# Patient Record
Sex: Male | Born: 1940 | Race: White | Hispanic: No | Marital: Married | Smoking: Never smoker
Health system: Southern US, Community
[De-identification: ages and names within clinical notes are randomized; demographics above are authoritative.]

## PROBLEM LIST (undated history)

## (undated) DIAGNOSIS — G4730 Sleep apnea, unspecified: Secondary | ICD-10-CM

## (undated) DIAGNOSIS — I517 Cardiomegaly: Secondary | ICD-10-CM

## (undated) DIAGNOSIS — M1990 Unspecified osteoarthritis, unspecified site: Secondary | ICD-10-CM

## (undated) DIAGNOSIS — C801 Malignant (primary) neoplasm, unspecified: Secondary | ICD-10-CM

## (undated) DIAGNOSIS — D126 Benign neoplasm of colon, unspecified: Secondary | ICD-10-CM

## (undated) DIAGNOSIS — A159 Respiratory tuberculosis unspecified: Secondary | ICD-10-CM

## (undated) DIAGNOSIS — E785 Hyperlipidemia, unspecified: Secondary | ICD-10-CM

## (undated) DIAGNOSIS — I10 Essential (primary) hypertension: Secondary | ICD-10-CM

## (undated) DIAGNOSIS — F419 Anxiety disorder, unspecified: Secondary | ICD-10-CM

## (undated) HISTORY — DX: Cardiomegaly: I51.7

## (undated) HISTORY — DX: Benign neoplasm of colon, unspecified: D12.6

## (undated) HISTORY — PX: SKIN CANCER EXCISION: SHX779

## (undated) HISTORY — DX: Anxiety disorder, unspecified: F41.9

---

## 2002-06-04 ENCOUNTER — Encounter: Admission: RE | Admit: 2002-06-04 | Discharge: 2002-06-04 | Payer: Self-pay | Admitting: Family Medicine

## 2002-06-04 ENCOUNTER — Encounter: Payer: Self-pay | Admitting: Family Medicine

## 2003-05-11 ENCOUNTER — Emergency Department (HOSPITAL_COMMUNITY): Admission: EM | Admit: 2003-05-11 | Discharge: 2003-05-11 | Payer: Self-pay | Admitting: Emergency Medicine

## 2003-05-16 ENCOUNTER — Ambulatory Visit (HOSPITAL_COMMUNITY): Admission: RE | Admit: 2003-05-16 | Discharge: 2003-05-16 | Payer: Self-pay | Admitting: Otolaryngology

## 2008-01-26 ENCOUNTER — Ambulatory Visit (HOSPITAL_BASED_OUTPATIENT_CLINIC_OR_DEPARTMENT_OTHER): Admission: RE | Admit: 2008-01-26 | Discharge: 2008-01-26 | Payer: Self-pay | Admitting: Family Medicine

## 2008-01-30 ENCOUNTER — Ambulatory Visit: Payer: Self-pay | Admitting: Internal Medicine

## 2009-01-03 ENCOUNTER — Encounter: Payer: Self-pay | Admitting: Gastroenterology

## 2009-01-04 ENCOUNTER — Encounter: Payer: Self-pay | Admitting: Gastroenterology

## 2009-01-09 ENCOUNTER — Encounter: Payer: Self-pay | Admitting: Gastroenterology

## 2009-01-16 ENCOUNTER — Encounter (INDEPENDENT_AMBULATORY_CARE_PROVIDER_SITE_OTHER): Payer: Self-pay | Admitting: *Deleted

## 2009-01-24 ENCOUNTER — Encounter (INDEPENDENT_AMBULATORY_CARE_PROVIDER_SITE_OTHER): Payer: Self-pay | Admitting: *Deleted

## 2009-01-24 ENCOUNTER — Ambulatory Visit: Payer: Self-pay | Admitting: Internal Medicine

## 2009-02-06 ENCOUNTER — Encounter: Payer: Self-pay | Admitting: Gastroenterology

## 2009-02-09 ENCOUNTER — Ambulatory Visit: Payer: Self-pay | Admitting: Gastroenterology

## 2009-02-13 ENCOUNTER — Encounter: Payer: Self-pay | Admitting: Gastroenterology

## 2009-05-10 ENCOUNTER — Encounter: Admission: RE | Admit: 2009-05-10 | Discharge: 2009-05-10 | Payer: Self-pay | Admitting: Endocrinology

## 2009-05-24 ENCOUNTER — Encounter: Admission: RE | Admit: 2009-05-24 | Discharge: 2009-05-24 | Payer: Self-pay | Admitting: Endocrinology

## 2010-03-06 NOTE — Procedures (Signed)
Summary: Colonoscopy  Patient: Danny Hunter Note: All result statuses are Final unless otherwise noted.  Tests: (1) Colonoscopy (COL)   COL Colonoscopy           DONE     [facility]     [address]     [place], [place]  [address]           COLONOSCOPY PROCEDURE REPORT           PATIENT:  Danny Hunter, Danny Hunter  MR#:  [id number]     BIRTHDATE:  May 25, 1940, 68 yrs. old  GENDER:  male           ENDOSCOPIST:  Barbette Hair. Arlyce Dice, MD     Referred by:           PROCEDURE DATE:  02/09/2009     PROCEDURE:  Colonoscopy with snare polypectomy     ASA CLASS:  Class II     INDICATIONS:  Routine Risk Screening           MEDICATIONS:   Fentanyl 50 mcg IV, Versed 5 mg IV           DESCRIPTION OF PROCEDURE:   After the risks benefits and     alternatives of the procedure were thoroughly explained, informed     consent was obtained.  Digital rectal exam was performed and     revealed no abnormalities.   The LB CF-H180AL [id number] endoscope     was introduced through the anus and advanced to the cecum, which     was identified by both the appendix and ileocecal valve, without     limitations.  The quality of the prep was excellent, using     MoviPrep.  The instrument was then slowly withdrawn as the colon     was fully examined.     <<PROCEDUREIMAGES>>           FINDINGS:  A sessile polyp was found in the cecum. It was 2 mm in     size. Polyp was snared without cautery. Retrieval was successful     (see image4). snare polyp  A sessile polyp was found in the     proximal transverse colon. It was 3 mm in size. Polyp was snared     without cautery. Retrieval was successful (see image5). snare     polyp  Scattered diverticula were found (see image7, image10, and     image9). Scattered, moderate diverticulosis from sigmoid to     transverse colon  This was otherwise a normal examination of the     colon (see image1, image2, image13, image14, and image15).     Retroflexed views in the  rectum revealed no abnormalities.    The     scope was then withdrawn from the patient and the procedure     completed.           COMPLICATIONS:  None           ENDOSCOPIC IMPRESSION:     1) 2 mm sessile polyp in the cecum     2) 3 mm sessile polyp in the proximal transverse colon     3) Diverticula, scattered     4) Otherwise normal examination     RECOMMENDATIONS:     1) If the polyp(s) removed today are proven to be adenomatous     (pre-cancerous) polyps, you will need a repeat colonoscopy in 5     years. Otherwise you should continue to follow colorectal  cancer     screening guidelines for "routine risk" patients with colonoscopy     in 10 years.           REPEAT EXAM:  You will receive a letter from Dr. Arlyce Dice in 1-2     weeks, after reviewing the final pathology, with followup     recommendations.           ______________________________     Barbette Hair Arlyce Dice, MD           CC:  Adela Lank, MD           n.     Rosalie Doctor:   Barbette Hair. Kaplan at 02/09/2009 10:58 AM           Page 2 of 3   Fritz, [place], [phone]  Note: An exclamation mark (!) indicates a result that was not dispersed into the flowsheet. Document Creation Date: 02/09/2009 1:00 PM _______________________________________________________________________  (1) Order result status: Final Collection or observation date-time: 02/09/2009 10:50 Requested date-time:  Receipt date-time:  Reported date-time:  Referring Physician:   Ordering Physician: Melvia Heaps [phone]) Specimen Source:  Source: Launa Grill Order Number: [id number] Lab site:   Appended Document: Colonoscopy     Procedures Next Due Date:    Colonoscopy: 02/2014

## 2010-03-06 NOTE — Letter (Signed)
Summary: Patient's history/[facility]  Patient's history/[facility]   Imported By: Sherian Rein 02/23/2009 10:20:53  _____________________________________________________________________  External Attachment:    Type:   Image     Comment:   External Document

## 2010-03-06 NOTE — Letter (Signed)
Summary: Patient Notice- Polyp Results  [facility]  [address] [place], [place] [address]   Phone: [phone]  Fax: [phone]        February 13, 2009 MRN: [id number]    Danny Hunter [address] [place], [place]  [address]    Dear [name],  I am pleased to inform you that the colon polyp(s) removed during your recent colonoscopy was (were) found to be benign (no cancer detected) upon pathologic examination.  I recommend you have a repeat colonoscopy examination in 5_ years to look for recurrent polyps, as having colon polyps increases your risk for having recurrent polyps or even colon cancer in the future.  Should you develop new or worsening symptoms of abdominal pain, bowel habit changes or bleeding from the rectum or bowels, please schedule an evaluation with either your primary care physician or with me.  Additional information/recommendations:  __ No further action with gastroenterology is needed at this time. Please      follow-up with your primary care physician for your other healthcare      needs.  __ Please call [phone] to schedule a return visit to review your      situation.  __ Please keep your follow-up visit as already scheduled.  _x_ Continue treatment plan as outlined the day of your exam.  Please call us if you are having persistent problems or have questions about your condition that have not been fully answered at this time.  Sincerely,  Louis Meckel MD  This letter has been electronically signed by your physician.  Appended Document: Patient Notice- Polyp Results Letter mailed 01.12.11

## 2010-03-06 NOTE — Miscellaneous (Signed)
Summary: movi  Clinical Lists Changes  Medications: Added new medication of MOVIPREP 100 GM  SOLR (PEG-KCL-NACL-NASULF-NA ASC-C) As per prep instructions. - Signed Rx of MOVIPREP 100 GM  SOLR (PEG-KCL-NACL-NASULF-NA ASC-C) As per prep instructions.;  #1 x 0;  Signed;  Entered by: Harlow Mares CMA (AAMA);  Authorized by: Louis Meckel MD;  Method used: Electronically to [facility] [id number] [address] [place], [place], [place]  [address], Ph: [phone], Fax: [phone]    Prescriptions: MOVIPREP 100 GM  SOLR (PEG-KCL-NACL-NASULF-NA ASC-C) As per prep instructions.  #1 x 0   Entered by:   Harlow Mares CMA (AAMA)   Authorized by:   Louis Meckel MD   Signed by:   Harlow Mares CMA (AAMA) on 02/06/2009   Method used:   Electronically to        [facility] [address] (retail)       [address] [place], [place]  [address]       Ph: [phone]       Fax: [phone]   RxID:   [id number]

## 2010-03-06 NOTE — Letter (Signed)
Summary: [facility]  [facility]   Imported By: Sherian Rein 02/23/2009 10:19:40  _____________________________________________________________________  External Attachment:    Type:   Image     Comment:   External Document

## 2010-03-20 ENCOUNTER — Other Ambulatory Visit: Payer: Self-pay | Admitting: Otolaryngology

## 2010-03-29 ENCOUNTER — Other Ambulatory Visit: Payer: Self-pay | Admitting: Endocrinology

## 2010-03-29 DIAGNOSIS — E041 Nontoxic single thyroid nodule: Secondary | ICD-10-CM

## 2010-04-22 LAB — GLUCOSE, CAPILLARY
Glucose-Capillary: 133 mg/dL — ABNORMAL HIGH (ref 70–99)
Glucose-Capillary: 160 mg/dL — ABNORMAL HIGH (ref 70–99)

## 2010-05-14 ENCOUNTER — Ambulatory Visit
Admission: RE | Admit: 2010-05-14 | Discharge: 2010-05-14 | Disposition: A | Discharge status: Home / Self Care | Payer: Medicare Other | Source: Ambulatory Visit | Attending: Endocrinology | Admitting: Endocrinology

## 2010-05-14 DIAGNOSIS — E041 Nontoxic single thyroid nodule: Secondary | ICD-10-CM

## 2010-06-22 NOTE — Procedures (Signed)
NAME:  Danny Hunter, Danny Hunter             ACCOUNT NO.:  [id number]   MEDICAL RECORD NO.:  [id number]          PATIENT TYPE:  OUT   LOCATION:  SLEEP CENTER                 FACILITY[facility]   PHYSICIAN:  Clinton D. Maple Hudson, MD, FCCP, FACPDATE OF BIRTH:  July 22, 1939   DATE OF STUDY:                            NOCTURNAL POLYSOMNOGRAM   REFERRING PHYSICIAN:  Evelena Peat, M.D.   INDICATION FOR STUDY:  Hypersomnia with sleep apnea.   EPWORTH SLEEPINESS SCORE:  Epworth sleepiness score 7/24.  BMI 36.  Weight 230 pounds.  Height 67 inches.  Neck 17.5 inches.   MEDICATIONS:  Home medications charted and reviewed.   SLEEP ARCHITECTURE:  Split study protocol.  During the diagnostic phase,  total sleep time 121 minutes with sleep efficiency 82%.  Stage I was  14%.  Stage II 86%.  Stage III and REM were absent.  Sleep latency 16  minutes.  Wake after sleep onset 12.5 minutes.  Arousal index 38.2  indicating increased EEG arousal.  No bedtime medication was taken.   RESPIRATORY DATA:  Split study protocol.  Apnea-hypopnea index (AHI)  37.2 per hour.  A total of 75 events were scored including 29  obstructive apneas, 1 mixed apnea, and 45 hypopneas.  Events were non-  positional, but somewhat more frequent while supine.  CPAP was titrated  to optimal control at 7 CWP, AHI 0 per hour.  The technician noted a few  residual breakthrough events, mostly hypopneas, and tried higher  pressures, which did not improve control and appeared to be associated  with increasing nasal congestion.  A pressure of 11 CWP provided an AHI  of 2.6 per hour during 46 minutes of measurement and is the recommended  pressure for initial home trial.  Patient chose a medium ResMed Quattro  mask with heated humidifier.   OXYGEN DATA:  Moderately loud snoring with oxygen desaturation to a  nadir of 84% on room air before CPAP.  After CPAP control, mean oxygen  saturation held 91.9% on room air.   CARDIAC DATA:  Sinus rhythm  with occasional PVC.   MOVEMENT/PARASOMNIA:  No significant movement disturbance.  Bathroom x1.   IMPRESSION/RECOMMENDATION:  1. Moderate obstructive sleep apnea/hypopnea syndrome, apnea-hypopnea      index 37.2 per hour.  Non-positional events somewhat more frequent      while supine.  Moderately loud snoring with oxygen desaturation to      a nadir of 84%.  2. Successful continuous positive airway pressure titration to an      initial recommended home pressure trial at 11 centimeters of water      pressure, apnea-hypopnea index 2.6 per hour.  He chose a medium      ResMed Quattro full face mask with heated humidifier.  There were a      few residual breakthrough events, but higher pressures were not      helpful and this seemed associated with increasing nasal      congestion.  3. Mean oxygen saturation on room air with continuous positive airway      pressure control was only 91.9% with maximum 96% and minimum 84%      while  on continuous positive airway pressure.  A total of 0.5      minutes was recorded with saturation less than 88% on room air      while wearing continuous positive airway pressure.  Question      underlying cardiopulmonary disease based on low mean oxygen      saturation, but noting that awake room air baseline saturation was      97% at rest.      Clinton D. Maple Hudson, MD, [facility], FACP  Diplomate, Biomedical engineer of Sleep Medicine  Electronically Signed     CDY/MEDQ  D:  01/31/2008 10:47:50  T:  02/01/2008 01:15:27  Job:  [id number]

## 2011-07-18 ENCOUNTER — Other Ambulatory Visit (HOSPITAL_COMMUNITY): Payer: Self-pay | Admitting: Orthopedic Surgery

## 2011-07-18 DIAGNOSIS — M25519 Pain in unspecified shoulder: Secondary | ICD-10-CM

## 2011-07-26 ENCOUNTER — Ambulatory Visit (HOSPITAL_COMMUNITY)
Admission: RE | Admit: 2011-07-26 | Discharge: 2011-07-26 | Disposition: A | Discharge status: Home / Self Care | Payer: Medicare Other | Source: Ambulatory Visit | Attending: Orthopedic Surgery | Admitting: Orthopedic Surgery

## 2011-07-26 DIAGNOSIS — M25519 Pain in unspecified shoulder: Secondary | ICD-10-CM

## 2011-07-26 DIAGNOSIS — M19019 Primary osteoarthritis, unspecified shoulder: Secondary | ICD-10-CM | POA: Insufficient documentation

## 2011-09-26 ENCOUNTER — Other Ambulatory Visit: Payer: Self-pay | Admitting: Orthopedic Surgery

## 2011-10-08 ENCOUNTER — Encounter (HOSPITAL_COMMUNITY): Payer: Self-pay | Admitting: Respiratory Therapy

## 2011-10-17 ENCOUNTER — Encounter (HOSPITAL_COMMUNITY): Admission: RE | Admit: 2011-10-17 | Payer: Medicare Other | Source: Ambulatory Visit

## 2011-10-17 ENCOUNTER — Encounter (HOSPITAL_COMMUNITY)
Admission: RE | Admit: 2011-10-17 | Discharge: 2011-10-17 | Disposition: A | Discharge status: Home / Self Care | Payer: Medicare Other | Source: Ambulatory Visit | Attending: Orthopedic Surgery | Admitting: Orthopedic Surgery

## 2011-10-17 ENCOUNTER — Encounter (HOSPITAL_COMMUNITY): Payer: Self-pay

## 2011-10-17 HISTORY — DX: Hyperlipidemia, unspecified: E78.5

## 2011-10-17 HISTORY — DX: Sleep apnea, unspecified: G47.30

## 2011-10-17 HISTORY — DX: Essential (primary) hypertension: I10

## 2011-10-17 LAB — URINALYSIS, ROUTINE W REFLEX MICROSCOPIC
Glucose, UA: NEGATIVE mg/dL
Hgb urine dipstick: NEGATIVE
Specific Gravity, Urine: 1.017 (ref 1.005–1.030)
Urobilinogen, UA: 1 mg/dL (ref 0.0–1.0)

## 2011-10-17 LAB — COMPREHENSIVE METABOLIC PANEL
ALT: 17 U/L (ref 0–53)
Alkaline Phosphatase: 64 U/L (ref 39–117)
CO2: 30 mEq/L (ref 19–32)
GFR calc Af Amer: >90 mL/min (ref >90)
GFR calc non Af Amer: 89 mL/min — ABNORMAL LOW (ref >90)
Glucose, Bld: 144 mg/dL — ABNORMAL HIGH (ref 70–99)
Potassium: 4.1 mEq/L (ref 3.5–5.1)
Sodium: 144 mEq/L (ref 135–145)
Total Bilirubin: 0.5 mg/dL (ref 0.3–1.2)

## 2011-10-17 LAB — CBC WITH DIFFERENTIAL/PLATELET
Basophils Absolute: 0 10*3/uL (ref 0.0–0.1)
Basophils Relative: 0 % (ref 0–1)
Eosinophils Absolute: 0.2 10*3/uL (ref 0.0–0.7)
Eosinophils Relative: 2 % (ref 0–5)
HCT: 45.7 % (ref 39.0–52.0)
MCH: 31 pg (ref 26.0–34.0)
MCHC: 33.3 g/dL (ref 30.0–36.0)
Monocytes Absolute: 0.7 10*3/uL (ref 0.1–1.0)
Monocytes Relative: 10 % (ref 3–12)
Neutro Abs: 4.9 10*3/uL (ref 1.7–7.7)
RDW: 13.4 % (ref 11.5–15.5)

## 2011-10-17 LAB — APTT: aPTT: 29 seconds (ref 24–37)

## 2011-10-17 LAB — ABO/RH: ABO/RH(D): O POS

## 2011-10-17 LAB — SURGICAL PCR SCREEN: Staphylococcus aureus: NEGATIVE

## 2011-10-17 NOTE — Pre-Procedure Instructions (Signed)
19 Danny Hunter  10/17/2011   Your procedure is scheduled on:  Sept 17, 2013  Report to [facility] at 5:30 AM.  Call this number if you have problems the morning of surgery: [phone]   Remember:   Do not eat food:After Midnight.    Take these medicines the morning of surgery with A SIP OF WATER: metoprolol   Do not wear jewelry, make-up or nail polish.  Do not wear lotions, powders, or perfumes. You may wear deodorant.  Do not shave 48 hours prior to surgery. Men may shave face and neck.  Do not bring valuables to the hospital.  Contacts, dentures or bridgework may not be worn into surgery.  Leave suitcase in the car. After surgery it may be brought to your room.  For patients admitted to the hospital, checkout time is 11:00 AM the day of discharge.   Patients discharged the day of surgery will not be allowed to drive home.  Name and phone number of your driver:   Special Instructions: Incentive Spirometry - Practice and bring it with you on the day of surgery. and CHG Shower Use Special Wash: 1/2 bottle night before surgery and 1/2 bottle morning of surgery.   Please read over the following fact sheets that you were given: Pain Booklet, Coughing and Deep Breathing, Blood Transfusion Information and Surgical Site Infection Prevention

## 2011-10-18 NOTE — Consult Note (Signed)
Anesthesia Chart Review:   Patient is a 71 year old male scheduled for right total shoulder arthroplasty on 10/22/11 by Dr. Ave Filter.  History includes non-smoker, obesity, HTN, HLD, DM2, OSA.    Patient was seen by Dr. Viann Fish for a pre-operative Cardiology evaluation on 09/30/11.  EKG then showed NSR.  He ordered an echo that showed moderate LVH with normal systolic function, EF 60%, mild left atrial enlargement, Doppler evidence of grade 1 diastolic dysfunction, mild aortic root enlargement.  He felt patient would be acceptable to proceed and at average risk for a man his age.  CXR done on 09/20/11 [facility], Dr. Juleen China) showed stable mild cardiomegaly. 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 No active lung disease.   Labs noted.  Anticipate patient can proceed as planned.  Shonna Chock, PA-C

## 2011-10-21 MED ORDER — DEXTROSE 5 % IV SOLN
3.0000 g | INTRAVENOUS | Status: AC
  Administered 2011-10-22: 3 g via INTRAVENOUS
  Filled 2011-10-21: qty 3000

## 2011-10-22 ENCOUNTER — Inpatient Hospital Stay (HOSPITAL_COMMUNITY): Payer: Medicare Other | Admitting: Vascular Surgery

## 2011-10-22 ENCOUNTER — Encounter (HOSPITAL_COMMUNITY): Payer: Self-pay | Admitting: Vascular Surgery

## 2011-10-22 ENCOUNTER — Observation Stay (HOSPITAL_COMMUNITY): Payer: Medicare Other

## 2011-10-22 ENCOUNTER — Encounter (HOSPITAL_COMMUNITY)
Admission: RE | Disposition: A | Discharge status: Home / Self Care | Payer: Self-pay | Source: Ambulatory Visit | Attending: Orthopedic Surgery

## 2011-10-22 ENCOUNTER — Inpatient Hospital Stay (HOSPITAL_COMMUNITY)
Admission: RE | Admit: 2011-10-22 | Discharge: 2011-10-24 | DRG: 484 | Disposition: A | Discharge status: Home / Self Care | Payer: Medicare Other | Source: Ambulatory Visit | Attending: Orthopedic Surgery | Admitting: Orthopedic Surgery

## 2011-10-22 ENCOUNTER — Encounter (HOSPITAL_COMMUNITY): Payer: Self-pay | Admitting: *Deleted

## 2011-10-22 DIAGNOSIS — E119 Type 2 diabetes mellitus without complications: Secondary | ICD-10-CM | POA: Diagnosis present

## 2011-10-22 DIAGNOSIS — M19019 Primary osteoarthritis, unspecified shoulder: Secondary | ICD-10-CM

## 2011-10-22 DIAGNOSIS — Z01812 Encounter for preprocedural laboratory examination: Secondary | ICD-10-CM

## 2011-10-22 DIAGNOSIS — I10 Essential (primary) hypertension: Secondary | ICD-10-CM | POA: Diagnosis present

## 2011-10-22 DIAGNOSIS — E785 Hyperlipidemia, unspecified: Secondary | ICD-10-CM | POA: Diagnosis present

## 2011-10-22 DIAGNOSIS — G4730 Sleep apnea, unspecified: Secondary | ICD-10-CM | POA: Diagnosis present

## 2011-10-22 DIAGNOSIS — Z96619 Presence of unspecified artificial shoulder joint: Secondary | ICD-10-CM

## 2011-10-22 DIAGNOSIS — E669 Obesity, unspecified: Secondary | ICD-10-CM | POA: Diagnosis present

## 2011-10-22 DIAGNOSIS — Z79899 Other long term (current) drug therapy: Secondary | ICD-10-CM

## 2011-10-22 DIAGNOSIS — Z01811 Encounter for preprocedural respiratory examination: Secondary | ICD-10-CM

## 2011-10-22 HISTORY — PX: TOTAL SHOULDER ARTHROPLASTY: SHX126

## 2011-10-22 LAB — GLUCOSE, CAPILLARY
Glucose-Capillary: 121 mg/dL — ABNORMAL HIGH (ref 70–99)
Glucose-Capillary: 152 mg/dL — ABNORMAL HIGH (ref 70–99)
Glucose-Capillary: 176 mg/dL — ABNORMAL HIGH (ref 70–99)
Glucose-Capillary: 141 mg/dL — ABNORMAL HIGH (ref 70–99)

## 2011-10-22 SURGERY — ARTHROPLASTY, SHOULDER, TOTAL
Anesthesia: General | Site: Shoulder | Laterality: Right | Wound class: Clean

## 2011-10-22 MED ORDER — OXYCODONE HCL 5 MG PO TABS: 5.0000 mg | ORAL_TABLET | Freq: Once | ORAL | Status: DC | PRN

## 2011-10-22 MED ORDER — MIDAZOLAM HCL 5 MG/5ML IJ SOLN
INTRAMUSCULAR | Status: DC | PRN
  Administered 2011-10-22: 1 mg via INTRAVENOUS

## 2011-10-22 MED ORDER — LOSARTAN POTASSIUM 50 MG PO TABS
50.0000 mg | ORAL_TABLET | Freq: Every day | ORAL | Status: DC
  Administered 2011-10-22 – 2011-10-24 (×3): 50 mg via ORAL
  Filled 2011-10-22 (×3): qty 1

## 2011-10-22 MED ORDER — ACETAMINOPHEN 10 MG/ML IV SOLN
INTRAVENOUS | Status: DC | PRN
  Administered 2011-10-22: 1000 mg via INTRAVENOUS

## 2011-10-22 MED ORDER — ACETAMINOPHEN 325 MG PO TABS: 650.0000 mg | ORAL_TABLET | Freq: Four times a day (QID) | ORAL | Status: DC | PRN

## 2011-10-22 MED ORDER — ONDANSETRON HCL 4 MG/2ML IJ SOLN
4.0000 mg | Freq: Four times a day (QID) | INTRAMUSCULAR | Status: DC | PRN
  Administered 2011-10-23: 4 mg via INTRAVENOUS
  Filled 2011-10-22: qty 2

## 2011-10-22 MED ORDER — OXYCODONE HCL 5 MG/5ML PO SOLN: 5.0000 mg | Freq: Once | ORAL | Status: DC | PRN

## 2011-10-22 MED ORDER — LIDOCAINE HCL (CARDIAC) 20 MG/ML IV SOLN
INTRAVENOUS | Status: DC | PRN
  Administered 2011-10-22: 80 mg via INTRAVENOUS

## 2011-10-22 MED ORDER — ONDANSETRON HCL 4 MG/2ML IJ SOLN
INTRAMUSCULAR | Status: DC | PRN
  Administered 2011-10-22: 4 mg via INTRAVENOUS

## 2011-10-22 MED ORDER — ROCURONIUM BROMIDE 100 MG/10ML IV SOLN
INTRAVENOUS | Status: DC | PRN
  Administered 2011-10-22: 50 mg via INTRAVENOUS

## 2011-10-22 MED ORDER — POVIDONE-IODINE 7.5 % EX SOLN
Freq: Once | CUTANEOUS | Status: DC
  Filled 2011-10-22: qty 118

## 2011-10-22 MED ORDER — LACTATED RINGERS IV SOLN
INTRAVENOUS | Status: DC | PRN
  Administered 2011-10-22 (×3): via INTRAVENOUS

## 2011-10-22 MED ORDER — MORPHINE SULFATE 2 MG/ML IJ SOLN
2.0000 mg | INTRAMUSCULAR | Status: DC | PRN
  Administered 2011-10-22: 2 mg via INTRAVENOUS

## 2011-10-22 MED ORDER — EPHEDRINE SULFATE 50 MG/ML IJ SOLN
INTRAMUSCULAR | Status: DC | PRN
  Administered 2011-10-22: 5 mg via INTRAVENOUS
  Administered 2011-10-22: 10 mg via INTRAVENOUS
  Administered 2011-10-22 (×2): 5 mg via INTRAVENOUS

## 2011-10-22 MED ORDER — ATORVASTATIN CALCIUM 20 MG PO TABS
20.0000 mg | ORAL_TABLET | Freq: Every day | ORAL | Status: DC
  Administered 2011-10-22 – 2011-10-23 (×2): 20 mg via ORAL
  Filled 2011-10-22 (×3): qty 1

## 2011-10-22 MED ORDER — ONDANSETRON HCL 4 MG PO TABS: 4.0000 mg | ORAL_TABLET | Freq: Four times a day (QID) | ORAL | Status: DC | PRN

## 2011-10-22 MED ORDER — HYDROMORPHONE HCL PF 1 MG/ML IJ SOLN: 0.2500 mg | INTRAMUSCULAR | Status: DC | PRN

## 2011-10-22 MED ORDER — MORPHINE SULFATE 2 MG/ML IJ SOLN
INTRAMUSCULAR | Status: AC
  Filled 2011-10-22: qty 1

## 2011-10-22 MED ORDER — SODIUM CHLORIDE 0.9 % IV SOLN
INTRAVENOUS | Status: DC
  Administered 2011-10-22 – 2011-10-23 (×2): via INTRAVENOUS
  Administered 2011-10-24: 125 mL via INTRAVENOUS

## 2011-10-22 MED ORDER — NEOSTIGMINE METHYLSULFATE 1 MG/ML IJ SOLN
INTRAMUSCULAR | Status: DC | PRN
  Administered 2011-10-22: 3 mg via INTRAVENOUS

## 2011-10-22 MED ORDER — FUROSEMIDE 20 MG PO TABS
20.0000 mg | ORAL_TABLET | Freq: Every day | ORAL | Status: DC
  Administered 2011-10-24: 20 mg via ORAL
  Filled 2011-10-22 (×3): qty 1

## 2011-10-22 MED ORDER — METOCLOPRAMIDE HCL 5 MG/ML IJ SOLN
5.0000 mg | Freq: Three times a day (TID) | INTRAMUSCULAR | Status: DC | PRN
  Administered 2011-10-23: 10 mg via INTRAVENOUS
  Filled 2011-10-22: qty 2

## 2011-10-22 MED ORDER — OXYCODONE-ACETAMINOPHEN 5-325 MG PO TABS
1.0000 | ORAL_TABLET | ORAL | Status: DC | PRN
  Administered 2011-10-23 (×2): 2 via ORAL
  Filled 2011-10-22 (×2): qty 2

## 2011-10-22 MED ORDER — 0.9 % SODIUM CHLORIDE (POUR BTL) OPTIME
TOPICAL | Status: DC | PRN
  Administered 2011-10-22: 1000 mL

## 2011-10-22 MED ORDER — PROPOFOL 10 MG/ML IV BOLUS
INTRAVENOUS | Status: DC | PRN
  Administered 2011-10-22: 150 mg via INTRAVENOUS
  Administered 2011-10-22: 40 mg via INTRAVENOUS

## 2011-10-22 MED ORDER — BUPIVACAINE-EPINEPHRINE PF 0.5-1:200000 % IJ SOLN
INTRAMUSCULAR | Status: DC | PRN
  Administered 2011-10-22: 25 mL

## 2011-10-22 MED ORDER — BISACODYL 5 MG PO TBEC: 5.0000 mg | DELAYED_RELEASE_TABLET | Freq: Every day | ORAL | Status: DC | PRN

## 2011-10-22 MED ORDER — FLEET ENEMA 7-19 GM/118ML RE ENEM: 1.0000 | ENEMA | Freq: Once | RECTAL | Status: AC | PRN

## 2011-10-22 MED ORDER — MEPERIDINE HCL 25 MG/ML IJ SOLN: 6.2500 mg | INTRAMUSCULAR | Status: DC | PRN

## 2011-10-22 MED ORDER — DIPHENHYDRAMINE HCL 12.5 MG/5ML PO ELIX: 12.5000 mg | ORAL_SOLUTION | ORAL | Status: DC | PRN

## 2011-10-22 MED ORDER — ACETAMINOPHEN 10 MG/ML IV SOLN
INTRAVENOUS | Status: AC
  Filled 2011-10-22: qty 100

## 2011-10-22 MED ORDER — ZOLPIDEM TARTRATE 5 MG PO TABS: 5.0000 mg | ORAL_TABLET | Freq: Every evening | ORAL | Status: DC | PRN

## 2011-10-22 MED ORDER — CEFAZOLIN SODIUM-DEXTROSE 2-3 GM-% IV SOLR
2.0000 g | Freq: Four times a day (QID) | INTRAVENOUS | Status: AC
Start: 2011-10-22 — End: 2011-10-23
  Administered 2011-10-22 – 2011-10-23 (×3): 2 g via INTRAVENOUS
  Filled 2011-10-22 (×3): qty 50

## 2011-10-22 MED ORDER — ACETAMINOPHEN 650 MG RE SUPP: 650.0000 mg | Freq: Four times a day (QID) | RECTAL | Status: DC | PRN

## 2011-10-22 MED ORDER — HEMOSTATIC AGENTS (NO CHARGE) OPTIME
TOPICAL | Status: DC | PRN
  Administered 2011-10-22: 1

## 2011-10-22 MED ORDER — FENTANYL CITRATE 0.05 MG/ML IJ SOLN
INTRAMUSCULAR | Status: DC | PRN
  Administered 2011-10-22: 50 ug via INTRAVENOUS
  Administered 2011-10-22: 25 ug via INTRAVENOUS
  Administered 2011-10-22 (×2): 50 ug via INTRAVENOUS

## 2011-10-22 MED ORDER — PROMETHAZINE HCL 25 MG/ML IJ SOLN: 6.2500 mg | INTRAMUSCULAR | Status: DC | PRN

## 2011-10-22 MED ORDER — SENNOSIDES-DOCUSATE SODIUM 8.6-50 MG PO TABS: 1.0000 | ORAL_TABLET | Freq: Every evening | ORAL | Status: DC | PRN

## 2011-10-22 MED ORDER — INSULIN ASPART 100 UNIT/ML ~~LOC~~ SOLN
0.0000 [IU] | Freq: Three times a day (TID) | SUBCUTANEOUS | Status: DC
  Administered 2011-10-22: 3 [IU] via SUBCUTANEOUS
  Administered 2011-10-23: 5 [IU] via SUBCUTANEOUS
  Administered 2011-10-23: 3 [IU] via SUBCUTANEOUS
  Administered 2011-10-24: 2 [IU] via SUBCUTANEOUS

## 2011-10-22 MED ORDER — ALUM & MAG HYDROXIDE-SIMETH 200-200-20 MG/5ML PO SUSP: 30.0000 mL | ORAL | Status: DC | PRN

## 2011-10-22 MED ORDER — MENTHOL 3 MG MT LOZG: 1.0000 | LOZENGE | OROMUCOSAL | Status: DC | PRN

## 2011-10-22 MED ORDER — SENNA 8.6 MG PO TABS
1.0000 | ORAL_TABLET | Freq: Two times a day (BID) | ORAL | Status: DC
  Administered 2011-10-22 – 2011-10-24 (×4): 8.6 mg via ORAL
  Filled 2011-10-22 (×6): qty 1

## 2011-10-22 MED ORDER — ASPIRIN EC 325 MG PO TBEC
325.0000 mg | DELAYED_RELEASE_TABLET | Freq: Two times a day (BID) | ORAL | Status: DC
  Filled 2011-10-22 (×6): qty 1

## 2011-10-22 MED ORDER — METOCLOPRAMIDE HCL 10 MG PO TABS: 5.0000 mg | ORAL_TABLET | Freq: Three times a day (TID) | ORAL | Status: DC | PRN

## 2011-10-22 MED ORDER — SODIUM CHLORIDE 0.9 % IR SOLN
Status: DC | PRN
  Administered 2011-10-22: 3000 mL

## 2011-10-22 MED ORDER — METOPROLOL TARTRATE 25 MG PO TABS
25.0000 mg | ORAL_TABLET | Freq: Every day | ORAL | Status: DC
  Administered 2011-10-22 – 2011-10-23 (×2): 25 mg via ORAL
  Filled 2011-10-22 (×2): qty 1

## 2011-10-22 MED ORDER — INSULIN ASPART 100 UNIT/ML ~~LOC~~ SOLN
4.0000 [IU] | Freq: Three times a day (TID) | SUBCUTANEOUS | Status: DC
  Administered 2011-10-23: 4 [IU] via SUBCUTANEOUS

## 2011-10-22 MED ORDER — HYDROCODONE-ACETAMINOPHEN 5-325 MG PO TABS
1.0000 | ORAL_TABLET | ORAL | Status: DC | PRN
  Administered 2011-10-22 – 2011-10-23 (×3): 2 via ORAL
  Filled 2011-10-22 (×3): qty 2

## 2011-10-22 MED ORDER — PHENOL 1.4 % MT LIQD: 1.0000 | OROMUCOSAL | Status: DC | PRN

## 2011-10-22 MED ORDER — GLYCOPYRROLATE 0.2 MG/ML IJ SOLN
INTRAMUSCULAR | Status: DC | PRN
  Administered 2011-10-22: 0.4 mg via INTRAVENOUS

## 2011-10-22 SURGICAL SUPPLY — 69 items
BLADE SAW SAG 73X25 THK (BLADE) ×1
BLADE SAW SGTL 73X25 THK (BLADE) ×1 IMPLANT
BLADE SURG 15 STRL LF DISP TIS (BLADE) ×2 IMPLANT
BLADE SURG 15 STRL SS (BLADE) ×2
BOWL SMART MIX CTS (DISPOSABLE) IMPLANT
CEMENT BONE DEPUY (Cement) ×2 IMPLANT
CHLORAPREP W/TINT 26ML (MISCELLANEOUS) ×2 IMPLANT
CLOTH BEACON ORANGE TIMEOUT ST (SAFETY) ×2 IMPLANT
CLSR STERI-STRIP ANTIMIC 1/2X4 (GAUZE/BANDAGES/DRESSINGS) ×2 IMPLANT
COVER SURGICAL LIGHT HANDLE (MISCELLANEOUS) ×2 IMPLANT
DRAPE INCISE IOBAN 66X45 STRL (DRAPES) ×2 IMPLANT
DRAPE SURG 17X23 STRL (DRAPES) ×2 IMPLANT
DRAPE U-SHAPE 47X51 STRL (DRAPES) ×2 IMPLANT
DRSG ADAPTIC 3X8 NADH LF (GAUZE/BANDAGES/DRESSINGS) ×2 IMPLANT
DRSG MEPILEX BORDER 4X4 (GAUZE/BANDAGES/DRESSINGS) ×2 IMPLANT
DRSG MEPILEX BORDER 4X8 (GAUZE/BANDAGES/DRESSINGS) ×2 IMPLANT
DRSG PAD ABDOMINAL 8X10 ST (GAUZE/BANDAGES/DRESSINGS) ×4 IMPLANT
ELECT BLADE 4.0 EZ CLEAN MEGAD (MISCELLANEOUS) ×2
ELECT REM PT RETURN 9FT ADLT (ELECTROSURGICAL) ×2
ELECTRODE BLDE 4.0 EZ CLN MEGD (MISCELLANEOUS) ×1 IMPLANT
ELECTRODE REM PT RTRN 9FT ADLT (ELECTROSURGICAL) ×1 IMPLANT
EVACUATOR 1/8 PVC DRAIN (DRAIN) ×2 IMPLANT
GLOVE BIO SURGEON STRL SZ7 (GLOVE) ×2 IMPLANT
GLOVE BIO SURGEON STRL SZ7.5 (GLOVE) ×2 IMPLANT
GLOVE BIOGEL PI IND STRL 8 (GLOVE) ×1 IMPLANT
GLOVE BIOGEL PI INDICATOR 8 (GLOVE) ×1
GOWN PREVENTION PLUS LG XLONG (DISPOSABLE) ×2 IMPLANT
GOWN STRL NON-REIN LRG LVL3 (GOWN DISPOSABLE) ×4 IMPLANT
GOWN STRL REIN XL XLG (GOWN DISPOSABLE) ×4 IMPLANT
HANDPIECE INTERPULSE COAX TIP (DISPOSABLE) ×1
HEMOSTAT SNOW SURGICEL 2X4 (HEMOSTASIS) ×2 IMPLANT
HEMOSTAT SURGICEL 2X14 (HEMOSTASIS) ×2 IMPLANT
HOOD PEEL AWAY FACE SHEILD DIS (HOOD) ×4 IMPLANT
KIT BASIN OR (CUSTOM PROCEDURE TRAY) ×4 IMPLANT
KIT ROOM TURNOVER OR (KITS) ×2 IMPLANT
MANIFOLD NEPTUNE II (INSTRUMENTS) ×2 IMPLANT
NEEDLE HYPO 25GX1X1/2 BEV (NEEDLE) ×2 IMPLANT
NEEDLE MAYO TROCAR (NEEDLE) ×2 IMPLANT
NS IRRIG 1000ML POUR BTL (IV SOLUTION) ×2 IMPLANT
PACK SHOULDER (CUSTOM PROCEDURE TRAY) ×2 IMPLANT
PAD ARMBOARD 7.5X6 YLW CONV (MISCELLANEOUS) ×4 IMPLANT
RETRIEVER SUT HEWSON (MISCELLANEOUS) IMPLANT
SET HNDPC FAN SPRY TIP SCT (DISPOSABLE) ×1 IMPLANT
SLING ARM IMMOBILIZER LRG (SOFTGOODS) ×2 IMPLANT
SLING ARM IMMOBILIZER MED (SOFTGOODS) IMPLANT
SMARTMIX MINI TOWER (MISCELLANEOUS) ×2
SPONGE GAUZE 4X4 12PLY (GAUZE/BANDAGES/DRESSINGS) ×2 IMPLANT
SPONGE LAP 18X18 X RAY DECT (DISPOSABLE) ×2 IMPLANT
SPONGE LAP 4X18 X RAY DECT (DISPOSABLE) ×6 IMPLANT
STRIP CLOSURE SKIN 1/2X4 (GAUZE/BANDAGES/DRESSINGS) ×2 IMPLANT
SUCTION FRAZIER TIP 10 FR DISP (SUCTIONS) ×2 IMPLANT
SUPPORT WRAP ARM LG (MISCELLANEOUS) ×2 IMPLANT
SUT ETHIBOND NAB CT1 #1 30IN (SUTURE) ×2 IMPLANT
SUT FIBERWIRE #2 38 T-5 BLUE (SUTURE) ×2
SUT MNCRL AB 4-0 PS2 18 (SUTURE) ×2 IMPLANT
SUT SILK 2 0 TIES 17X18 (SUTURE) ×1
SUT SILK 2-0 18XBRD TIE BLK (SUTURE) ×1 IMPLANT
SUT VIC AB 0 CTB1 27 (SUTURE) ×2 IMPLANT
SUT VIC AB 2-0 CT1 27 (SUTURE) ×1
SUT VIC AB 2-0 CT1 TAPERPNT 27 (SUTURE) ×1 IMPLANT
SUTURE FIBERWR #2 38 T-5 BLUE (SUTURE) ×1 IMPLANT
SYR CONTROL 10ML LL (SYRINGE) ×2 IMPLANT
SYRINGE TOOMEY DISP (SYRINGE) ×2 IMPLANT
TOWEL OR 17X24 6PK STRL BLUE (TOWEL DISPOSABLE) ×2 IMPLANT
TOWEL OR 17X26 10 PK STRL BLUE (TOWEL DISPOSABLE) ×2 IMPLANT
TOWER SMARTMIX MINI (MISCELLANEOUS) ×1 IMPLANT
TRAY FOLEY CATH 14FR (SET/KITS/TRAYS/PACK) IMPLANT
WATER STERILE IRR 1000ML POUR (IV SOLUTION) ×2 IMPLANT
YANKAUER SUCT BULB TIP NO VENT (SUCTIONS) ×2 IMPLANT

## 2011-10-22 NOTE — Progress Notes (Signed)
After assisting pt to bathroom, pt's hemovac drain was accidentally pulled out.  Additional mepilex dressing applied to site, will continue to monitor.

## 2011-10-22 NOTE — Progress Notes (Signed)
RT went to help PT with his home cpap machine after RN called but when RT got to PT room, PT had changed his mind and said he was going to pass on wearing his machine for the night. Rt advised PT if he changed his mind to let us know. RT will continue to monitor.

## 2011-10-22 NOTE — Op Note (Signed)
Procedure(s): TOTAL SHOULDER ARTHROPLASTY Procedure Note  Danny Hunter male 71 y.o. 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 Pain and dysfunction interfered with quality of life and nonoperative treatment with activity modification, NSAIDS and injections failed.     Surgeon: Mable Paris   Assistants: Damita Lack PA-C (Danielle was scrubbed throughout the procedure and was essential in exposure retraction and closure)  Anesthesia: General endotracheal anesthesia with preoperative interscalene block given by the attending anesthesiologist   Procedure Detail  TOTAL SHOULDER ARTHROPLASTY  Findings: Severe arthritis with large osteophytes of the humeral head and several large loose osteochondral fragments. DePuy total shoulder replacement with a size 14 press-fit porcoat stem with a size 52 x 21 eccentric head and a size 56 glenoid. The glenoid was cemented anchor peg glenoid. Excellent stability was noted.  Estimated Blood Loss:  300 mL         Drains: 1 medium hemovac  Blood Given: none          Specimens: none        Complications:  * No complications entered in OR log *         Disposition: PACU - hemodynamically stable.         Condition: stable    Procedure:   The patient was identified in the preoperative holding area where I personally marked the operative extremity after verifying with the patient and consent. He  was taken to the operating room where He was transferred to the   operative table.  The patient received an interscalene block in   the holding area by the attending anesthesiologist.  General anesthesia was induced   in the operating room without complication.  The patient did receive IV  Ancef prior to  the commencement of the procedure.  The patient was   placed in the beach-chair position with the back raised about 30   degrees.  The nonoperative extremity and head and neck were carefully   positioned and padded protecting against neurovascular compromise.  The   left upper extremity was then prepped and draped in the standard sterile   fashion.    The appropriate operative time-out was performed with   Anesthesia, the perioperative staff, as well as myself and we all agreed   that the right side was the correct operative site.  An approximately   10 cm incision was made from the tip of the coracoid to the center point of the   humerus at the level of the axilla.  Dissection was carried down sharply   through subcutaneous tissues and cephalic vein was identified and taken   laterally with the deltoid.  The pectoralis major was taken medially.  The   upper 1 cm of the pectoralis major was released from its attachment on   the humerus.  The clavipectoral fascia was incised just lateral to the   conjoined tendon.  This incision was carried up to but not into the   coracoacromial ligament.  Digital palpation was used to prove   integrity of the axillary nerve which was protected throughout the   procedure.  Musculocutaneous nerve was not palpated in the operative   field.  Conjoined tendon was then retracted gently medially and the   deltoid laterally.  Anterior circumflex humeral vessels were clamped and   coagulated.  The soft tissues overlying the biceps was incised and this   incision was carried across the transverse humeral ligament to the base   of the coracoid.  The biceps was tenodesed to the soft tissue just above   pectoralis major and the remaining portion of the biceps superiorly was   excised.  An osteotomy was performed at the lesser tuberosity and the   subscapularis was freed from the underlying capsule.  Capsule was then   released all the way down to the 6 o'clock  position of the humeral head.   The humeral head was then delivered with simultaneous adduction,   extension and external rotation.  Extensive large humeral osteophytes were removed   and the anatomic neck of the humerus was marked and cut free hand at   approximately 25 degrees retroversion within about 3 mm of the cuff   reflection posteriorly.  The head size was estimated to be a 52 medium   offset.  The humerus was then sequentially reamed going from 6 to 14 by 2 mm incriments. The 14 mm reamer was found to have appropriate cortical contact.  A   box osteotome was then used and a 14-mm broach. At that point, the humeral head was retracted posteriorly with   a Fukuda retractor and the anterior-inferior capsule was excised.   Remaining portion of the capsule was released at the base of the   coracoid.  The remaining biceps anchor and the entire anterior-inferior   labrum was excised.  The posterior labrum was also excised but the   posterior capsule was not released.  The guidepin was placed bicortically with 0 elevated guide.  The reamer was used to ream to concentric bone with punctate bleeding.  This gave an excellent concentric surface.  The center hole was then drilled for an anchor peg glenoid followed by the three peripheral holes and none of the holes   exited the glenoid wall.  I then pulse irrigated these holes and dried   them with Surgicel.   The three peripheral holes were then   pressurized cemented and the anchor peg glenoid was placed and impacted   with an excellent fit.  The glenoid was a 56 component.  The proximal humerus was then again exposed taking care not to displace the glenoid.     Calcar reamer was used.The eccentric 52 x 21 head fit best.  With the trial implantation of the component, there was   approximately 50% posterior translation with immediate snap back to the   anatomic position.  With forward elevation, there was no tendency   towards posterior  subluxation.   The trial was removed and the final implant was prepared on a back table.  The implant was then impacted and   achieved excellent anatomic reconstruction of the proximal humerus. Bone grafting was used medially to elevate the final implant to an anatomic position. #2 Ethibond was placed around the neck prior to impaction.  The joint   was then copiously irrigated with pulse lavage.  The subscapularis and   lesser tuberosity osteotomy were then repaired using 2 #2 Ethibonds   and the #2 Ethibonds around the neck of the implant in a double row type   repair.  One #1 Ethibond was placed at the rotator interval just above   the lesser tuberosity.  After repair of the lesser tuberosity, a medium   Hemovac was placed out  anterolaterally and again copious irrigation was   used.   Skin was closed with 2-0 Vicryl sutures in the deep dermal layer and 4-0 Monocryl in a subcuticular  running fashion.  Sterile dressings were then applied including Steri- Strips, 4x4s, ABDs and tape.  The patient was placed in a sling and allowed to awaken from general anesthesia and taken to the recovery room in stable  condition.      POSTOPERATIVE PLAN:  Early passive range of motion will be allowed with the goal of 40 degrees external rotation and a 140 degrees forward elevation.  No internal rotation at this time.  No active motion of the arm until the lesser tuberosity heals.  The patient will likely be kept in the hospital for 2 days and then discharged home.

## 2011-10-22 NOTE — Anesthesia Postprocedure Evaluation (Signed)
  Anesthesia Post-op Note  Patient: Danny Hunter  Procedure(s) Performed: Procedure(s) (LRB) with comments: TOTAL SHOULDER ARTHROPLASTY (Right) - right total shoulder arthroplasty  Patient Location: PACU  Anesthesia Type: GA combined with regional for post-op pain  Level of Consciousness: awake  Airway and Oxygen Therapy: Patient Spontanous Breathing  Post-op Pain: mild  Post-op Assessment: Post-op Vital signs reviewed  Post-op Vital Signs: stable  Complications: No apparent anesthesia complications

## 2011-10-22 NOTE — Transfer of Care (Signed)
Immediate Anesthesia Transfer of Care Note  Patient: Danny Hunter  Procedure(s) Performed: Procedure(s) (LRB) with comments: TOTAL SHOULDER ARTHROPLASTY (Right) - right total shoulder arthroplasty  Patient Location: PACU  Anesthesia Type: GA combined with regional for post-op pain  Level of Consciousness: awake, alert  and oriented  Airway & Oxygen Therapy: Patient Spontanous Breathing and Patient connected to nasal cannula oxygen  Post-op Assessment: Report given to PACU RN, Post -op Vital signs reviewed and stable and Patient moving all extremities X 4  Post vital signs: Reviewed and stable  Complications: No apparent anesthesia complications

## 2011-10-22 NOTE — Anesthesia Preprocedure Evaluation (Addendum)
Anesthesia Evaluation  Patient identified by MRN, date of birth, ID band Patient awake    Reviewed: Allergy & Precautions, H&P , NPO status , Patient's Chart, lab work & pertinent test results  History of Anesthesia Complications Negative for: history of anesthetic complications  Airway Mallampati: II TM Distance: >3 FB Neck ROM: Full    Dental  (+) Teeth Intact and Dental Advisory Given   Pulmonary sleep apnea ,  breath sounds clear to auscultation        Cardiovascular hypertension, Rhythm:Regular Rate:Normal     Neuro/Psych negative neurological ROS     GI/Hepatic negative GI ROS, Neg liver ROS,   Endo/Other  diabetes  Renal/GU negative Renal ROS     Musculoskeletal   Abdominal (+) + obese,   Peds  Hematology   Anesthesia Other Findings   Reproductive/Obstetrics                          Anesthesia Physical Anesthesia Plan  ASA: III  Anesthesia Plan: General   Post-op Pain Management:    Induction: Intravenous  Airway Management Planned: Oral ETT  Additional Equipment:   Intra-op Plan:   Post-operative Plan: Extubation in OR  Informed Consent: I have reviewed the patients History and Physical, chart, labs and discussed the procedure including the risks, benefits and alternatives for the proposed anesthesia with the patient or authorized representative who has indicated his/her understanding and acceptance.   Dental advisory given  Plan Discussed with: CRNA and Surgeon  Anesthesia Plan Comments:         Anesthesia Quick Evaluation

## 2011-10-22 NOTE — Anesthesia Procedure Notes (Signed)
Anesthesia Regional Block:  Interscalene brachial plexus block  Pre-Anesthetic Checklist: ,, timeout performed, Correct Patient, Correct Site, Correct Laterality, Correct Procedure, Correct Position, site marked, Risks and benefits discussed, at surgeon's request and post-op pain management  Laterality: Upper and Right  Prep: chloraprep and alcohol swabs       Needles:  Injection technique: Single-shot  Needle Type: Stimulator Needle - 40      Needle Gauge: 22 and 22 G  Needle insertion depth: 2 cm   Additional Needles:  Procedures: nerve stimulator Interscalene brachial plexus block  Nerve Stimulator or Paresthesia:  Response: Twitch elicited, 0.5 mA, 0.3 ms,   Additional Responses:   Narrative:  Start time: 10/22/2011 7:00 AM End time: 10/22/2011 7:15 AM Injection made incrementally with aspirations every 5 mL.  Performed by: Personally  Anesthesiologist: Alma Friendly, MD  Additional Notes: Block assessed prior to start of surgery  Interscalene brachial plexus block

## 2011-10-22 NOTE — H&P (Signed)
Danny Hunter is an 71 y.o. male.   Chief Complaint: R shoulder pain HPI: Endstage R shoulder osteoarthritis, failed conservative management of activity mod, NSAIDs, injections.  Past Medical History  Diagnosis Date  . Hypertension   . Diabetes mellitus   . Hyperlipemia   . Sleep apnea     History reviewed. No pertinent past surgical history.  History reviewed. No pertinent family history. Social History:  reports that he has never smoked. He does not have any smokeless tobacco history on file. He reports that he does not drink alcohol or use illicit drugs.  Allergies: No Known Allergies  Medications Prior to Admission  Medication Sig Dispense Refill  . exenatide (BYETTA) 10 MCG/0.04ML SOLN Inject 10 mcg into the skin daily.      . furosemide (LASIX) 20 MG tablet Take 20 mg by mouth daily.      Marland Kitchen losartan (COZAAR) 50 MG tablet Take 50 mg by mouth daily.      . metFORMIN (GLUCOPHAGE) 500 MG tablet Take 500 mg by mouth 3 (three) times daily.      . metoprolol tartrate (LOPRESSOR) 25 MG tablet Take 25 mg by mouth daily.      . rosuvastatin (CRESTOR) 10 MG tablet Take 10 mg by mouth daily.        Results for orders placed during the hospital encounter of 10/22/11 (from the past 48 hour(s))  GLUCOSE, CAPILLARY     Status: Abnormal   Collection Time   10/22/11  6:10 AM      Component Value Range Comment   Glucose-Capillary 121 (*) 70 - 99 mg/dL    No results found.  Review of Systems  All other systems reviewed and are negative.    Blood pressure 137/75, pulse 61, temperature 97.9 F (36.6 C), temperature source Oral, resp. rate 18, SpO2 94.00%. Physical Exam  Constitutional: He is oriented to person, place, and time. He appears well-developed and well-nourished.  HENT:  Head: Atraumatic.  Eyes: EOM are normal.  Cardiovascular: Intact distal pulses.   Respiratory: Effort normal.  Musculoskeletal:       Right shoulder: He exhibits decreased range of motion and pain.    Neurological: He is alert and oriented to person, place, and time.  Skin: Skin is warm and dry.  Psychiatric: He has a normal mood and affect.     Assessment/Plan Endstage R shoulder osteoarthritis, failed conservative management of activity mod, NSAIDs, injections. Plan R total shoulder arthroplasty Risks / benefits of surgery discussed Consent on chart  NPO for OR Preop antibiotics   Danny Hunter 10/22/2011, 7:25 AM

## 2011-10-23 ENCOUNTER — Encounter (HOSPITAL_COMMUNITY): Payer: Self-pay | Admitting: Orthopedic Surgery

## 2011-10-23 LAB — HEMOGLOBIN A1C: Hgb A1c MFr Bld: 6.6 % — ABNORMAL HIGH (ref <5.7)

## 2011-10-23 LAB — CBC
MCH: 30.7 pg (ref 26.0–34.0)
Platelets: 180 10*3/uL (ref 150–400)
RBC: 4.23 MIL/uL (ref 4.22–5.81)
RDW: 13.5 % (ref 11.5–15.5)
WBC: 11.5 10*3/uL — ABNORMAL HIGH (ref 4.0–10.5)

## 2011-10-23 LAB — GLUCOSE, CAPILLARY
Glucose-Capillary: 152 mg/dL — ABNORMAL HIGH (ref 70–99)
Glucose-Capillary: 152 mg/dL — ABNORMAL HIGH (ref 70–99)

## 2011-10-23 LAB — BASIC METABOLIC PANEL
CO2: 27 mEq/L (ref 19–32)
Calcium: 9.5 mg/dL (ref 8.4–10.5)
Chloride: 102 mEq/L (ref 96–112)
Creatinine, Ser: 0.68 mg/dL (ref 0.50–1.35)
GFR calc Af Amer: >90 mL/min (ref >90)
Sodium: 138 mEq/L (ref 135–145)

## 2011-10-23 MED ORDER — METOPROLOL TARTRATE 25 MG PO TABS
25.0000 mg | ORAL_TABLET | Freq: Two times a day (BID) | ORAL | Status: DC
  Administered 2011-10-24 (×2): 25 mg via ORAL
  Filled 2011-10-23 (×3): qty 1

## 2011-10-23 NOTE — Progress Notes (Addendum)
Occupational Therapy Evaluation Patient Details Name: Danny Hunter MRN: [id number] DOB: 04/08/1940 Today's Date: 10/23/2011 Time: 6962-9528 OT Time Calculation (min): 44 min  OT Assessment / Plan / Recommendation Clinical Impression  Pt s/p right total shoulder arthroplasty thus affecting PLOF.  Pt limited during eval due to nausea.  Will benefit from acute OT services to address below problem list in prep for d/c home with wife. Recommend further progress shoulder rehab as recommended by MD at f/u appt.    OT Assessment  Patient needs continued OT Services    Follow Up Recommendations   (further shoulder rehab as recommended by MD at f/u)    Barriers to Discharge      Equipment Recommendations  None recommended by OT    Recommendations for Other Services    Frequency  Min 3X/week    Precautions / Restrictions Precautions Precautions: Shoulder Required Braces or Orthoses:  (RUE sling)   Pertinent Vitals/Pain Pt with n/v just prior to session.  RN at bedside and provided nausea meds at start of session, but pt limited by pain and nausea throughout.     ADL  Eating/Feeding: Performed;Set up (assist to cut food) Where Assessed - Eating/Feeding: Chair Upper Body Dressing: Maximal assistance;Performed Where Assessed - Upper Body Dressing: Unsupported sitting Equipment Used:  (RUE sling) Transfers/Ambulation Related to ADLs: Pt up in chair upon arrival.  Transfer/ambulation not assessed due to nausea and dizziness. ADL Comments: Educated pt and wife on UB bathing/dressing technique. Pt donned/doffed RUE sling with max assist. Educated pt's wife on donning/doffing sling and correct positioning. Provided pt with HEP (FF and ER) handout and demonstrated for pt.  Pt full reclined in chair and performed ER AAROM 1 set x10reps.  Pt unable to tolerate full set of  FF (completed 5 reps) due to nausea and pain. Plan to return this afternoon to continue HEP.      OT Diagnosis: Generalized  weakness;Acute pain  OT Problem List: Decreased strength;Decreased range of motion;Decreased knowledge of precautions;Pain;Impaired UE functional use OT Treatment Interventions: Self-care/ADL training;Therapeutic exercise;Therapeutic activities;Patient/family education   OT Goals Acute Rehab OT Goals OT Goal Formulation: With patient Time For Goal Achievement: 10/30/11 Potential to Achieve Goals: Good ADL Goals Pt Will Perform Upper Body Bathing: with min assist;with caregiver independent in assisting;Sitting, chair;Sitting, edge of bed ADL Goal: Upper Body Bathing - Progress: Goal set today Pt Will Perform Upper Body Dressing: with min assist;Sitting, chair;Sitting, bed;with caregiver independent in assisting ADL Goal: Upper Body Dressing - Progress: Goal set today Pt Will Transfer to Toilet: with supervision;Ambulation;Comfort height toilet ADL Goal: Toilet Transfer - Progress: Goal set today Additional ADL Goal #1: Pt will don/doff RUE sling with min assist with caregiver independent in assisting. ADL Goal: Additional Goal #1 - Progress: Goal set today Arm Goals Additional Arm Goal #1: Pt will independently perform Right shoulder HEP (Dr Ave Filter FF and ER handout). Arm Goal: Additional Goal #1 - Progress: Goal set today  Visit Information  Last OT Received On: 10/23/11 Assistance Needed: +1    Subjective Data      Prior Functioning  Vision/Perception  Home Living Lives With: Spouse Available Help at Discharge: Family;Available 24 hours/day Type of Home: House Home Layout: Two level Alternate Level Stairs-Number of Steps: 12 Bathroom Shower/Tub: Walk-in shower Prior Function Level of Independence: Independent Driving: Yes Vocation: Retired Musician: No difficulties Dominant Hand: Left      Cognition  Overall Cognitive Status: Appears within functional limits for tasks assessed/performed Arousal/Alertness: Awake/alert Orientation  Level: Oriented  X4 / Intact Behavior During Session: [facility] for tasks performed    Extremity/Trunk Assessment Right Upper Extremity Assessment RUE ROM/Strength/Tone: Deficits RUE ROM/Strength/Tone Deficits: ROM shoulder limited.  ER AAROM ~20 (using cane). Elbow, wrist, digits WFL. RUE Sensation: Deficits RUE Sensation Deficits: digits slightly numb Left Upper Extremity Assessment LUE ROM/Strength/Tone: Within functional levels   Mobility  Shoulder Instructions  Bed Mobility Bed Mobility: Not assessed Transfers Transfers: Not assessed   Donning/doffing sling/immobilizer: Maximal assistance Correct positioning of sling/immobilizer: Maximal assistance ROM for elbow, wrist and digits of operated UE: Modified independent (increased time)   Exercise Shoulder Exercises Shoulder Flexion: AAROM;Right;5 reps;Supine Shoulder External Rotation: AAROM;Right;10 reps;Supine Elbow Flexion: AROM;Right;10 reps;Seated Elbow Extension: AROM;Right;10 reps;Seated Wrist Flexion: AROM;Right;10 reps;Seated Wrist Extension: AROM;Right;10 reps;Seated Digit Composite Flexion: AROM;Right;10 reps;Seated Composite Extension: AROM;Right;10 reps;Seated   Balance     End of Session OT - End of Session Equipment Utilized During Treatment:  (RUE sling) Activity Tolerance: Patient limited by pain (n/v) Patient left: in chair;with call bell/phone within reach;with family/visitor present Nurse Communication: Mobility status  GO    10/23/2011 Cipriano Mile OTR/L Pager [phone] Office [phone]  Cipriano Mile 10/23/2011, 11:59 AM

## 2011-10-23 NOTE — Progress Notes (Signed)
I was told inn report that pt had a home cpap in room. Went by to check if there was anything he needed help with. Pt told me he sent his cpap home today by his wife because he would be leaving in the morning and didn't need it tonight.

## 2011-10-23 NOTE — Progress Notes (Signed)
PATIENT ID: Danny Hunter   1 Day Post-Op Procedure(s) (LRB): TOTAL SHOULDER ARTHROPLASTY (Right)  Subjective: Doing well. Pain is well controlled. Complains of dizziness when sitting up, no nausea or vomiting. No chest pain or shortness of breath. Has not yet met with OT.  Objective:  Filed Vitals:   10/23/11 0641  BP: 150/74  Pulse: 102  Temp: 98.6 F (37 C)  Resp: 18     Awake, alert and oriented Lying comfortably in bed.  R UE dressings c/d/i Wiggles fingers, able to activate r/m/u nerves. Unable to access firing of deltoid.  Labs:  No results found for this basename: HGB:5 in the last 72 hoursNo results found for this basename: WBC:2,RBC:2,HCT:2,PLT:2 in the last 72 hoursNo results found for this basename: NA:2,K:2,CL:2,CO2:2,BUN:2,CREATININE:2,GLUCOSE:2,CALCIUM:2 in the last 72 hours  Assessment and Plan: Pain is well controlled, continue current pain management Will meet with OT today to learn exercises. Dizzy- will continue to monitor, waiting for labs back Likely home tomorrow.  VTE proph: SCDs, ASA 325mg  BID

## 2011-10-23 NOTE — Progress Notes (Signed)
UR COMPLETED  

## 2011-10-23 NOTE — Progress Notes (Signed)
Occupational Therapy Treatment Patient Details Name: Danny Hunter MRN: [id number] DOB: Jun 17, 1940 Today's Date: 10/23/2011 Time: 4098-1191 OT Time Calculation (min): 24 min  OT Assessment / Plan / Recommendation Comments on Treatment Session Pt tolerated shoulder exercises much better this afternoon (pre-medicated by RN prior to session).  Pt requires cueing for correct technique during exercises.  Will benefit from one more session to ensure correct HEP technique and caregiver independent in assisting with RUE sling.    Follow Up Recommendations   (further shoulder rehab as recommended by MD at f/u)    Barriers to Discharge       Equipment Recommendations  None recommended by OT    Recommendations for Other Services    Frequency Min 3X/week   Plan Discharge plan remains appropriate    Precautions / Restrictions Precautions Precautions: Shoulder Required Braces or Orthoses:  (RUE sling)   Pertinent Vitals/Pain See vitals    ADL  Upper Body Dressing: Simulated;Moderate assistance Where Assessed - Upper Body Dressing: Unsupported sitting Toilet Transfer: Simulated;Min guard Toilet Transfer Method: Sit to Barista:  (bed) Equipment Used:  (RUE sling) Transfers/Ambulation Related to ADLs: Min guard for safety with IV line and also due to c/o slight dizziness. ADL Comments: Pt is slightly implusive during transfers and mobility and requires verbal cueing to slow down.  Pt sat EOB during RUE sling donning/doffing with mod assist.  Educated wife further on correct positioning and donning/doffing technique for sling, and she would benefit from further education.      OT Diagnosis:    OT Problem List:   OT Treatment Interventions:     OT Goals ADL Goals Pt Will Perform Upper Body Dressing: with min assist;Sitting, chair;Sitting, bed;with caregiver independent in assisting ADL Goal: Upper Body Dressing - Progress: Progressing toward goals Pt Will  Transfer to Toilet: with supervision;Ambulation;Comfort height toilet ADL Goal: Toilet Transfer - Progress: Goal set today Additional ADL Goal #1: Pt will don/doff RUE sling with min assist with caregiver independent in assisting. ADL Goal: Additional Goal #1 - Progress: Progressing toward goals Arm Goals Additional Arm Goal #1: Pt will independently perform Right shoulder HEP (Dr Ave Filter FF and ER handout). Arm Goal: Additional Goal #1 - Progress: Progressing toward goals  Visit Information  Last OT Received On: 10/23/11 Assistance Needed: +1    Subjective Data      Prior Functioning       Cognition  Overall Cognitive Status: Appears within functional limits for tasks assessed/performed Arousal/Alertness: Awake/alert Orientation Level: Oriented X4 / Intact Behavior During Session: [facility] for tasks performed Cognition - Other Comments: Slightly impulsive but still WFL.    Mobility  Shoulder Instructions Bed Mobility Bed Mobility: Supine to Sit;Sitting - Scoot to [place] of Bed;Sit to Supine Supine to Sit: 4: Min guard;HOB flat Sitting - Scoot to [place] of Bed: 5: Supervision Sit to Supine: 4: Min guard;HOB flat Details for Bed Mobility Assistance: min guard for safety to prevent excessive movement of right shoulder Transfers Transfers: Sit to Stand;Stand to Sit Sit to Stand: 4: Min guard;From bed;From chair/3-in-1;With upper extremity assist;With armrests Stand to Sit: 4: Min guard;To bed;To chair/3-in-1;With armrests;With upper extremity assist Details for Transfer Assistance: min guard for safety   Donning/doffing sling/immobilizer: Moderate assistance Correct positioning of sling/immobilizer: Moderate assistance   Exercises  Shoulder Exercises Shoulder Flexion: AAROM;Right;10 reps;Supine Shoulder External Rotation: AAROM;Right;10 reps;Supine   Balance     End of Session OT - End of Session Equipment Utilized During Treatment:  (RUE sling)  Activity Tolerance: Patient  tolerated treatment well Patient left: in chair;with call bell/phone within reach;with family/visitor present Nurse Communication: Mobility status  GO    10/23/2011 Cipriano Mile OTR/L Pager [phone] Office [phone]  Cipriano Mile 10/23/2011, 3:09 PM

## 2011-10-24 DIAGNOSIS — M19019 Primary osteoarthritis, unspecified shoulder: Secondary | ICD-10-CM

## 2011-10-24 MED ORDER — OXYCODONE-ACETAMINOPHEN 5-325 MG PO TABS: 1.0000 | ORAL_TABLET | ORAL | Status: DC | PRN

## 2011-10-24 NOTE — Progress Notes (Signed)
PATIENT ID: Danny Hunter   2 Days Post-Op Procedure(s) (LRB): TOTAL SHOULDER ARTHROPLASTY (Right)  Subjective: Doing well. Pain is well controlled. Denies dizziness, nausea and vomiting. Ready to go home.   Objective:  Filed Vitals:   10/24/11 0556  BP: 148/92  Pulse: 100  Temp: 99.1 F (37.3 C)  Resp: 18    Awake, alert and oriented  Sitting comfortably in bed.  R UE dressings removed today, steris intact. Incision is benign, no erythema, warmth Wiggles fingers, able to activate r/m/u nerves. Has some deltoid strength.   Labs:   [facility] 10/23/11 0850  HGB 13.0   Basename 10/23/11 0850  WBC 11.5*  RBC 4.23  HCT 38.9*  PLT 180   Basename 10/23/11 0850  NA 138  K 3.6  CL 102  CO2 27  BUN 10  CREATININE 0.68  GLUCOSE 161*  CALCIUM 9.5    Assessment and Plan: Pain is well controlled, continue current pain management Learned exercises from OT and showed them to me today Dizziness, nausea and vomiting are resolved  Home today with Percocet 5-325 1-2 po q4-6 hrs prn pain  VTE proph: SCDs. ASA 325 mg BID

## 2011-10-24 NOTE — Discharge Summary (Signed)
Patient ID: Danny Hunter MRN: [id number] DOB/AGE: 72-12-1940 72 y.o.  Admit date: 10/22/2011 Discharge date: 10/24/2011  Admission Diagnoses:  Principal Problem:  *Shoulder arthritis   Discharge Diagnoses:  Same  Past Medical History  Diagnosis Date  . Hypertension   . Diabetes mellitus   . Hyperlipemia   . Sleep apnea     Surgeries: Procedure(s): TOTAL SHOULDER ARTHROPLASTY on 10/22/2011   Consultants:    Discharged Condition: Improved  Hospital Course: Danny Hunter is an 71 y.o. male who was admitted 10/22/2011 for operative treatment ofShoulder arthritis. Patient has severe unremitting pain that affects sleep, daily activities, and work/hobbies. After pre-op clearance the patient was taken to the operating room on 10/22/2011 and underwent  Procedure(s): TOTAL SHOULDER ARTHROPLASTY.    Patient was given perioperative antibiotics: Anti-infectives     Start     Dose/Rate Route Frequency Ordered Stop   10/22/11 1230   ceFAZolin (ANCEF) IVPB 2 g/50 mL premix        2 g 100 mL/hr over 30 Minutes Intravenous Every 6 hours 10/22/11 1219 10/23/11 0055   10/21/11 1427   ceFAZolin (ANCEF) 3 g in dextrose 5 % 50 mL IVPB        3 g 160 mL/hr over 30 Minutes Intravenous 60 min pre-op 10/21/11 1427 10/22/11 0747           Patient was given sequential compression devices, early ambulation, and ASA 325mg  BID to prevent DVT.  Patient benefited maximally from hospital stay and there were no complications.    Recent vital signs: Patient Vitals for the past 24 hrs:  BP Temp Temp src Pulse Resp SpO2  10/24/11 0556 148/92 mmHg 99.1 F (37.3 C) Oral 100  18  93 %  10/24/11 0020 125/60 mmHg - - 76  - -  10/23/11 2102 138/62 mmHg 98.3 F (36.8 C) Oral 74  18  93 %  10/23/11 1400 123/62 mmHg 98 F (36.7 C) - 75  16  94 %     Recent laboratory studies:  Basename 10/23/11 0850  WBC 11.5*  HGB 13.0  HCT 38.9*  PLT 180  NA 138  K 3.6  CL 102  CO2 27  BUN 10    CREATININE 0.68  GLUCOSE 161*  INR --  CALCIUM 9.5     Discharge Medications:     Medication List     As of 10/24/2011  7:37 AM    TAKE these medications         exenatide 10 MCG/0.04ML Soln   Commonly known as: BYETTA   Inject 10 mcg into the skin daily.      furosemide 20 MG tablet   Commonly known as: LASIX   Take 20 mg by mouth daily.      losartan 50 MG tablet   Commonly known as: COZAAR   Take 50 mg by mouth daily.      metFORMIN 500 MG tablet   Commonly known as: GLUCOPHAGE   Take 500 mg by mouth 3 (three) times daily.      metoprolol tartrate 25 MG tablet   Commonly known as: LOPRESSOR   Take 25 mg by mouth daily.      oxyCODONE-acetaminophen 5-325 MG per tablet   Commonly known as: PERCOCET/ROXICET   Take 1-2 tablets by mouth every 4 (four) hours as needed for pain.      rosuvastatin 10 MG tablet   Commonly known as: CRESTOR   Take 10 mg by mouth daily.  Diagnostic Studies: Dg Shoulder Right Port  2011-11-01  *RADIOLOGY REPORT*  Clinical Data: Status post shoulder arthroplasty  PORTABLE RIGHT SHOULDER - 2+ VIEW  Comparison:  07/26/2011 CT  Findings: Interval right shoulder arthroplasty with humeral hardware in place.  There is a surgical drain projecting over the proximal right humeral shaft and lateral soft tissues.  There is a surgical clip projecting over the glenoid. Sclerosis of the glenoid rim.  No periprosthetic lucency.  Hypoaerated right lung.  IMPRESSION: Interval right shoulder arthroplasty.   Original Report Authenticated By: Waneta Martins, M.D.     Disposition: Final discharge disposition not confirmed      Discharge Orders    Future Orders Please Complete By Expires   Diet - low sodium heart healthy      Call MD / Call 911      Comments:   If you experience chest pain or shortness of breath, CALL 911 and be transported to the hospital emergency room.  If you develope a fever above 101 F, pus (white drainage) or increased  drainage or redness at the wound, or calf pain, call your surgeon's office.   Constipation Prevention      Comments:   Drink plenty of fluids.  Prune juice may be helpful.  You may use a stool softener, such as Colace (over the counter) 100 mg twice a day.  Use MiraLax (over the counter) for constipation as needed.   Increase activity slowly as tolerated         Follow-up Information    Follow up with Mable Paris, MD. Schedule an appointment as soon as possible for a visit in 2 weeks.   Contact information:   [facility] MEDICINE [address] [place] [address] [phone]           Signed: Jiles Harold 10/24/2011, 7:37 AM

## 2011-10-24 NOTE — Progress Notes (Signed)
Occupational Therapy Treatment Patient Details Name: Danny Hunter MRN: [id number] DOB: 13-May-1940 Today's Date: 10/24/2011 Time: 4098-1191 OT Time Calculation (min): 26 min  OT Assessment / Plan / Recommendation Comments on Treatment Session PT progressing well this session and is at adequate level for d/c home    Follow Up Recommendations   (further shoulder rehab as recommended by MD at f/u)    Barriers to Discharge       Equipment Recommendations  None recommended by OT    Recommendations for Other Services    Frequency Min 3X/week   Plan Discharge plan remains appropriate    Precautions / Restrictions Precautions Precautions: Shoulder Required Braces or Orthoses:  (Rue shoulder sling) Restrictions Weight Bearing Restrictions: Yes RUE Weight Bearing: Non weight bearing   Pertinent Vitals/Pain Facial grimace with exercises Reports "I'm okay " when asked    ADL  Toilet Transfer: Simulated;Supervision/safety Toilet Transfer Method: Sit to Barista: Regular height toilet Toileting - Clothing Manipulation and Hygiene: Performed;Supervision/safety Where Assessed - Toileting Clothing Manipulation and Hygiene: Sit to stand from 3-in-1 or toilet Transfers/Ambulation Related to ADLs: Pt ambulating S level this session ADL Comments: Pt return demonstrated from memory FF and ER exercises taught by OT Eileen Stanford. Pt educated on the importance of each exercise and its okay to do more mini session due to pain than the recommend 10 reps x3 sets if pain is too high. But so educated that this recommend exercise x3 sets by OT Eileen Stanford is the goal to achieve. Pt agreeable. Pt demonstrates don/ doff sling with min v/c for alignment of sling. Pt reeducated on bathing and positioning.    OT Diagnosis:    OT Problem List:   OT Treatment Interventions:     OT Goals Acute Rehab OT Goals OT Goal Formulation: With patient Time For Goal Achievement: 10/30/11 Potential to  Achieve Goals: Good ADL Goals Pt Will Perform Upper Body Bathing: with min assist;with caregiver independent in assisting;Sitting, chair;Sitting, edge of bed ADL Goal: Upper Body Bathing - Progress: Progressing toward goals Pt Will Perform Upper Body Dressing: with min assist;Sitting, chair;Sitting, bed;with caregiver independent in assisting ADL Goal: Upper Body Dressing - Progress: Progressing toward goals Pt Will Transfer to Toilet: with supervision;Ambulation;Comfort height toilet ADL Goal: Toilet Transfer - Progress: Met Additional ADL Goal #1: Pt will don/doff RUE sling with min assist with caregiver independent in assisting. ADL Goal: Additional Goal #1 - Progress: Met Arm Goals Additional Arm Goal #1: Pt will independently perform Right shoulder HEP (Dr Ave Filter FF and ER handout). Arm Goal: Additional Goal #1 - Progress: Met  Visit Information  Last OT Received On: 10/24/11 Assistance Needed: +1    Subjective Data      Prior Functioning       Cognition  Overall Cognitive Status: Appears within functional limits for tasks assessed/performed Arousal/Alertness: Awake/alert Orientation Level: Oriented X4 / Intact Behavior During Session: [facility] for tasks performed    Mobility  Shoulder Instructions Bed Mobility Supine to Sit: 6: Modified independent (Device/Increase time);With rails Sitting - Scoot to Edge of Bed: 6: Modified independent (Device/Increase time) Details for Bed Mobility Assistance: [facility] for d/c home Transfers Sit to Stand: 5: Supervision;From bed Stand to Sit: 5: Supervision;To chair/3-in-1   Donning/doffing shirt without moving shoulder: Supervision/safety Method for sponge bathing under operated UE: Supervision/safety Donning/doffing sling/immobilizer: Supervision/safety Correct positioning of sling/immobilizer: Supervision/safety ROM for elbow, wrist and digits of operated UE: Modified independent Sling wearing schedule (on at all times/off for ADL's):  Modified  independent   Exercises  Shoulder Exercises Shoulder Flexion: AAROM;Right;10 reps;Supine Shoulder External Rotation: AAROM;Right;10 reps;Supine Elbow Flexion: AROM;Right;10 reps;Seated Elbow Extension: AROM;Right;10 reps;Seated Wrist Flexion: AROM;Right;10 reps;Seated Wrist Extension: AROM;Right;10 reps;Seated Digit Composite Flexion: AROM;Right;10 reps;Seated Composite Extension: AROM;Right;10 reps;Seated   Balance     End of Session OT - End of Session Activity Tolerance: Patient tolerated treatment well Patient left: in chair;with call bell/phone within reach;with family/visitor present Nurse Communication: Mobility status  GO     Harrel Carina [facility] 10/24/2011, 9:00 AM Pager: [phone]

## 2012-04-30 ENCOUNTER — Encounter (HOSPITAL_BASED_OUTPATIENT_CLINIC_OR_DEPARTMENT_OTHER): Payer: Self-pay | Admitting: *Deleted

## 2012-04-30 NOTE — Progress Notes (Signed)
[employer] DME download for CPAP compliance.

## 2013-03-31 ENCOUNTER — Other Ambulatory Visit: Payer: Self-pay | Admitting: Dermatology

## 2013-10-19 ENCOUNTER — Encounter: Payer: Self-pay | Admitting: Gastroenterology

## 2014-03-01 ENCOUNTER — Encounter: Payer: Self-pay | Admitting: *Deleted

## 2014-03-18 ENCOUNTER — Encounter: Payer: Self-pay | Admitting: Gastroenterology

## 2014-05-20 ENCOUNTER — Other Ambulatory Visit: Payer: Self-pay | Admitting: Dermatology

## 2014-06-25 ENCOUNTER — Emergency Department (HOSPITAL_COMMUNITY)
Admission: EM | Admit: 2014-06-25 | Discharge: 2014-06-26 | Disposition: A | Discharge status: Home / Self Care | Payer: Medicare Other | Attending: Emergency Medicine | Admitting: Emergency Medicine

## 2014-06-25 ENCOUNTER — Encounter (HOSPITAL_COMMUNITY): Payer: Self-pay | Admitting: *Deleted

## 2014-06-25 DIAGNOSIS — M799 Soft tissue disorder, unspecified: Secondary | ICD-10-CM | POA: Diagnosis not present

## 2014-06-25 DIAGNOSIS — Z86018 Personal history of other benign neoplasm: Secondary | ICD-10-CM | POA: Diagnosis not present

## 2014-06-25 DIAGNOSIS — Z79899 Other long term (current) drug therapy: Secondary | ICD-10-CM | POA: Diagnosis not present

## 2014-06-25 DIAGNOSIS — E119 Type 2 diabetes mellitus without complications: Secondary | ICD-10-CM | POA: Diagnosis not present

## 2014-06-25 DIAGNOSIS — I10 Essential (primary) hypertension: Secondary | ICD-10-CM | POA: Diagnosis not present

## 2014-06-25 DIAGNOSIS — R51 Headache: Secondary | ICD-10-CM | POA: Insufficient documentation

## 2014-06-25 DIAGNOSIS — M5382 Other specified dorsopathies, cervical region: Secondary | ICD-10-CM

## 2014-06-25 DIAGNOSIS — Z8669 Personal history of other diseases of the nervous system and sense organs: Secondary | ICD-10-CM | POA: Diagnosis not present

## 2014-06-25 DIAGNOSIS — E785 Hyperlipidemia, unspecified: Secondary | ICD-10-CM | POA: Insufficient documentation

## 2014-06-25 LAB — I-STAT CHEM 8, ED
BUN: 18 mg/dL (ref 6–20)
CHLORIDE: 102 mmol/L (ref 101–111)
CREATININE: 0.8 mg/dL (ref 0.61–1.24)
Calcium, Ion: 1.39 mmol/L — ABNORMAL HIGH (ref 1.13–1.30)
Glucose, Bld: 142 mg/dL — ABNORMAL HIGH (ref 65–99)
HCT: 48 % (ref 39.0–52.0)
Hemoglobin: 16.3 g/dL (ref 13.0–17.0)
Potassium: 3.9 mmol/L (ref 3.5–5.1)
SODIUM: 142 mmol/L (ref 135–145)
TCO2: 24 mmol/L (ref 0–100)

## 2014-06-25 LAB — CBG MONITORING, ED: Glucose-Capillary: 138 mg/dL — ABNORMAL HIGH (ref 65–99)

## 2014-06-25 MED ORDER — BUPIVACAINE HCL (PF) 0.5 % IJ SOLN
10.0000 mL | Freq: Once | INTRAMUSCULAR | Status: AC
  Administered 2014-06-25: 10 mL
  Filled 2014-06-25: qty 10

## 2014-06-25 NOTE — ED Provider Notes (Signed)
CSN: [id number]     Arrival date & time 06/25/14  2046 History   First MD Initiated Contact with Patient 06/25/14 2157     Chief Complaint  Patient presents with  . Hypertension     (Consider location/radiation/quality/duration/timing/severity/associated sxs/prior Treatment) HPI  Danny Hunter is a(n) 74 y.o. male who presents to the Ed with CC of hypertension and neck pain. The patient states he was at the [facility] this past weekend in [place] when he developed sudden on set SOB, diaphoresis, and dizziness. He was found to be hypertensive wit Systolic pressure above 009. The patient was seen in the ED and discharged after obs and two unchanged EKGs. He states that he has been keeping a daily log of his blood pressures and that today it was 381 systolic with diastolic above 829. He came for further evaluation and help with management of his HTN. He attributes his HTN to pain he has on the Right sub occiptal region which is constant, radiates into the scalp and is worse with movement.   Denies fevers, chills, myalgias, arthralgias. Denies DOE, SOB, chest tightness or pressure, radiation to left arm, jaw or back, or diaphoresis. Denies dysuria, flank pain, suprapubic pain, frequency, urgency, or hematuria. Denies headaches, light headedness, weakness, visual disturbances. Denies abdominal pain, nausea, vomiting, diarrhea or constipation.   Past Medical History  Diagnosis Date  . Hypertension   . Diabetes mellitus   . Hyperlipemia   . Sleep apnea   . Tubular adenoma of colon     2011   Past Surgical History  Procedure Laterality Date  . 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 No pertinent family history. History  Substance Use Topics  . Smoking status: Never Smoker   . Smokeless  tobacco: Not on file  . Alcohol Use: No    Review of Systems  Ten systems reviewed and are negative for acute change, except as noted in the HPI.    Allergies  Review of patient's allergies indicates no known allergies.  Home Medications   Prior to Admission medications   Medication Sig Start Date End Date Taking? Authorizing Provider  furosemide (LASIX) 20 MG tablet Take 20 mg by mouth daily.   Yes Historical Provider, MD  losartan (COZAAR) 100 MG tablet Take 100 mg by mouth daily.  03/29/14  Yes Historical Provider, MD  metoprolol tartrate (LOPRESSOR) 25 MG tablet Take 25 mg by mouth 2 (two) times daily.    Yes Historical Provider, MD  rosuvastatin (CRESTOR) 20 MG tablet Take 10 mg by mouth daily with supper.   Yes Historical Provider, MD  metFORMIN (GLUCOPHAGE) 500 MG tablet  03/29/14   Historical Provider, MD  metFORMIN (GLUCOPHAGE-XR) 500 MG 24 hr tablet Take 1,000 mg by mouth 2 (two) times daily.    Historical Provider, MD  oxyCODONE-acetaminophen (ROXICET) 5-325 MG per tablet Take 1-2 tablets by mouth every 4 (four) hours as needed for pain. Patient not taking: Reported on 06/25/2014 10/24/11   Grier Mitts, PA-C   BP 176/81 mmHg  Pulse 77  Temp(Src) 98 F (36.7 C) (Oral)  Resp 23  SpO2 95% Physical Exam  Constitutional: He appears well-developed and well-nourished. No distress.  HENT:  Head: Normocephalic and atraumatic.    Eyes: Conjunctivae are normal. No scleral icterus.  Neck: Normal range of motion.  Neck supple.  Cardiovascular: Normal rate, regular rhythm and normal heart sounds.   Pulmonary/Chest: Effort normal and breath sounds normal. No respiratory distress.  Abdominal: Soft. There is no tenderness.  Musculoskeletal: He exhibits no edema.  Neurological: He is alert.  Speech is clear and goal oriented, follows commands Major Cranial nerves without deficit, no facial droop Normal strength in upper and lower extremities bilaterally including dorsiflexion  and plantar flexion, strong and equal grip strength Sensation normal to light and sharp touch Moves extremities without ataxia, coordination intact Normal finger to nose and rapid alternating movements Neg romberg, no pronator drift Normal gait Normal heel-shin and balance   Skin: Skin is warm and dry. He is not diaphoretic.  Psychiatric: His behavior is normal.  Nursing note and vitals reviewed.   ED Course  NERVE BLOCK Date/Time: 06/25/2014 10:58 PM Performed by: Margarita Mail Authorized by: Margarita Mail Consent: Verbal consent obtained. Risks and benefits: risks, benefits and alternatives were discussed Patient identity confirmed: provided demographic data Time out: Immediately prior to procedure a "time out" was called to verify the correct patient, procedure, equipment, support staff and site/side marked as required. Indications: pain relief Body area: head Nerve: occipital Laterality: left Patient sedated: no Preparation: Patient was prepped and draped in the usual sterile fashion. Needle gauge: 25 G Location technique: anatomical landmarks Local anesthetic: bupivacaine 0.5% without epinephrine Anesthetic total: 4 ml Outcome: pain improved Patient tolerance: Patient tolerated the procedure well with no immediate complications   (including critical care time) Labs Review Labs Reviewed - No data to display  Imaging Review No results found.   EKG Interpretation   Date/Time:  Saturday Jun 25 2014 23:09:26 EDT Ventricular Rate:  73 PR Interval:  193 QRS Duration: 99 QT Interval:  397 QTC Calculation: 437 R Axis:   46 Text Interpretation:  Sinus rhythm No significant change since last  tracing Confirmed by WARD,  DO, KRISTEN ([id number]) on 06/26/2014 12:00:13 AM      MDM   Final diagnoses:  Essential hypertension  Musculoskeletal disorder involving suboccipital region   Exam consistent with occipital neuralgia, suboccipital musculoskeletal complaints.  Pain resolved with lidocaine injections and occipital nerve block. Ekg is unremarkable. Labs show hyperglycemia.  Filed Vitals:   06/25/14 2330 06/25/14 2345 06/26/14 0000 06/26/14 0015  BP: 150/81 150/82 150/74 148/81  Pulse: 72 71 71 70  Temp:      TempSrc:      Resp: 20 17 17 18   SpO2: 93% 94% 93% 92%    Patient noted to be hypertensive in the emergency department.  No signs of hypertensive urgency.  Discussed with patient the need for close follow-up and management by their primary care physician.      Margarita Mail, PA-C 07/05/14 [place] [place], PA-C 07/05/14 [place], DO 07/06/14 4709

## 2014-06-25 NOTE — ED Notes (Addendum)
Pt in c/o hypertension at home, states he was seen at a hospital in the [place] for same a few days ago and was given medication in ED, pt states he has had trouble recently controlling BP, no medication changes, pt c/o headache

## 2014-06-25 NOTE — ED Notes (Signed)
Pt CBG, 138. Nurse was notified.

## 2014-06-26 NOTE — Discharge Instructions (Signed)
Hypertension °Hypertension, commonly called high blood pressure, is when the force of blood pumping through your arteries is too strong. Your arteries are the blood vessels that carry blood from your heart throughout your body. A blood pressure reading consists of a higher number over a lower number, such as 110/72. The higher number (systolic) is the pressure inside your arteries when your heart pumps. The lower number (diastolic) is the pressure inside your arteries when your heart relaxes. Ideally you want your blood pressure below 120/80. °Hypertension forces your heart to work harder to pump blood. Your arteries may become narrow or stiff. Having hypertension puts you at risk for heart disease, stroke, and other problems.  °RISK FACTORS °Some risk factors for high blood pressure are controllable. Others are not.  °Risk factors you cannot control include:  °· Race. You may be at higher risk if you are African American. °· Age. Risk increases with age. °· Gender. Men are at higher risk than women before age 45 years. After age 65, women are at higher risk than men. °Risk factors you can control include: °· Not getting enough exercise or physical activity. °· Being overweight. °· Getting too much fat, sugar, calories, or salt in your diet. °· Drinking too much alcohol. °SIGNS AND SYMPTOMS °Hypertension does not usually cause signs or symptoms. Extremely high blood pressure (hypertensive crisis) may cause headache, anxiety, shortness of breath, and nosebleed. °DIAGNOSIS  °To check if you have hypertension, your health care provider will measure your blood pressure while you are seated, with your arm held at the level of your heart. It should be measured at least twice using the same arm. Certain conditions can cause a difference in blood pressure between your right and left arms. A blood pressure reading that is higher than normal on one occasion does not mean that you need treatment. If one blood pressure reading  is high, ask your health care provider about having it checked again. °TREATMENT  °Treating high blood pressure includes making lifestyle changes and possibly taking medicine. Living a healthy lifestyle can help lower high blood pressure. You may need to change some of your habits. °Lifestyle changes may include: °· Following the DASH diet. This diet is high in fruits, vegetables, and whole grains. It is low in salt, red meat, and added sugars. °· Getting at least 2½ hours of brisk physical activity every week. °· Losing weight if necessary. °· Not smoking. °· Limiting alcoholic beverages. °· Learning ways to reduce stress. ° If lifestyle changes are not enough to get your blood pressure under control, your health care provider may prescribe medicine. You may need to take more than one. Work closely with your health care provider to understand the risks and benefits. °HOME CARE INSTRUCTIONS °· Have your blood pressure rechecked as directed by your health care provider.   °· Take medicines only as directed by your health care provider. Follow the directions carefully. Blood pressure medicines must be taken as prescribed. The medicine does not work as well when you skip doses. Skipping doses also puts you at risk for problems.   °· Do not smoke.   °· Monitor your blood pressure at home as directed by your health care provider.  °SEEK MEDICAL CARE IF:  °· You think you are having a reaction to medicines taken. °· You have recurrent headaches or feel dizzy. °· You have swelling in your ankles. °· You have trouble with your vision. °SEEK IMMEDIATE MEDICAL CARE IF: °· You develop a severe headache or confusion. °·   You have unusual weakness, numbness, or feel faint.  You have severe chest or abdominal pain.  You vomit repeatedly.  You have trouble breathing. MAKE SURE YOU:   Understand these instructions.  Will watch your condition.  Will get help right away if you are not doing well or get worse. Document  Released: 01/21/2005 Document Revised: 06/07/2013 Document Reviewed: 11/13/2012 [facility] Patient Information 2015 [place], [place]. This information is not intended to replace advice given to you by your health care provider. Make sure you discuss any questions you have with your health care provider.  Occipital Neuralgia Occipital neuralgia is a type of headache that causes episodes of very bad pain in the back of your head. Pain from occipital neuralgia may spread (radiate) to other parts of your head. The pain is usually brief and often goes away after you rest and relax. These headaches may be caused by irritation of the nerves that leave your spinal cord high up in your neck, just below the base of your skull (occipital nerves). Your occipital nerves transmit sensations from the back of your head, the top of your head, and the areas behind your ears. CAUSES Occipital neuralgia can occur without any known cause (primary headache syndrome). In other cases, occipital neuralgia is caused by pressure on or irritation of one of the two occipital nerves. Causes of occipital nerve compression or irritation include:  Wear and tear of the vertebrae in the neck (osteoarthritis).  Neck injury.  Disease of the disks that separate the vertebrae.  Tumors.  Gout.  Infections.  Diabetes.  Swollen blood vessels that put pressure on the occipital nerves.  Muscle spasm in the neck. SIGNS AND SYMPTOMS Pain is the main symptom of occipital neuralgia. It usually starts in the back of the head but may also be felt in other areas supplied by the occipital nerves. Pain is usually on one side but may be on both sides. You may have:   Brief episodes of very bad pain that is burning, stabbing, shocking, or shooting.  Pain behind the eye.  Pain triggered by neck movement or hair brushing.  Scalp tenderness.  Aching in the back of the head between episodes of very bad pain. DIAGNOSIS  Your health care  provider may diagnose occipital neuralgia based on your symptoms and a physical exam. During the exam, the health care provider may push on areas supplied by the occipital nerves to see if they are painful. Some tests may also be done to help in making the diagnosis. These may include:  Imaging studies of the upper spinal cord, such as an MRI or CT scan. These may show compression or spinal cord abnormalities.  Nerve block. You will get an injection of numbing medicine (local anesthetic) near the occipital nerve to see if this relieves pain. TREATMENT  Treatment may begin with simple measures, such as:   Rest.  Massage.  Heat.  Over-the-counter pain relievers. If these measures do not work, you may need other treatments, including:  Medicines such as:  Prescription-strength anti-inflammatory medicines.  Muscle relaxants.  Antiseizure medicines.  Antidepressants.  Steroid injection. This involves injections of local anesthetic and strong anti-inflammatory drugs (steroids).  Pulsed radiofrequency. Wires are implanted to deliver electrical impulses that block pain signals from the occipital nerve.  Physical therapy.  Surgery to relieve nerve pressure. HOME CARE INSTRUCTIONS  Take all medicines as directed by your health care provider.  Avoid activities that cause pain.  Rest when you have an attack of  pain.  Try gentle massage or a heating pad to relieve pain.  Work with a physical therapist to learn stretching exercises you can do at home.  Try a different pillow or sleeping position.  Practice good posture.  Try to stay active. Get regular exercise that does not cause pain. Ask your health care provider to suggest safe exercises for you.  Keep all follow-up visits as directed by your health care provider. This is important. SEEK MEDICAL CARE IF:  Your medicine is not working.  You have new or worsening symptoms. SEEK IMMEDIATE MEDICAL CARE IF:  You have very  bad head pain that is not going away.  You have a sudden change in vision, balance, or speech. MAKE SURE YOU:  Understand these instructions.  Will watch your condition.  Will get help right away if you are not doing well or get worse. Document Released: 01/15/2001 Document Revised: 06/07/2013 Document Reviewed: 01/13/2013 [facility] Patient Information 2015 [place], [place]. This information is not intended to replace advice given to you by your health care provider. Make sure you discuss any questions you have with your health care provider.

## 2014-06-26 NOTE — ED Provider Notes (Signed)
Medical screening examination/treatment/procedure(s) were conducted as a shared visit with non-physician practitioner(s) and myself.  I personally evaluated the patient during the encounter.   EKG Interpretation   Date/Time:  Saturday Jun 25 2014 23:09:26 EDT Ventricular Rate:  73 PR Interval:  193 QRS Duration: 99 QT Interval:  397 QTC Calculation: 437 R Axis:   46 Text Interpretation:  Sinus rhythm No significant change since last  tracing Confirmed by Severino Paolo,  DO, Annie Roseboom [phone]) on 06/26/2014 12:00:13 AM      Pt is a 74 y.o. male with history of hypertension, diabetes, hyperlipidemia who presents to the emergency department with possible headache, neck pain and hypertension.  Patient is neurologically intact. Received occipital block and now pain is gone and his blood pressure has improved. Labs unremarkable. EKG shows no ischemic changes, interval changes or arrhythmia. We'll discharge home with close outpatient follow-up. Reports he is seeing his PCP tomorrow.  [place], DO 06/26/14 0020

## 2014-07-11 DIAGNOSIS — E118 Type 2 diabetes mellitus with unspecified complications: Secondary | ICD-10-CM | POA: Diagnosis not present

## 2014-07-11 DIAGNOSIS — I119 Hypertensive heart disease without heart failure: Secondary | ICD-10-CM | POA: Diagnosis not present

## 2014-07-11 DIAGNOSIS — E789 Disorder of lipoprotein metabolism, unspecified: Secondary | ICD-10-CM | POA: Diagnosis not present

## 2014-07-11 DIAGNOSIS — I10 Essential (primary) hypertension: Secondary | ICD-10-CM | POA: Diagnosis not present

## 2014-07-29 DIAGNOSIS — E668 Other obesity: Secondary | ICD-10-CM | POA: Diagnosis not present

## 2014-07-29 DIAGNOSIS — G4733 Obstructive sleep apnea (adult) (pediatric): Secondary | ICD-10-CM | POA: Diagnosis not present

## 2014-07-29 DIAGNOSIS — I119 Hypertensive heart disease without heart failure: Secondary | ICD-10-CM | POA: Diagnosis not present

## 2014-08-03 DIAGNOSIS — I10 Essential (primary) hypertension: Secondary | ICD-10-CM | POA: Diagnosis not present

## 2014-08-09 ENCOUNTER — Encounter: Payer: Self-pay | Admitting: Gastroenterology

## 2014-08-11 DIAGNOSIS — E118 Type 2 diabetes mellitus with unspecified complications: Secondary | ICD-10-CM | POA: Diagnosis not present

## 2014-08-11 DIAGNOSIS — R6889 Other general symptoms and signs: Secondary | ICD-10-CM | POA: Diagnosis not present

## 2014-08-11 DIAGNOSIS — I10 Essential (primary) hypertension: Secondary | ICD-10-CM | POA: Diagnosis not present

## 2014-08-11 DIAGNOSIS — N390 Urinary tract infection, site not specified: Secondary | ICD-10-CM | POA: Diagnosis not present

## 2014-08-11 DIAGNOSIS — D751 Secondary polycythemia: Secondary | ICD-10-CM | POA: Diagnosis not present

## 2014-09-09 ENCOUNTER — Encounter: Payer: Self-pay | Admitting: Cardiology

## 2014-09-09 DIAGNOSIS — G4733 Obstructive sleep apnea (adult) (pediatric): Secondary | ICD-10-CM | POA: Diagnosis not present

## 2014-09-09 DIAGNOSIS — E668 Other obesity: Secondary | ICD-10-CM | POA: Diagnosis not present

## 2014-09-09 DIAGNOSIS — I493 Ventricular premature depolarization: Secondary | ICD-10-CM | POA: Diagnosis not present

## 2014-09-09 DIAGNOSIS — I119 Hypertensive heart disease without heart failure: Secondary | ICD-10-CM | POA: Diagnosis not present

## 2014-09-13 DIAGNOSIS — E789 Disorder of lipoprotein metabolism, unspecified: Secondary | ICD-10-CM | POA: Diagnosis not present

## 2014-09-13 DIAGNOSIS — I10 Essential (primary) hypertension: Secondary | ICD-10-CM | POA: Diagnosis not present

## 2014-09-13 DIAGNOSIS — M542 Cervicalgia: Secondary | ICD-10-CM | POA: Diagnosis not present

## 2014-09-21 DIAGNOSIS — I119 Hypertensive heart disease without heart failure: Secondary | ICD-10-CM | POA: Diagnosis not present

## 2014-10-11 ENCOUNTER — Ambulatory Visit (AMBULATORY_SURGERY_CENTER): Payer: Self-pay

## 2014-10-11 VITALS — Ht 67.0 in | Wt 219.0 lb

## 2014-10-11 DIAGNOSIS — Z86010 Personal history of colonic polyps: Secondary | ICD-10-CM

## 2014-10-11 MED ORDER — NA SULFATE-K SULFATE-MG SULF 17.5-3.13-1.6 GM/177ML PO SOLN: ORAL | Status: DC

## 2014-10-11 NOTE — Progress Notes (Signed)
Per pt, no allergies to soy or egg products.Pt not taking any weight loss meds or using  O2 at home. 

## 2014-10-13 DIAGNOSIS — M5092 Cervical disc disorder, unspecified, mid-cervical region: Secondary | ICD-10-CM | POA: Diagnosis not present

## 2014-10-13 DIAGNOSIS — M5091 Cervical disc disorder, unspecified,  high cervical region: Secondary | ICD-10-CM | POA: Diagnosis not present

## 2014-10-24 ENCOUNTER — Other Ambulatory Visit: Payer: Self-pay

## 2014-10-24 ENCOUNTER — Ambulatory Visit (AMBULATORY_SURGERY_CENTER): Payer: Medicare Other | Admitting: Gastroenterology

## 2014-10-24 ENCOUNTER — Encounter: Payer: Self-pay | Admitting: Gastroenterology

## 2014-10-24 VITALS — BP 135/71 | HR 55 | Temp 98.3°F | Resp 25

## 2014-10-24 DIAGNOSIS — Z86010 Personal history of colonic polyps: Secondary | ICD-10-CM | POA: Diagnosis present

## 2014-10-24 DIAGNOSIS — K648 Other hemorrhoids: Secondary | ICD-10-CM

## 2014-10-24 DIAGNOSIS — K5730 Diverticulosis of large intestine without perforation or abscess without bleeding: Secondary | ICD-10-CM

## 2014-10-24 DIAGNOSIS — D124 Benign neoplasm of descending colon: Secondary | ICD-10-CM

## 2014-10-24 LAB — GLUCOSE, CAPILLARY
GLUCOSE-CAPILLARY: 161 mg/dL — AB (ref 65–99)
Glucose-Capillary: 190 mg/dL — ABNORMAL HIGH (ref 65–99)

## 2014-10-24 MED ORDER — SODIUM CHLORIDE 0.9 % IV SOLN: 500.0000 mL | INTRAVENOUS | Status: DC

## 2014-10-24 NOTE — Progress Notes (Signed)
Called to room to assist during endoscopic procedure.  Patient ID and intended procedure confirmed with present staff. Received instructions for my participation in the procedure from the performing physician.  

## 2014-10-24 NOTE — Progress Notes (Signed)
Report to PACU, RN, vss, BBS= Clear.  

## 2014-10-24 NOTE — Patient Instructions (Signed)
Discharge instructions given. Handouts on polyps,diverticulosis and hemorrhoids. Resume previous medications. YOU HAD AN ENDOSCOPIC PROCEDURE TODAY AT THE [facility]:   Refer to the procedure report that was given to you for any specific questions about what was found during the examination.  If the procedure report does not answer your questions, please call your gastroenterologist to clarify.  If you requested that your care partner not be given the details of your procedure findings, then the procedure report has been included in a sealed envelope for you to review at your convenience later.  YOU SHOULD EXPECT: Some feelings of bloating in the abdomen. Passage of more gas than usual.  Walking can help get rid of the air that was put into your GI tract during the procedure and reduce the bloating. If you had a lower endoscopy (such as a colonoscopy or flexible sigmoidoscopy) you may notice spotting of blood in your stool or on the toilet paper. If you underwent a bowel prep for your procedure, you may not have a normal bowel movement for a few days.  Please Note:  You might notice some irritation and congestion in your nose or some drainage.  This is from the oxygen used during your procedure.  There is no need for concern and it should clear up in a day or so.  SYMPTOMS TO REPORT IMMEDIATELY:   Following lower endoscopy (colonoscopy or flexible sigmoidoscopy):  Excessive amounts of blood in the stool  Significant tenderness or worsening of abdominal pains  Swelling of the abdomen that is new, acute  Fever of 100F or higher   For urgent or emergent issues, a gastroenterologist can be reached at any hour by calling [phone].   DIET: Your first meal following the procedure should be a small meal and then it is ok to progress to your normal diet. Heavy or fried foods are harder to digest and may make you feel nauseous or bloated.  Likewise, meals heavy in dairy and  vegetables can increase bloating.  Drink plenty of fluids but you should avoid alcoholic beverages for 24 hours.  ACTIVITY:  You should plan to take it easy for the rest of today and you should NOT DRIVE or use heavy machinery until tomorrow (because of the sedation medicines used during the test).    FOLLOW UP: Our staff will call the number listed on your records the next business day following your procedure to check on you and address any questions or concerns that you may have regarding the information given to you following your procedure. If we do not reach you, we will leave a message.  However, if you are feeling well and you are not experiencing any problems, there is no need to return our call.  We will assume that you have returned to your regular daily activities without incident.  If any biopsies were taken you will be contacted by phone or by letter within the next 1-3 weeks.  Please call us at [phone] if you have not heard about the biopsies in 3 weeks.    SIGNATURES/CONFIDENTIALITY: You and/or your care partner have signed paperwork which will be entered into your electronic medical record.  These signatures attest to the fact that that the information above on your After Visit Summary has been reviewed and is understood.  Full responsibility of the confidentiality of this discharge information lies with you and/or your care-partner. 

## 2014-10-24 NOTE — Op Note (Signed)
[facility]  [address] [place], [address]   COLONOSCOPY PROCEDURE REPORT  PATIENT: Danny Hunter, Danny Hunter  MR#: [id number] BIRTHDATE: [birth year] , 49  yrs. old GENDER: male ENDOSCOPIST: Inda Castle, MD REFERRED BE:EFEOFH Wilson Singer, M.D. PROCEDURE DATE:  10/24/2014 PROCEDURE:   Colonoscopy, surveillance and Colonoscopy with snare polypectomy First Screening Colonoscopy - Avg.  risk and is 50 yrs.  old or older - No.  Prior Negative Screening - Now for repeat screening. N/A  History of Adenoma - Now for follow-up colonoscopy & has been > or = to 3 yrs.  Yes hx of adenoma.  Has been 3 or more years since last colonoscopy.  Polyps removed today? 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  The digital rectal exam revealed no abnormalities of the rectum.   The LB [id number]  endoscope was introduced through the anus and advanced to the cecum, which was identified by both the appendix and ileocecal valve. No adverse events experienced.   The quality of the prep was (Suprep was used) excellent.  The instrument was then slowly withdrawn as the colon was fully examined. Estimated blood loss is zero unless otherwise noted in this procedure report.      COLON FINDINGS: A sessile polyp measuring 5 mm in size was found in the descending colon.  A polypectomy was performed with a cold snare.  The resection was complete, the polyp tissue was completely retrieved and sent to histology.   There was mild diverticulosis noted in the ascending colon, transverse colon, and descending colon.   Internal hemorrhoids were found.  Retroflexed views revealed no abnormalities. The time to cecum = 1.8 Withdrawal time = 7.1   The scope was withdrawn and the  procedure completed. COMPLICATIONS: There were no immediate complications.  ENDOSCOPIC IMPRESSION: 1.   Sessile polyp was found in the descending colon; polypectomy was performed with a cold snare 2.   There was mild diverticulosis noted in the ascending colon, transverse colon, and descending colon 3.   Internal hemorrhoids  RECOMMENDATIONS: Given your age, you will not need another colonoscopy for colon cancer screening or polyp surveillance.  These types of tests usually stop around the age 42.  eSigned:  Inda Castle, MD 10/24/2014 10:12 AM   cc:   PATIENT NAME:  Cristian, Davitt MR#: [id number]

## 2014-10-25 ENCOUNTER — Telehealth: Payer: Self-pay | Admitting: *Deleted

## 2014-10-25 NOTE — Telephone Encounter (Signed)
  Follow up Call-  Call back number 10/24/2014  Post procedure Call Back phone  # [phone]  Permission to leave phone message Yes     Patient questions:  Do you have a fever, pain , or abdominal swelling? No. Pain Score  0 *  Have you tolerated food without any problems? Yes.    Have you been able to return to your normal activities? Yes.    Do you have any questions about your discharge instructions: Diet   No. Medications  No. Follow up visit  No.  Do you have questions or concerns about your Care? No.  Actions: * If pain score is 4 or above: No action needed, pain <4.

## 2014-11-01 ENCOUNTER — Encounter: Payer: Self-pay | Admitting: Gastroenterology

## 2014-12-13 DIAGNOSIS — E668 Other obesity: Secondary | ICD-10-CM | POA: Diagnosis not present

## 2014-12-13 DIAGNOSIS — I119 Hypertensive heart disease without heart failure: Secondary | ICD-10-CM | POA: Diagnosis not present

## 2014-12-13 DIAGNOSIS — G4733 Obstructive sleep apnea (adult) (pediatric): Secondary | ICD-10-CM | POA: Diagnosis not present

## 2015-01-03 DIAGNOSIS — Z23 Encounter for immunization: Secondary | ICD-10-CM | POA: Diagnosis not present

## 2015-01-19 DIAGNOSIS — G4733 Obstructive sleep apnea (adult) (pediatric): Secondary | ICD-10-CM | POA: Diagnosis not present

## 2015-02-10 DIAGNOSIS — G4733 Obstructive sleep apnea (adult) (pediatric): Secondary | ICD-10-CM | POA: Diagnosis not present

## 2015-04-06 DIAGNOSIS — D751 Secondary polycythemia: Secondary | ICD-10-CM | POA: Diagnosis not present

## 2015-04-06 DIAGNOSIS — E118 Type 2 diabetes mellitus with unspecified complications: Secondary | ICD-10-CM | POA: Diagnosis not present

## 2015-04-06 DIAGNOSIS — I119 Hypertensive heart disease without heart failure: Secondary | ICD-10-CM | POA: Diagnosis not present

## 2015-04-06 DIAGNOSIS — Z125 Encounter for screening for malignant neoplasm of prostate: Secondary | ICD-10-CM | POA: Diagnosis not present

## 2015-04-06 DIAGNOSIS — G4733 Obstructive sleep apnea (adult) (pediatric): Secondary | ICD-10-CM | POA: Diagnosis not present

## 2015-04-18 DIAGNOSIS — I10 Essential (primary) hypertension: Secondary | ICD-10-CM | POA: Diagnosis not present

## 2015-04-18 DIAGNOSIS — N400 Enlarged prostate without lower urinary tract symptoms: Secondary | ICD-10-CM | POA: Diagnosis not present

## 2015-04-18 DIAGNOSIS — E789 Disorder of lipoprotein metabolism, unspecified: Secondary | ICD-10-CM | POA: Diagnosis not present

## 2015-04-18 DIAGNOSIS — E118 Type 2 diabetes mellitus with unspecified complications: Secondary | ICD-10-CM | POA: Diagnosis not present

## 2015-05-05 DIAGNOSIS — E119 Type 2 diabetes mellitus without complications: Secondary | ICD-10-CM | POA: Diagnosis not present

## 2015-05-05 DIAGNOSIS — H2513 Age-related nuclear cataract, bilateral: Secondary | ICD-10-CM | POA: Diagnosis not present

## 2015-05-05 DIAGNOSIS — H25013 Cortical age-related cataract, bilateral: Secondary | ICD-10-CM | POA: Diagnosis not present

## 2015-05-05 DIAGNOSIS — H524 Presbyopia: Secondary | ICD-10-CM | POA: Diagnosis not present

## 2015-05-17 DIAGNOSIS — E118 Type 2 diabetes mellitus with unspecified complications: Secondary | ICD-10-CM | POA: Diagnosis not present

## 2015-05-17 DIAGNOSIS — E789 Disorder of lipoprotein metabolism, unspecified: Secondary | ICD-10-CM | POA: Diagnosis not present

## 2015-05-17 DIAGNOSIS — I10 Essential (primary) hypertension: Secondary | ICD-10-CM | POA: Diagnosis not present

## 2015-05-23 DIAGNOSIS — E118 Type 2 diabetes mellitus with unspecified complications: Secondary | ICD-10-CM | POA: Diagnosis not present

## 2015-07-19 DIAGNOSIS — I10 Essential (primary) hypertension: Secondary | ICD-10-CM | POA: Diagnosis not present

## 2015-07-19 DIAGNOSIS — E118 Type 2 diabetes mellitus with unspecified complications: Secondary | ICD-10-CM | POA: Diagnosis not present

## 2015-07-25 DIAGNOSIS — I10 Essential (primary) hypertension: Secondary | ICD-10-CM | POA: Diagnosis not present

## 2015-07-25 DIAGNOSIS — E118 Type 2 diabetes mellitus with unspecified complications: Secondary | ICD-10-CM | POA: Diagnosis not present

## 2015-07-25 DIAGNOSIS — E789 Disorder of lipoprotein metabolism, unspecified: Secondary | ICD-10-CM | POA: Diagnosis not present

## 2016-01-09 DIAGNOSIS — I10 Essential (primary) hypertension: Secondary | ICD-10-CM | POA: Diagnosis not present

## 2016-01-09 DIAGNOSIS — E118 Type 2 diabetes mellitus with unspecified complications: Secondary | ICD-10-CM | POA: Diagnosis not present

## 2016-01-09 DIAGNOSIS — E789 Disorder of lipoprotein metabolism, unspecified: Secondary | ICD-10-CM | POA: Diagnosis not present

## 2016-01-18 DIAGNOSIS — S6992XA Unspecified injury of left wrist, hand and finger(s), initial encounter: Secondary | ICD-10-CM | POA: Diagnosis not present

## 2016-01-18 DIAGNOSIS — E789 Disorder of lipoprotein metabolism, unspecified: Secondary | ICD-10-CM | POA: Diagnosis not present

## 2016-01-18 DIAGNOSIS — I10 Essential (primary) hypertension: Secondary | ICD-10-CM | POA: Diagnosis not present

## 2016-01-18 DIAGNOSIS — M7989 Other specified soft tissue disorders: Secondary | ICD-10-CM | POA: Diagnosis not present

## 2016-01-18 DIAGNOSIS — M79645 Pain in left finger(s): Secondary | ICD-10-CM | POA: Diagnosis not present

## 2016-01-18 DIAGNOSIS — M79643 Pain in unspecified hand: Secondary | ICD-10-CM | POA: Diagnosis not present

## 2016-01-20 DIAGNOSIS — S63602A Unspecified sprain of left thumb, initial encounter: Secondary | ICD-10-CM | POA: Diagnosis not present

## 2016-03-05 DIAGNOSIS — R05 Cough: Secondary | ICD-10-CM | POA: Diagnosis not present

## 2016-04-11 DIAGNOSIS — Z79899 Other long term (current) drug therapy: Secondary | ICD-10-CM | POA: Diagnosis not present

## 2016-04-11 DIAGNOSIS — Z125 Encounter for screening for malignant neoplasm of prostate: Secondary | ICD-10-CM | POA: Diagnosis not present

## 2016-04-11 DIAGNOSIS — I10 Essential (primary) hypertension: Secondary | ICD-10-CM | POA: Diagnosis not present

## 2016-04-11 DIAGNOSIS — I119 Hypertensive heart disease without heart failure: Secondary | ICD-10-CM | POA: Diagnosis not present

## 2016-04-11 DIAGNOSIS — E118 Type 2 diabetes mellitus with unspecified complications: Secondary | ICD-10-CM | POA: Diagnosis not present

## 2016-04-18 DIAGNOSIS — I10 Essential (primary) hypertension: Secondary | ICD-10-CM | POA: Diagnosis not present

## 2016-04-18 DIAGNOSIS — E789 Disorder of lipoprotein metabolism, unspecified: Secondary | ICD-10-CM | POA: Diagnosis not present

## 2016-04-18 DIAGNOSIS — E118 Type 2 diabetes mellitus with unspecified complications: Secondary | ICD-10-CM | POA: Diagnosis not present

## 2016-06-25 DIAGNOSIS — Z79899 Other long term (current) drug therapy: Secondary | ICD-10-CM | POA: Diagnosis not present

## 2016-06-25 DIAGNOSIS — Z125 Encounter for screening for malignant neoplasm of prostate: Secondary | ICD-10-CM | POA: Diagnosis not present

## 2016-07-02 DIAGNOSIS — R899 Unspecified abnormal finding in specimens from other organs, systems and tissues: Secondary | ICD-10-CM | POA: Diagnosis not present

## 2016-07-02 DIAGNOSIS — E118 Type 2 diabetes mellitus with unspecified complications: Secondary | ICD-10-CM | POA: Diagnosis not present

## 2016-07-02 DIAGNOSIS — E789 Disorder of lipoprotein metabolism, unspecified: Secondary | ICD-10-CM | POA: Diagnosis not present

## 2016-08-05 DIAGNOSIS — I119 Hypertensive heart disease without heart failure: Secondary | ICD-10-CM | POA: Diagnosis not present

## 2016-08-05 DIAGNOSIS — G4733 Obstructive sleep apnea (adult) (pediatric): Secondary | ICD-10-CM | POA: Diagnosis not present

## 2016-08-27 DIAGNOSIS — H2513 Age-related nuclear cataract, bilateral: Secondary | ICD-10-CM | POA: Diagnosis not present

## 2016-08-27 DIAGNOSIS — E119 Type 2 diabetes mellitus without complications: Secondary | ICD-10-CM | POA: Diagnosis not present

## 2016-08-27 DIAGNOSIS — H25013 Cortical age-related cataract, bilateral: Secondary | ICD-10-CM | POA: Diagnosis not present

## 2016-08-27 DIAGNOSIS — H5203 Hypermetropia, bilateral: Secondary | ICD-10-CM | POA: Diagnosis not present

## 2016-10-23 DIAGNOSIS — Z85828 Personal history of other malignant neoplasm of skin: Secondary | ICD-10-CM | POA: Diagnosis not present

## 2016-10-23 DIAGNOSIS — L570 Actinic keratosis: Secondary | ICD-10-CM | POA: Diagnosis not present

## 2016-10-23 DIAGNOSIS — L820 Inflamed seborrheic keratosis: Secondary | ICD-10-CM | POA: Diagnosis not present

## 2016-10-23 DIAGNOSIS — L821 Other seborrheic keratosis: Secondary | ICD-10-CM | POA: Diagnosis not present

## 2016-12-25 DIAGNOSIS — E118 Type 2 diabetes mellitus with unspecified complications: Secondary | ICD-10-CM | POA: Diagnosis not present

## 2016-12-25 DIAGNOSIS — E789 Disorder of lipoprotein metabolism, unspecified: Secondary | ICD-10-CM | POA: Diagnosis not present

## 2016-12-25 DIAGNOSIS — I10 Essential (primary) hypertension: Secondary | ICD-10-CM | POA: Diagnosis not present

## 2017-01-02 ENCOUNTER — Encounter: Payer: Self-pay | Admitting: Internal Medicine

## 2017-01-02 DIAGNOSIS — E119 Type 2 diabetes mellitus without complications: Secondary | ICD-10-CM | POA: Diagnosis not present

## 2017-01-02 DIAGNOSIS — I10 Essential (primary) hypertension: Secondary | ICD-10-CM | POA: Diagnosis not present

## 2017-01-02 DIAGNOSIS — E7800 Pure hypercholesterolemia, unspecified: Secondary | ICD-10-CM | POA: Diagnosis not present

## 2017-01-02 DIAGNOSIS — Z0000 Encounter for general adult medical examination without abnormal findings: Secondary | ICD-10-CM | POA: Diagnosis not present

## 2017-02-04 HISTORY — PX: CATARACT EXTRACTION: SUR2

## 2017-03-19 DIAGNOSIS — R05 Cough: Secondary | ICD-10-CM | POA: Diagnosis not present

## 2017-03-19 DIAGNOSIS — J069 Acute upper respiratory infection, unspecified: Secondary | ICD-10-CM | POA: Diagnosis not present

## 2017-03-19 DIAGNOSIS — J3489 Other specified disorders of nose and nasal sinuses: Secondary | ICD-10-CM | POA: Diagnosis not present

## 2017-03-25 DIAGNOSIS — R69 Illness, unspecified: Secondary | ICD-10-CM | POA: Diagnosis not present

## 2017-04-10 DIAGNOSIS — R69 Illness, unspecified: Secondary | ICD-10-CM | POA: Diagnosis not present

## 2017-04-10 DIAGNOSIS — G4733 Obstructive sleep apnea (adult) (pediatric): Secondary | ICD-10-CM | POA: Diagnosis not present

## 2017-04-10 DIAGNOSIS — Z9989 Dependence on other enabling machines and devices: Secondary | ICD-10-CM | POA: Diagnosis not present

## 2017-04-10 DIAGNOSIS — E118 Type 2 diabetes mellitus with unspecified complications: Secondary | ICD-10-CM | POA: Diagnosis not present

## 2017-04-10 DIAGNOSIS — I10 Essential (primary) hypertension: Secondary | ICD-10-CM | POA: Diagnosis not present

## 2017-05-13 DIAGNOSIS — J019 Acute sinusitis, unspecified: Secondary | ICD-10-CM | POA: Diagnosis not present

## 2017-07-18 DIAGNOSIS — Z809 Family history of malignant neoplasm, unspecified: Secondary | ICD-10-CM | POA: Diagnosis not present

## 2017-07-18 DIAGNOSIS — E669 Obesity, unspecified: Secondary | ICD-10-CM | POA: Diagnosis not present

## 2017-07-18 DIAGNOSIS — Z8249 Family history of ischemic heart disease and other diseases of the circulatory system: Secondary | ICD-10-CM | POA: Diagnosis not present

## 2017-07-18 DIAGNOSIS — I10 Essential (primary) hypertension: Secondary | ICD-10-CM | POA: Diagnosis not present

## 2017-07-18 DIAGNOSIS — Z6832 Body mass index (BMI) 32.0-32.9, adult: Secondary | ICD-10-CM | POA: Diagnosis not present

## 2017-07-18 DIAGNOSIS — E785 Hyperlipidemia, unspecified: Secondary | ICD-10-CM | POA: Diagnosis not present

## 2017-07-18 DIAGNOSIS — Z833 Family history of diabetes mellitus: Secondary | ICD-10-CM | POA: Diagnosis not present

## 2017-07-18 DIAGNOSIS — N400 Enlarged prostate without lower urinary tract symptoms: Secondary | ICD-10-CM | POA: Diagnosis not present

## 2017-07-18 DIAGNOSIS — Z7984 Long term (current) use of oral hypoglycemic drugs: Secondary | ICD-10-CM | POA: Diagnosis not present

## 2017-07-18 DIAGNOSIS — E119 Type 2 diabetes mellitus without complications: Secondary | ICD-10-CM | POA: Diagnosis not present

## 2017-07-22 DIAGNOSIS — E119 Type 2 diabetes mellitus without complications: Secondary | ICD-10-CM | POA: Diagnosis not present

## 2017-07-22 DIAGNOSIS — R69 Illness, unspecified: Secondary | ICD-10-CM | POA: Diagnosis not present

## 2017-07-22 DIAGNOSIS — Z0000 Encounter for general adult medical examination without abnormal findings: Secondary | ICD-10-CM | POA: Diagnosis not present

## 2017-07-22 DIAGNOSIS — Z125 Encounter for screening for malignant neoplasm of prostate: Secondary | ICD-10-CM | POA: Diagnosis not present

## 2017-07-22 DIAGNOSIS — I10 Essential (primary) hypertension: Secondary | ICD-10-CM | POA: Diagnosis not present

## 2017-07-28 DIAGNOSIS — E118 Type 2 diabetes mellitus with unspecified complications: Secondary | ICD-10-CM | POA: Diagnosis not present

## 2017-07-28 DIAGNOSIS — Z23 Encounter for immunization: Secondary | ICD-10-CM | POA: Diagnosis not present

## 2017-07-28 DIAGNOSIS — G250 Essential tremor: Secondary | ICD-10-CM | POA: Diagnosis not present

## 2017-07-28 DIAGNOSIS — I10 Essential (primary) hypertension: Secondary | ICD-10-CM | POA: Diagnosis not present

## 2017-07-28 DIAGNOSIS — Z0000 Encounter for general adult medical examination without abnormal findings: Secondary | ICD-10-CM | POA: Diagnosis not present

## 2017-07-28 DIAGNOSIS — R972 Elevated prostate specific antigen [PSA]: Secondary | ICD-10-CM | POA: Diagnosis not present

## 2017-08-11 DIAGNOSIS — E785 Hyperlipidemia, unspecified: Secondary | ICD-10-CM | POA: Diagnosis not present

## 2017-08-11 DIAGNOSIS — E668 Other obesity: Secondary | ICD-10-CM | POA: Diagnosis not present

## 2017-08-11 DIAGNOSIS — I119 Hypertensive heart disease without heart failure: Secondary | ICD-10-CM | POA: Diagnosis not present

## 2017-08-11 DIAGNOSIS — E119 Type 2 diabetes mellitus without complications: Secondary | ICD-10-CM | POA: Diagnosis not present

## 2017-08-11 DIAGNOSIS — G4733 Obstructive sleep apnea (adult) (pediatric): Secondary | ICD-10-CM | POA: Diagnosis not present

## 2017-08-11 DIAGNOSIS — I493 Ventricular premature depolarization: Secondary | ICD-10-CM | POA: Diagnosis not present

## 2017-09-02 DIAGNOSIS — H2513 Age-related nuclear cataract, bilateral: Secondary | ICD-10-CM | POA: Diagnosis not present

## 2017-09-02 DIAGNOSIS — E119 Type 2 diabetes mellitus without complications: Secondary | ICD-10-CM | POA: Diagnosis not present

## 2017-09-02 DIAGNOSIS — H43813 Vitreous degeneration, bilateral: Secondary | ICD-10-CM | POA: Diagnosis not present

## 2017-09-02 DIAGNOSIS — H524 Presbyopia: Secondary | ICD-10-CM | POA: Diagnosis not present

## 2017-11-20 DIAGNOSIS — I10 Essential (primary) hypertension: Secondary | ICD-10-CM | POA: Diagnosis not present

## 2017-11-20 DIAGNOSIS — E118 Type 2 diabetes mellitus with unspecified complications: Secondary | ICD-10-CM | POA: Diagnosis not present

## 2017-12-02 DIAGNOSIS — R0683 Snoring: Secondary | ICD-10-CM | POA: Diagnosis not present

## 2017-12-02 DIAGNOSIS — I10 Essential (primary) hypertension: Secondary | ICD-10-CM | POA: Diagnosis not present

## 2017-12-02 DIAGNOSIS — Z23 Encounter for immunization: Secondary | ICD-10-CM | POA: Diagnosis not present

## 2017-12-02 DIAGNOSIS — Z9989 Dependence on other enabling machines and devices: Secondary | ICD-10-CM | POA: Diagnosis not present

## 2017-12-02 DIAGNOSIS — G250 Essential tremor: Secondary | ICD-10-CM | POA: Diagnosis not present

## 2017-12-02 DIAGNOSIS — G4733 Obstructive sleep apnea (adult) (pediatric): Secondary | ICD-10-CM | POA: Diagnosis not present

## 2017-12-02 DIAGNOSIS — R972 Elevated prostate specific antigen [PSA]: Secondary | ICD-10-CM | POA: Diagnosis not present

## 2017-12-02 DIAGNOSIS — E118 Type 2 diabetes mellitus with unspecified complications: Secondary | ICD-10-CM | POA: Diagnosis not present

## 2017-12-02 DIAGNOSIS — Z125 Encounter for screening for malignant neoplasm of prostate: Secondary | ICD-10-CM | POA: Diagnosis not present

## 2017-12-02 DIAGNOSIS — G4709 Other insomnia: Secondary | ICD-10-CM | POA: Diagnosis not present

## 2017-12-03 DIAGNOSIS — D225 Melanocytic nevi of trunk: Secondary | ICD-10-CM | POA: Diagnosis not present

## 2017-12-03 DIAGNOSIS — D2371 Other benign neoplasm of skin of right lower limb, including hip: Secondary | ICD-10-CM | POA: Diagnosis not present

## 2017-12-03 DIAGNOSIS — D1801 Hemangioma of skin and subcutaneous tissue: Secondary | ICD-10-CM | POA: Diagnosis not present

## 2017-12-03 DIAGNOSIS — D485 Neoplasm of uncertain behavior of skin: Secondary | ICD-10-CM | POA: Diagnosis not present

## 2017-12-03 DIAGNOSIS — L570 Actinic keratosis: Secondary | ICD-10-CM | POA: Diagnosis not present

## 2017-12-03 DIAGNOSIS — Z85828 Personal history of other malignant neoplasm of skin: Secondary | ICD-10-CM | POA: Diagnosis not present

## 2017-12-03 DIAGNOSIS — L814 Other melanin hyperpigmentation: Secondary | ICD-10-CM | POA: Diagnosis not present

## 2017-12-03 DIAGNOSIS — L820 Inflamed seborrheic keratosis: Secondary | ICD-10-CM | POA: Diagnosis not present

## 2017-12-03 DIAGNOSIS — L821 Other seborrheic keratosis: Secondary | ICD-10-CM | POA: Diagnosis not present

## 2018-01-22 DIAGNOSIS — L570 Actinic keratosis: Secondary | ICD-10-CM | POA: Diagnosis not present

## 2018-01-22 DIAGNOSIS — Z85828 Personal history of other malignant neoplasm of skin: Secondary | ICD-10-CM | POA: Diagnosis not present

## 2018-02-03 DIAGNOSIS — J019 Acute sinusitis, unspecified: Secondary | ICD-10-CM | POA: Diagnosis not present

## 2018-02-16 DIAGNOSIS — G4733 Obstructive sleep apnea (adult) (pediatric): Secondary | ICD-10-CM | POA: Diagnosis not present

## 2018-03-31 DIAGNOSIS — G4733 Obstructive sleep apnea (adult) (pediatric): Secondary | ICD-10-CM | POA: Diagnosis not present

## 2018-03-31 DIAGNOSIS — I10 Essential (primary) hypertension: Secondary | ICD-10-CM | POA: Diagnosis not present

## 2018-04-03 DIAGNOSIS — I10 Essential (primary) hypertension: Secondary | ICD-10-CM | POA: Diagnosis not present

## 2018-04-03 DIAGNOSIS — G4733 Obstructive sleep apnea (adult) (pediatric): Secondary | ICD-10-CM | POA: Diagnosis not present

## 2018-04-22 DIAGNOSIS — E118 Type 2 diabetes mellitus with unspecified complications: Secondary | ICD-10-CM | POA: Diagnosis not present

## 2018-04-22 DIAGNOSIS — I10 Essential (primary) hypertension: Secondary | ICD-10-CM | POA: Diagnosis not present

## 2018-04-27 DIAGNOSIS — I10 Essential (primary) hypertension: Secondary | ICD-10-CM | POA: Diagnosis not present

## 2018-04-27 DIAGNOSIS — E118 Type 2 diabetes mellitus with unspecified complications: Secondary | ICD-10-CM | POA: Diagnosis not present

## 2018-04-27 DIAGNOSIS — R351 Nocturia: Secondary | ICD-10-CM | POA: Diagnosis not present

## 2018-04-27 DIAGNOSIS — N401 Enlarged prostate with lower urinary tract symptoms: Secondary | ICD-10-CM | POA: Diagnosis not present

## 2018-04-27 DIAGNOSIS — R972 Elevated prostate specific antigen [PSA]: Secondary | ICD-10-CM | POA: Diagnosis not present

## 2018-09-07 ENCOUNTER — Other Ambulatory Visit: Payer: Self-pay

## 2018-09-07 DIAGNOSIS — Z20822 Contact with and (suspected) exposure to covid-19: Secondary | ICD-10-CM

## 2018-09-07 DIAGNOSIS — R6889 Other general symptoms and signs: Secondary | ICD-10-CM | POA: Diagnosis not present

## 2018-09-08 LAB — NOVEL CORONAVIRUS, NAA: SARS-CoV-2, NAA: NOT DETECTED

## 2018-09-17 DIAGNOSIS — R69 Illness, unspecified: Secondary | ICD-10-CM | POA: Diagnosis not present

## 2018-09-22 DIAGNOSIS — R69 Illness, unspecified: Secondary | ICD-10-CM | POA: Diagnosis not present

## 2018-10-06 DIAGNOSIS — N401 Enlarged prostate with lower urinary tract symptoms: Secondary | ICD-10-CM | POA: Diagnosis not present

## 2018-10-06 DIAGNOSIS — R350 Frequency of micturition: Secondary | ICD-10-CM | POA: Diagnosis not present

## 2018-10-09 DIAGNOSIS — Z23 Encounter for immunization: Secondary | ICD-10-CM | POA: Diagnosis not present

## 2018-10-13 DIAGNOSIS — H524 Presbyopia: Secondary | ICD-10-CM | POA: Diagnosis not present

## 2018-10-13 DIAGNOSIS — H2513 Age-related nuclear cataract, bilateral: Secondary | ICD-10-CM | POA: Diagnosis not present

## 2018-10-13 DIAGNOSIS — E119 Type 2 diabetes mellitus without complications: Secondary | ICD-10-CM | POA: Diagnosis not present

## 2018-10-13 DIAGNOSIS — H25013 Cortical age-related cataract, bilateral: Secondary | ICD-10-CM | POA: Diagnosis not present

## 2018-10-15 DIAGNOSIS — N401 Enlarged prostate with lower urinary tract symptoms: Secondary | ICD-10-CM | POA: Insufficient documentation

## 2018-12-07 DIAGNOSIS — E118 Type 2 diabetes mellitus with unspecified complications: Secondary | ICD-10-CM | POA: Diagnosis not present

## 2018-12-07 DIAGNOSIS — I10 Essential (primary) hypertension: Secondary | ICD-10-CM | POA: Diagnosis not present

## 2018-12-07 DIAGNOSIS — R972 Elevated prostate specific antigen [PSA]: Secondary | ICD-10-CM | POA: Diagnosis not present

## 2018-12-09 DIAGNOSIS — E669 Obesity, unspecified: Secondary | ICD-10-CM | POA: Diagnosis not present

## 2018-12-09 DIAGNOSIS — Z880 Allergy status to penicillin: Secondary | ICD-10-CM | POA: Diagnosis not present

## 2018-12-09 DIAGNOSIS — Z6832 Body mass index (BMI) 32.0-32.9, adult: Secondary | ICD-10-CM | POA: Diagnosis not present

## 2018-12-09 DIAGNOSIS — N400 Enlarged prostate without lower urinary tract symptoms: Secondary | ICD-10-CM | POA: Diagnosis not present

## 2018-12-09 DIAGNOSIS — Z008 Encounter for other general examination: Secondary | ICD-10-CM | POA: Diagnosis not present

## 2018-12-09 DIAGNOSIS — Z85828 Personal history of other malignant neoplasm of skin: Secondary | ICD-10-CM | POA: Diagnosis not present

## 2018-12-09 DIAGNOSIS — Z7984 Long term (current) use of oral hypoglycemic drugs: Secondary | ICD-10-CM | POA: Diagnosis not present

## 2018-12-09 DIAGNOSIS — N529 Male erectile dysfunction, unspecified: Secondary | ICD-10-CM | POA: Diagnosis not present

## 2018-12-09 DIAGNOSIS — E119 Type 2 diabetes mellitus without complications: Secondary | ICD-10-CM | POA: Diagnosis not present

## 2018-12-09 DIAGNOSIS — I10 Essential (primary) hypertension: Secondary | ICD-10-CM | POA: Diagnosis not present

## 2018-12-09 DIAGNOSIS — E785 Hyperlipidemia, unspecified: Secondary | ICD-10-CM | POA: Diagnosis not present

## 2018-12-14 DIAGNOSIS — I10 Essential (primary) hypertension: Secondary | ICD-10-CM | POA: Diagnosis not present

## 2018-12-14 DIAGNOSIS — R972 Elevated prostate specific antigen [PSA]: Secondary | ICD-10-CM | POA: Diagnosis not present

## 2018-12-14 DIAGNOSIS — E7800 Pure hypercholesterolemia, unspecified: Secondary | ICD-10-CM | POA: Diagnosis not present

## 2018-12-14 DIAGNOSIS — G250 Essential tremor: Secondary | ICD-10-CM | POA: Diagnosis not present

## 2018-12-14 DIAGNOSIS — E119 Type 2 diabetes mellitus without complications: Secondary | ICD-10-CM | POA: Diagnosis not present

## 2018-12-14 DIAGNOSIS — Z0000 Encounter for general adult medical examination without abnormal findings: Secondary | ICD-10-CM | POA: Diagnosis not present

## 2019-01-07 DIAGNOSIS — R350 Frequency of micturition: Secondary | ICD-10-CM | POA: Diagnosis not present

## 2019-01-07 DIAGNOSIS — N401 Enlarged prostate with lower urinary tract symptoms: Secondary | ICD-10-CM | POA: Diagnosis not present

## 2019-01-20 DIAGNOSIS — L738 Other specified follicular disorders: Secondary | ICD-10-CM | POA: Diagnosis not present

## 2019-01-20 DIAGNOSIS — L570 Actinic keratosis: Secondary | ICD-10-CM | POA: Diagnosis not present

## 2019-01-20 DIAGNOSIS — M71341 Other bursal cyst, right hand: Secondary | ICD-10-CM | POA: Diagnosis not present

## 2019-01-20 DIAGNOSIS — M71342 Other bursal cyst, left hand: Secondary | ICD-10-CM | POA: Diagnosis not present

## 2019-01-20 DIAGNOSIS — L821 Other seborrheic keratosis: Secondary | ICD-10-CM | POA: Diagnosis not present

## 2019-01-20 DIAGNOSIS — D1801 Hemangioma of skin and subcutaneous tissue: Secondary | ICD-10-CM | POA: Diagnosis not present

## 2019-01-20 DIAGNOSIS — D225 Melanocytic nevi of trunk: Secondary | ICD-10-CM | POA: Diagnosis not present

## 2019-01-20 DIAGNOSIS — D485 Neoplasm of uncertain behavior of skin: Secondary | ICD-10-CM | POA: Diagnosis not present

## 2019-01-20 DIAGNOSIS — L814 Other melanin hyperpigmentation: Secondary | ICD-10-CM | POA: Diagnosis not present

## 2019-01-20 DIAGNOSIS — L308 Other specified dermatitis: Secondary | ICD-10-CM | POA: Diagnosis not present

## 2019-01-20 DIAGNOSIS — Z85828 Personal history of other malignant neoplasm of skin: Secondary | ICD-10-CM | POA: Diagnosis not present

## 2019-01-21 DIAGNOSIS — H2511 Age-related nuclear cataract, right eye: Secondary | ICD-10-CM | POA: Diagnosis not present

## 2019-01-21 DIAGNOSIS — H25011 Cortical age-related cataract, right eye: Secondary | ICD-10-CM | POA: Diagnosis not present

## 2019-01-21 DIAGNOSIS — H25811 Combined forms of age-related cataract, right eye: Secondary | ICD-10-CM | POA: Diagnosis not present

## 2019-01-26 DIAGNOSIS — R69 Illness, unspecified: Secondary | ICD-10-CM | POA: Diagnosis not present

## 2019-02-11 DIAGNOSIS — H2512 Age-related nuclear cataract, left eye: Secondary | ICD-10-CM | POA: Diagnosis not present

## 2019-02-11 DIAGNOSIS — H25012 Cortical age-related cataract, left eye: Secondary | ICD-10-CM | POA: Diagnosis not present

## 2019-02-11 DIAGNOSIS — H25812 Combined forms of age-related cataract, left eye: Secondary | ICD-10-CM | POA: Diagnosis not present

## 2019-05-13 DIAGNOSIS — Z0100 Encounter for examination of eyes and vision without abnormal findings: Secondary | ICD-10-CM | POA: Diagnosis not present

## 2019-06-09 DIAGNOSIS — E119 Type 2 diabetes mellitus without complications: Secondary | ICD-10-CM | POA: Diagnosis not present

## 2019-06-09 DIAGNOSIS — N400 Enlarged prostate without lower urinary tract symptoms: Secondary | ICD-10-CM | POA: Diagnosis not present

## 2019-06-09 DIAGNOSIS — Z8051 Family history of malignant neoplasm of kidney: Secondary | ICD-10-CM | POA: Diagnosis not present

## 2019-06-09 DIAGNOSIS — E669 Obesity, unspecified: Secondary | ICD-10-CM | POA: Diagnosis not present

## 2019-06-09 DIAGNOSIS — E785 Hyperlipidemia, unspecified: Secondary | ICD-10-CM | POA: Diagnosis not present

## 2019-06-09 DIAGNOSIS — Z833 Family history of diabetes mellitus: Secondary | ICD-10-CM | POA: Diagnosis not present

## 2019-06-09 DIAGNOSIS — I499 Cardiac arrhythmia, unspecified: Secondary | ICD-10-CM | POA: Diagnosis not present

## 2019-06-09 DIAGNOSIS — Z8249 Family history of ischemic heart disease and other diseases of the circulatory system: Secondary | ICD-10-CM | POA: Diagnosis not present

## 2019-06-09 DIAGNOSIS — Z7984 Long term (current) use of oral hypoglycemic drugs: Secondary | ICD-10-CM | POA: Diagnosis not present

## 2019-06-09 DIAGNOSIS — I10 Essential (primary) hypertension: Secondary | ICD-10-CM | POA: Diagnosis not present

## 2019-06-11 DIAGNOSIS — E119 Type 2 diabetes mellitus without complications: Secondary | ICD-10-CM | POA: Diagnosis not present

## 2019-06-11 DIAGNOSIS — I10 Essential (primary) hypertension: Secondary | ICD-10-CM | POA: Diagnosis not present

## 2019-06-11 DIAGNOSIS — G4733 Obstructive sleep apnea (adult) (pediatric): Secondary | ICD-10-CM | POA: Diagnosis not present

## 2019-06-11 DIAGNOSIS — R972 Elevated prostate specific antigen [PSA]: Secondary | ICD-10-CM | POA: Diagnosis not present

## 2019-06-11 DIAGNOSIS — G250 Essential tremor: Secondary | ICD-10-CM | POA: Diagnosis not present

## 2019-06-11 DIAGNOSIS — E118 Type 2 diabetes mellitus with unspecified complications: Secondary | ICD-10-CM | POA: Diagnosis not present

## 2019-06-11 DIAGNOSIS — N930 Postcoital and contact bleeding: Secondary | ICD-10-CM | POA: Diagnosis not present

## 2019-06-14 DIAGNOSIS — R69 Illness, unspecified: Secondary | ICD-10-CM | POA: Diagnosis not present

## 2019-06-21 DIAGNOSIS — E789 Disorder of lipoprotein metabolism, unspecified: Secondary | ICD-10-CM | POA: Diagnosis not present

## 2019-06-21 DIAGNOSIS — I10 Essential (primary) hypertension: Secondary | ICD-10-CM | POA: Diagnosis not present

## 2019-06-21 DIAGNOSIS — E118 Type 2 diabetes mellitus with unspecified complications: Secondary | ICD-10-CM | POA: Diagnosis not present

## 2019-06-21 LAB — CBC AND DIFFERENTIAL
HCT: 43 (ref 41–53)
Hemoglobin: 14.1 (ref 13.5–17.5)
Platelets: 203 (ref 150–399)
WBC: 8.4

## 2019-06-21 LAB — PSA: PSA: 2.39

## 2019-06-21 LAB — LIPID PANEL
Cholesterol: 134 (ref 0–200)
HDL: 51 (ref 35–70)
LDL Cholesterol: 70
Triglycerides: 64 (ref 40–160)

## 2019-06-21 LAB — BASIC METABOLIC PANEL
BUN: 21 (ref 4–21)
CO2: 27 — AB (ref 13–22)
Chloride: 104 (ref 99–108)
Creatinine: 1 (ref 0.6–1.3)
Glucose: 116
Potassium: 5.1 (ref 3.4–5.3)
Sodium: 137 (ref 137–147)

## 2019-06-21 LAB — CBC: RBC: 4.47 (ref 3.87–5.11)

## 2019-06-21 LAB — HEPATIC FUNCTION PANEL
ALT: 30 (ref 10–40)
AST: 14 (ref 14–40)
Bilirubin, Total: 0.6

## 2019-06-21 LAB — COMPREHENSIVE METABOLIC PANEL
Calcium: 9.9 (ref 8.7–10.7)
GFR calc non Af Amer: 76
Globulin: 2.6

## 2019-06-21 LAB — HEMOGLOBIN A1C: Hemoglobin A1C: 6.7

## 2019-07-26 DIAGNOSIS — R69 Illness, unspecified: Secondary | ICD-10-CM | POA: Diagnosis not present

## 2019-08-18 ENCOUNTER — Encounter (HOSPITAL_COMMUNITY): Payer: Self-pay | Admitting: Emergency Medicine

## 2019-08-18 ENCOUNTER — Emergency Department (HOSPITAL_COMMUNITY)
Admission: EM | Admit: 2019-08-18 | Discharge: 2019-08-19 | Disposition: A | Discharge status: Home / Self Care | Payer: Medicare HMO | Attending: Emergency Medicine | Admitting: Emergency Medicine

## 2019-08-18 ENCOUNTER — Other Ambulatory Visit: Payer: Self-pay

## 2019-08-18 DIAGNOSIS — U071 COVID-19: Secondary | ICD-10-CM | POA: Diagnosis not present

## 2019-08-18 DIAGNOSIS — R519 Headache, unspecified: Secondary | ICD-10-CM | POA: Diagnosis not present

## 2019-08-18 DIAGNOSIS — R05 Cough: Secondary | ICD-10-CM | POA: Diagnosis not present

## 2019-08-18 DIAGNOSIS — R509 Fever, unspecified: Secondary | ICD-10-CM | POA: Diagnosis present

## 2019-08-18 DIAGNOSIS — Z5321 Procedure and treatment not carried out due to patient leaving prior to being seen by health care provider: Secondary | ICD-10-CM | POA: Insufficient documentation

## 2019-08-18 DIAGNOSIS — Z20822 Contact with and (suspected) exposure to covid-19: Secondary | ICD-10-CM | POA: Diagnosis not present

## 2019-08-18 NOTE — ED Triage Notes (Signed)
Pt reports having headache and congestion for 2 days- he tested positive for covid today and came to the ER due to fever cough and headache. Pt states he just came early because he was told he would need antibiotics and steroids. Pt has no sob or cp.

## 2019-08-19 NOTE — ED Notes (Signed)
Pt  No longer in lobby, did not respond for vitals check and not seen outside lobby area

## 2019-08-20 ENCOUNTER — Telehealth: Payer: Self-pay | Admitting: Nurse Practitioner

## 2019-08-20 ENCOUNTER — Other Ambulatory Visit: Payer: Self-pay | Admitting: Nurse Practitioner

## 2019-08-20 DIAGNOSIS — E119 Type 2 diabetes mellitus without complications: Secondary | ICD-10-CM

## 2019-08-20 DIAGNOSIS — I10 Essential (primary) hypertension: Secondary | ICD-10-CM

## 2019-08-20 DIAGNOSIS — U071 COVID-19: Secondary | ICD-10-CM

## 2019-08-20 MED ORDER — SODIUM CHLORIDE 0.9 % IV SOLN
Freq: Once | INTRAVENOUS | Status: AC
  Filled 2019-08-20: qty 600

## 2019-08-20 NOTE — Telephone Encounter (Signed)
Called to discuss with Danny Hunter about Covid symptoms and the use of  casirivimab/imdevimab, a combination monoclonal antibody infusion for those with mild to moderate Covid symptoms and at a high risk of hospitalization.     Pt is qualified for this infusion at the [facility] infusion center due to co-morbid conditions (>65 years, hypertension, diabetes). Symptoms started on 7//12/21 with cough, congestion, fever, and body aches. Patient was fully vaccinated with Moderna (02/16/19 and 03/18/19). Recently traveled by flight to [place].   Patient verbalized understanding of infusion and requested appointment. He has been scheduled for 08/21/19 at 1230pm. He verbalized understanding of instructions.      Patient Active Problem List   Diagnosis Date Noted  . 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

## 2019-08-20 NOTE — Progress Notes (Signed)
I connected by phone with Danny Hunter on 08/20/2019 at 10:46 AM to discuss the potential use of a new treatment for mild to moderate COVID-19 viral infection in non-hospitalized patients.  This patient is a 79 y.o. male that meets the FDA criteria for Emergency Use Authorization of COVID monoclonal antibody casirivimab/imdevimab.  Has a (+) direct SARS-CoV-2 viral test result  Has mild or moderate COVID-19   Is NOT hospitalized due to COVID-19  Is within 10 days of symptom onset  Has at least one of the high risk factor(s) for progression to severe COVID-19 and/or hospitalization as defined in EUA.  Specific high risk criteria : Older age (>/= 80 yo), diabetes, and hypertension.    I have spoken and communicated the following to the patient or parent/caregiver regarding COVID monoclonal antibody treatment:  1. FDA has authorized the emergency use for the treatment of mild to moderate COVID-19 in adults and pediatric patients with positive results of direct SARS-CoV-2 viral testing who are 9 years of age and older weighing at least 40 kg, and who are at high risk for progressing to severe COVID-19 and/or hospitalization.  2. The significant known and potential risks and benefits of COVID monoclonal antibody, and the extent to which such potential risks and benefits are unknown.  3. Information on available alternative treatments and the risks and benefits of those alternatives, including clinical trials.  4. Patients treated with COVID monoclonal antibody should continue to self-isolate and use infection control measures (e.g., wear mask, isolate, social distance, avoid sharing personal items, clean and disinfect "high touch" surfaces, and frequent handwashing) according to CDC guidelines.   5. The patient or parent/caregiver has the option to accept or refuse COVID monoclonal antibody treatment.  After reviewing this information with the patient, The patient agreed to proceed with  receiving casirivimab\imdevimab infusion and will be provided a copy of the Fact sheet prior to receiving the infusion. Danny Hunter 08/20/2019 10:46 AM

## 2019-08-21 ENCOUNTER — Ambulatory Visit (HOSPITAL_COMMUNITY)
Admission: RE | Admit: 2019-08-21 | Discharge: 2019-08-21 | Disposition: A | Discharge status: Home / Self Care | Payer: Medicare Other | Source: Ambulatory Visit | Attending: Pulmonary Disease | Admitting: Pulmonary Disease

## 2019-08-21 DIAGNOSIS — E119 Type 2 diabetes mellitus without complications: Secondary | ICD-10-CM | POA: Diagnosis present

## 2019-08-21 DIAGNOSIS — U071 COVID-19: Secondary | ICD-10-CM | POA: Insufficient documentation

## 2019-08-21 DIAGNOSIS — Z23 Encounter for immunization: Secondary | ICD-10-CM | POA: Insufficient documentation

## 2019-08-21 DIAGNOSIS — I10 Essential (primary) hypertension: Secondary | ICD-10-CM | POA: Diagnosis present

## 2019-08-21 MED ORDER — SODIUM CHLORIDE 0.9 % IV SOLN: INTRAVENOUS | Status: DC | PRN

## 2019-08-21 MED ORDER — FAMOTIDINE IN NACL 20-0.9 MG/50ML-% IV SOLN: 20.0000 mg | Freq: Once | INTRAVENOUS | Status: DC | PRN

## 2019-08-21 MED ORDER — METHYLPREDNISOLONE SODIUM SUCC 125 MG IJ SOLR: 125.0000 mg | Freq: Once | INTRAMUSCULAR | Status: DC | PRN

## 2019-08-21 MED ORDER — EPINEPHRINE 0.3 MG/0.3ML IJ SOAJ: 0.3000 mg | Freq: Once | INTRAMUSCULAR | Status: DC | PRN

## 2019-08-21 MED ORDER — ALBUTEROL SULFATE HFA 108 (90 BASE) MCG/ACT IN AERS: 2.0000 | INHALATION_SPRAY | Freq: Once | RESPIRATORY_TRACT | Status: DC | PRN

## 2019-08-21 MED ORDER — DIPHENHYDRAMINE HCL 50 MG/ML IJ SOLN: 50.0000 mg | Freq: Once | INTRAMUSCULAR | Status: DC | PRN

## 2019-08-21 NOTE — Progress Notes (Signed)
  Diagnosis: COVID-19  Physician: Dr. Joya Gaskins   Procedure: Covid Infusion Clinic Med: casirivimab\imdevimab infusion - Provided patient with casirivimab\imdevimab fact sheet for patients, parents and caregivers prior to infusion.  Complications: No immediate complications noted.  Discharge: Discharged home   [place] 08/21/2019

## 2019-08-21 NOTE — Discharge Instructions (Signed)

## 2019-10-19 ENCOUNTER — Ambulatory Visit (INDEPENDENT_AMBULATORY_CARE_PROVIDER_SITE_OTHER): Payer: Medicare HMO | Admitting: Physician Assistant

## 2019-10-19 ENCOUNTER — Encounter: Payer: Self-pay | Admitting: Physician Assistant

## 2019-10-19 ENCOUNTER — Other Ambulatory Visit: Payer: Self-pay

## 2019-10-19 VITALS — BP 110/70 | HR 95 | Temp 98.2°F | Resp 16 | Ht 66.0 in | Wt 195.0 lb

## 2019-10-19 DIAGNOSIS — E785 Hyperlipidemia, unspecified: Secondary | ICD-10-CM | POA: Diagnosis not present

## 2019-10-19 DIAGNOSIS — E1159 Type 2 diabetes mellitus with other circulatory complications: Secondary | ICD-10-CM | POA: Diagnosis not present

## 2019-10-19 DIAGNOSIS — I152 Hypertension secondary to endocrine disorders: Secondary | ICD-10-CM

## 2019-10-19 DIAGNOSIS — N400 Enlarged prostate without lower urinary tract symptoms: Secondary | ICD-10-CM | POA: Diagnosis not present

## 2019-10-19 DIAGNOSIS — E1169 Type 2 diabetes mellitus with other specified complication: Secondary | ICD-10-CM

## 2019-10-19 DIAGNOSIS — E119 Type 2 diabetes mellitus without complications: Secondary | ICD-10-CM

## 2019-10-19 DIAGNOSIS — I517 Cardiomegaly: Secondary | ICD-10-CM | POA: Diagnosis not present

## 2019-10-19 DIAGNOSIS — I10 Essential (primary) hypertension: Secondary | ICD-10-CM

## 2019-10-19 NOTE — Progress Notes (Signed)
Patient presents to clinic today to establish care.  Acute Concerns: Endorses tremors noted over the past couple of years. Notes tremor of bilateral hands, slightly with rest but worsening with movement/intent. 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 Denies tremor of head or neck. Denies rigidity or gait disturbance.   Constipation over the past month. Notes having a BM daily but very hard to pass stools. Notes significant straining. Denies melena, hematochezia or tenesmus. Is staying well-hydrated. Denies any change to diet.  Last colonoscopy normal. Was told he would not need another.   Chronic Issues: Hypertension -- With prior diagnosis of LVH. Patient is currently on a regimen of losartan 100 mg daily, amlodipine 5 mg daily. Endorses taking medications daily as directed. Patient denies chest pain, palpitations, lightheadedness, dizziness, vision changes or frequent headaches. Was followed by Cardiology but his provider retired. Wants a referral to a new Cardiologist in the area.   BP Readings from Last 3 Encounters:  10/19/19 110/70  08/21/19 115/79  08/19/19 124/63   Hyperlipidemia -- On a regimen of Atorvastatin 40 mg daily. Is taking as directed. Endorses well-balanced diet overall. Exercises daily.   Diabetes Mellitus -- Controlled per patient. No history of neuropathy or nephropathy. Is currently on a regimen of Metformin 1000 mg BID. Is taking as directed. Is not checking glucose daily. Notes last A1C at 6.7 on 06/24/19 with previous PCP. Has report of labs with him today. Eye examination -- couple of months ago. Denies history diabetic retinopathy. Recently had cataract surgery.   BPH -- Previously followed by Urology but they retired. Is on a regimen of Alfuzosin 10 mg daily and finasteride 5 mg qd. His urologist moved and he would like a new specialist in there area.    Health Maintenance: Colonoscopy --  UTD  Past Medical History:  Diagnosis Date  . Anxiety   . Diabetes mellitus   . Enlarged heart   . Hyperlipemia   . Hypertension   . Sleep apnea    on c-pap  . Tubular adenoma of colon    2011    Past Surgical History:  Procedure Laterality Date  . CATARACT EXTRACTION  2019  . TOTAL SHOULDER ARTHROPLASTY  10/22/2011   Procedure: TOTAL SHOULDER ARTHROPLASTY;  Surgeon: Nita Sells, MD;  Location: [place];  Service: Orthopedics;  Laterality: Right;  right total shoulder arthroplasty    Current Outpatient Medications on File Prior to Visit  Medication Sig Dispense Refill  . alfuzosin (UROXATRAL) 10 MG 24 hr tablet Take 10 mg by mouth daily.    Marland Kitchen amLODipine (NORVASC) 5 MG tablet Take 5 mg by mouth daily.    Marland Kitchen atorvastatin (LIPITOR) 40 MG tablet Take 40 mg by mouth daily.    . finasteride (PROSCAR) 5 MG tablet Take 5 mg by mouth daily.    Marland Kitchen losartan (COZAAR) 100 MG tablet Take 100 mg by mouth daily.     . metFORMIN (GLUCOPHAGE) 500 MG tablet Take 1,000 mg by mouth 2 (two) times daily with a meal.     . spironolactone (ALDACTONE) 25 MG tablet Take 25 mg by mouth daily.     No current facility-administered medications on file prior to visit.    No Known Allergies  Family History  Problem Relation Age of Onset  . Cancer Mother   . Heart disease Father   . Cancer Son     Social History   Socioeconomic  History  . Marital status: Married    Spouse name: Not on file  . Number of children: Not on file  . Years of education: Not on file  . Highest education level: Not on file  Occupational History  . Occupation: Retired  Tobacco Use  . Smoking status: Never Smoker  . Smokeless tobacco: Never Used  Vaping Use  . Vaping Use: Never used  Substance and Sexual Activity  . Alcohol use: Yes    Alcohol/week: 0.0 standard drinks    Comment: 1 drink a month  . Drug use: No  . Sexual activity: Not Currently  Other Topics Concern  . Not on file  Social History  Narrative  . Not on file   Social Determinants of Health   Financial Resource Strain:   . Difficulty of Paying Living Expenses: Not on file  Food Insecurity:   . Worried About Charity fundraiser in the Last Year: Not on file  . Ran Out of Food in the Last Year: Not on file  Transportation Needs:   . Lack of Transportation (Medical): Not on file  . Lack of Transportation (Non-Medical): Not on file  Physical Activity:   . Days of Exercise per Week: Not on file  . Minutes of Exercise per Session: Not on file  Stress:   . Feeling of Stress : Not on file  Social Connections:   . Frequency of Communication with Friends and Family: Not on file  . Frequency of Social Gatherings with Friends and Family: Not on file  . Attends Religious Services: Not on file  . Active Member of Clubs or Organizations: Not on file  . Attends Archivist Meetings: Not on file  . Marital Status: Not on file  Intimate Partner Violence:   . Fear of Current or Ex-Partner: Not on file  . Emotionally Abused: Not on file  . Physically Abused: Not on file  . Sexually Abused: Not on file   ROS Pertinent ROS are listed in the HPI.   BP 110/70   Pulse 95   Temp 98.2 F (36.8 C) (Temporal)   Resp 16   Ht 5\' 6"  (1.676 m)   Wt 195 lb (88.5 kg)   SpO2 96%   BMI 31.47 kg/m   Physical Exam Vitals reviewed.  Constitutional:      Appearance: Normal appearance.  HENT:     Head: Normocephalic and atraumatic.     Right Ear: Tympanic membrane normal.     Left Ear: Tympanic membrane normal.  Eyes:     Conjunctiva/sclera: Conjunctivae normal.     Pupils: Pupils are equal, round, and reactive to light.  Cardiovascular:     Rate and Rhythm: Normal rate and regular rhythm.     Pulses: Normal pulses.     Heart sounds: Normal heart sounds.  Pulmonary:     Effort: Pulmonary effort is normal.  Musculoskeletal:     Cervical back: Neck supple.  Neurological:     General: No focal deficit present.      Mental Status: He is alert and oriented to person, place, and time.     Motor: Tremor present.     Comments: Bilateral hand tremor noted, symmetrical. Increases with intent/reaching. No rigidity. Gait within normal limits.   Psychiatric:        Mood and Affect: Mood normal.     Assessment/Plan: 1. Controlled type 2 diabetes mellitus without complication, without long-term current use of insulin (HCC) Recent A1C at 6.7  showing good control. Chol and BP at goal. Will abstract results into chart here. Diabetic foot examination completed and without abnormal findings. Eye examination up-to-date. Will get records to assess immunization status.   2. Hypertension associated with diabetes ([place]) 3. LVH (left ventricular hypertrophy) BP normotensive. Asymptomatic. Continue current regimen. Referral to local Cardiologist placed.  - Ambulatory referral to Cardiology  4. Hyperlipidemia associated with type 2 diabetes mellitus ([place]) Taking statin as directed. Continue. Dietary and exercise recommendations reviewed with patient.   5. Benign prostatic hyperplasia, unspecified whether lower urinary tract symptoms present Doing well with current regimen. Referral placed to local Urologist per patient request.  - Ambulatory referral to Urology  This visit occurred during the SARS-CoV-2 public health emergency.  Safety protocols were in place, including screening questions prior to the visit, additional usage of staff PPE, and extensive cleaning of exam room while observing appropriate contact time as indicated for disinfecting solutions.       Leeanne Rio, PA-C

## 2019-10-19 NOTE — Patient Instructions (Signed)
I have placed a referral for you to Urology and Cardiology to get established with new providers.   Please continue chronic medication regimen. Let me know when you need refills.  I have uploaded recent lab results to your chart.  Everything looks under good control.  We should follow-up regarding diabetes in late November.  Sooner if needed.   I encourage you to increase hydration and the amount of fiber in your diet.  Start a daily probiotic (Align, Culturelle, Digestive Advantage, etc.). If no bowel movement within 24 hours, take 2 Tbs of Milk of Magnesia in a 4 oz glass of warmed prune juice every 2-3 days to help promote bowel movement. If no results within 24 hours, then repeat above regimen, adding a Dulcolax stool softener to regimen. If this does not promote a bowel movement, please call the office.  It was very nice meeting you today. Welcome to [facility]!

## 2019-11-03 ENCOUNTER — Encounter: Payer: Self-pay | Admitting: Physician Assistant

## 2019-11-16 ENCOUNTER — Ambulatory Visit: Payer: Medicare HMO | Admitting: Cardiology

## 2019-11-28 NOTE — Progress Notes (Signed)
Cardiology Office Note:    Date:  11/29/2019   ID:  Danny Hunter, DOB Nov 21, 1940, MRN [id number]  PCP:  Danny Hunter  [facility] Cardiologist:  Danny Bergeron, MD  [facility] Electrophysiologist:  None   Referring MD: Danny Jeans, PA-C    History of Present Illness:    Danny Hunter is a 79 y.o. male with a hx of anxiety, HLD, HTN and DMII who was referred by Danny Noble, PA for evaluation of LVH.  Danny Hunter's note on 10/19/19 reviewed. Patient has history of HTN and LVH on losartan and amlodipine. Was previously followed by Cardiologist who retired and is hoping to establish care. Denies any current chest pain, palpitations, lightheadedness, dizziness, vision changes or frequent headaches. Patient is very active and does zumba, chair yoga, and cardio. Exercises everyday. Lost 30lbs over the past 6-7years. No chest pain, SOB, exertional symptoms. No LE edema. No orthopnea.   Had COVID 6 months ago despite being vaccinated. Had mild symptoms for 2 days. No residual symptoms.  TC 134, HDL 51, LDL 70, TG 64, A1C 6.7.  Past Medical History:  Diagnosis Date  . Anxiety   . Diabetes mellitus   . Enlarged heart   . Hyperlipemia   . Hypertension   . Sleep apnea    on c-pap  . Tubular adenoma of colon    2011    Past Surgical History:  Procedure Laterality Date  . CATARACT EXTRACTION  2019  . TOTAL SHOULDER ARTHROPLASTY  10/22/2011   Procedure: TOTAL SHOULDER ARTHROPLASTY;  Surgeon: Nita Sells, MD;  Location: [place];  Service: Orthopedics;  Laterality: Right;  right total shoulder arthroplasty    Current Medications: Current Meds  Medication Sig  . alfuzosin (UROXATRAL) 10 MG 24 hr tablet Take 10 mg by mouth daily.  Marland Kitchen amLODipine (NORVASC) 5 MG tablet Take 5 mg by mouth daily.  Marland Kitchen atorvastatin (LIPITOR) 40 MG tablet Take 40 mg by mouth daily.  . finasteride (PROSCAR) 5 MG tablet Take 5 mg by mouth daily.  Marland Kitchen losartan  (COZAAR) 100 MG tablet Take 100 mg by mouth daily.   . metFORMIN (GLUCOPHAGE) 500 MG tablet Take 1,000 mg by mouth 2 (two) times daily with a meal.   . spironolactone (ALDACTONE) 25 MG tablet Take 25 mg by mouth daily.     Allergies:   Patient has no known allergies.   Social History   Socioeconomic History  . Marital status: Married    Spouse name: Not on file  . Number of children: Not on file  . Years of education: Not on file  . Highest education level: Not on file  Occupational History  . Occupation: Retired  Tobacco Use  . Smoking status: Never Smoker  . Smokeless tobacco: Never Used  Vaping Use  . Vaping Use: Never used  Substance and Sexual Activity  . Alcohol use: Yes    Alcohol/week: 0.0 standard drinks    Comment: 1 drink a month  . Drug use: No  . Sexual activity: Not Currently  Other Topics Concern  . Not on file  Social History Narrative  . Not on file   Social Determinants of Health   Financial Resource Strain:   . Difficulty of Paying Living Expenses: Not on file  Food Insecurity:   . Worried About Charity fundraiser in the Last Year: Not on file  . Ran Out of Food in the Last Year: Not on file  Transportation  Needs:   . Lack of Transportation (Medical): Not on file  . Lack of Transportation (Non-Medical): Not on file  Physical Activity:   . Days of Exercise per Week: Not on file  . Minutes of Exercise per Session: Not on file  Stress:   . Feeling of Stress : Not on file  Social Connections:   . Frequency of Communication with Friends and Family: Not on file  . Frequency of Social Gatherings with Friends and Family: Not on file  . Attends Religious Services: Not on file  . Active Member of Clubs or Organizations: Not on file  . Attends Archivist Meetings: Not on file  . Marital Status: Not on file     Family History: The patient's family history includes Cancer in his mother and son; Heart disease in his father.  ROS:   Please  see the history of present illness.    Review of Systems  Constitutional: Negative for chills and fever.  HENT: Negative for sinus pain.   Eyes: Negative for double vision.  Respiratory: Negative for cough.   Cardiovascular: Negative for chest pain, palpitations, orthopnea, claudication, leg swelling and PND.  Gastrointestinal: Negative for abdominal pain, heartburn, nausea and vomiting.  Genitourinary: Positive for frequency. Negative for dysuria.  Musculoskeletal: Positive for joint pain and myalgias.  Skin: Negative for rash.  Neurological: Negative for dizziness, sensory change, weakness and headaches.    EKGs/Labs/Other Studies Reviewed:    The following studies were reviewed today: Carotid ultrasound 2011: Findings:    RIGHT CAROTID ARTERY: There is eccentric plaque in the carotid bulb  extending into the proximal internal and external carotid arteries  without high-grade stenosis. A hypoechoic thyroid nodule is  incidentally noted. There are normal wave forms in the  interrogated vessels with no focal aliasing on color Doppler  interrogation.    RIGHT VERTEBRAL ARTERY: Normal flow direction and waveform.    LEFT CAROTID ARTERY: At least one 7 mm left thyroid nodule is  incidentally noted. There is mild plaque in the carotid bulb  extending just beyond the bifurcation without high-grade stenosis.  Normal wave forms with no focal aliasing on color Doppler  interrogation.    LEFT VERTEBRAL ARTERY: Normal flow direction and waveform.    IMPRESSION:    1. 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 The exam does not exclude plaque  ulceration or embolization. Continued surveillance recommended.    2. Bilateral thyroid nodules, incompletely characterized.  Consider dedicated thyroid ultrasound for further evaluation.   EKG:  EKG is  ordered today.  The ekg ordered today demonstrates NSR with HR 95. No ischemia  and no block.  Recent Labs: 06/21/2019: ALT 30; BUN 21; Creatinine 1.0; Hemoglobin 14.1; Platelets 203; Potassium 5.1; Sodium 137  Recent Lipid Panel    Component Value Date/Time   CHOL 134 06/21/2019 0000   TRIG 64 06/21/2019 0000   HDL 51 06/21/2019 0000   LDLCALC 70 06/21/2019 0000     Risk Assessment/Calculations:       Physical Exam:    VS:  BP 136/82   Pulse 100   Ht 5\' 6"  (1.676 m)   Wt 193 lb (87.5 kg)   SpO2 93%   BMI 31.15 kg/m     Wt Readings from Last 3 Encounters:  11/29/19 193 lb (87.5 kg)  10/19/19 195 lb (88.5 kg)  10/11/14 219 lb (99.3 kg)     GEN:  Well nourished, well developed in  no acute distress HEENT: Normal NECK: No JVD; No carotid bruits CARDIAC: RRR, no murmurs, rubs, gallops RESPIRATORY:  Clear to auscultation without rales, wheezing or rhonchi  ABDOMEN: Soft, non-tender, non-distended MUSCULOSKELETAL:  No edema; No deformity  SKIN: Warm and dry NEUROLOGIC:  Alert and oriented x 3 PSYCHIATRIC:  Normal affect   ASSESSMENT:    1. Hypertension, unspecified type   2. LVH (left ventricular hypertrophy)    PLAN:    In order of problems listed above:  # Hypertension: Very well controlled at home at 110-120s/70-80s.  -Continue amlodipine 5mg  -Continue losartan 100mg  daily -Had cough with lisinopril  #HLD: Well controlled with LDL 70, HDL 51, TC 134. -Continue atorvastatin 40mg  daily  Medication Adjustments/Labs and Tests Ordered: Current medicines are reviewed at length with the patient today.  Concerns regarding medicines are outlined above.  Orders Placed This Encounter  Procedures  . EKG 12-Lead   No orders of the defined types were placed in this encounter.   Patient Instructions  Medication Instructions:  Your physician recommends that you continue on your current medications as directed. Please refer to the Current Medication list given to you today.  *If you need a refill on your cardiac medications before your next  appointment, please call your pharmacy*   Lab Work: none If you have labs (blood work) drawn today and your tests are completely normal, you will receive your results only by: Marland Kitchen MyChart Message (if you have MyChart) OR . A paper copy in the mail If you have any lab test that is abnormal or we need to change your treatment, we will call you to review the results.   Testing/Procedures: none   Follow-Up: At [facility], you and your health needs are our priority.  As part of our continuing mission to provide you with exceptional heart care, we have created designated Provider Care Teams.  These Care Teams include your primary Cardiologist (physician) and Advanced Practice Providers (APPs -  Physician Assistants and Nurse Practitioners) who all work together to provide you with the care you need, when you need it.  We recommend signing up for the patient portal called "MyChart".  Sign up information is provided on this After Visit Summary.  MyChart is used to connect with patients for Virtual Visits (Telemedicine).  Patients are able to view lab/test results, encounter notes, upcoming appointments, etc.  Non-urgent messages can be sent to your provider as well.   To learn more about what you can do with MyChart, go to [email or web address].    Your next appointment:   12 month(s)  The format for your next appointment:   In Person  Provider:   You may see Danny Bergeron, MD or one of the following Advanced Practice Providers on your designated Care Team:    Richardson Dopp, PA-C  Robbie Lis, [place]    Other Instructions      Signed, Danny Bergeron, MD  11/29/2019 4:53 PM    [place]

## 2019-11-29 ENCOUNTER — Other Ambulatory Visit: Payer: Self-pay

## 2019-11-29 ENCOUNTER — Encounter: Payer: Self-pay | Admitting: Cardiology

## 2019-11-29 ENCOUNTER — Ambulatory Visit: Payer: Medicare HMO | Admitting: Cardiology

## 2019-11-29 VITALS — BP 136/82 | HR 100 | Ht 66.0 in | Wt 193.0 lb

## 2019-11-29 DIAGNOSIS — E7800 Pure hypercholesterolemia, unspecified: Secondary | ICD-10-CM | POA: Diagnosis not present

## 2019-11-29 DIAGNOSIS — I517 Cardiomegaly: Secondary | ICD-10-CM | POA: Diagnosis not present

## 2019-11-29 DIAGNOSIS — I10 Essential (primary) hypertension: Secondary | ICD-10-CM

## 2019-11-29 NOTE — Patient Instructions (Signed)
Medication Instructions:  Your physician recommends that you continue on your current medications as directed. Please refer to the Current Medication list given to you today.  *If you need a refill on your cardiac medications before your next appointment, please call your pharmacy*   Lab Work: none If you have labs (blood work) drawn today and your tests are completely normal, you will receive your results only by: Marland Kitchen MyChart Message (if you have MyChart) OR . A paper copy in the mail If you have any lab test that is abnormal or we need to change your treatment, we will call you to review the results.   Testing/Procedures: none   Follow-Up: At [facility], you and your health needs are our priority.  As part of our continuing mission to provide you with exceptional heart care, we have created designated Provider Care Teams.  These Care Teams include your primary Cardiologist (physician) and Advanced Practice Providers (APPs -  Physician Assistants and Nurse Practitioners) who all work together to provide you with the care you need, when you need it.  We recommend signing up for the patient portal called "MyChart".  Sign up information is provided on this After Visit Summary.  MyChart is used to connect with patients for Virtual Visits (Telemedicine).  Patients are able to view lab/test results, encounter notes, upcoming appointments, etc.  Non-urgent messages can be sent to your provider as well.   To learn more about what you can do with MyChart, go to [email or web address].    Your next appointment:   12 month(s)  The format for your next appointment:   In Person  Provider:   You may see Freada Bergeron, MD or one of the following Advanced Practice Providers on your designated Care Team:    Richardson Dopp, PA-C  Robbie Lis, [place]    Other Instructions

## 2019-12-01 ENCOUNTER — Emergency Department (HOSPITAL_COMMUNITY): Payer: Medicare HMO

## 2019-12-01 ENCOUNTER — Emergency Department (HOSPITAL_COMMUNITY)
Admission: EM | Admit: 2019-12-01 | Discharge: 2019-12-01 | Disposition: A | Discharge status: Home / Self Care | Payer: Medicare HMO | Attending: Emergency Medicine | Admitting: Emergency Medicine

## 2019-12-01 ENCOUNTER — Other Ambulatory Visit: Payer: Self-pay

## 2019-12-01 ENCOUNTER — Encounter: Payer: Self-pay | Admitting: Neurology

## 2019-12-01 DIAGNOSIS — E119 Type 2 diabetes mellitus without complications: Secondary | ICD-10-CM | POA: Diagnosis not present

## 2019-12-01 DIAGNOSIS — G9389 Other specified disorders of brain: Secondary | ICD-10-CM | POA: Diagnosis not present

## 2019-12-01 DIAGNOSIS — Z7984 Long term (current) use of oral hypoglycemic drugs: Secondary | ICD-10-CM | POA: Diagnosis not present

## 2019-12-01 DIAGNOSIS — R2681 Unsteadiness on feet: Secondary | ICD-10-CM | POA: Insufficient documentation

## 2019-12-01 DIAGNOSIS — I6782 Cerebral ischemia: Secondary | ICD-10-CM | POA: Diagnosis not present

## 2019-12-01 DIAGNOSIS — G319 Degenerative disease of nervous system, unspecified: Secondary | ICD-10-CM | POA: Diagnosis not present

## 2019-12-01 DIAGNOSIS — R0902 Hypoxemia: Secondary | ICD-10-CM | POA: Diagnosis not present

## 2019-12-01 DIAGNOSIS — R29810 Facial weakness: Secondary | ICD-10-CM | POA: Diagnosis not present

## 2019-12-01 DIAGNOSIS — Z79899 Other long term (current) drug therapy: Secondary | ICD-10-CM | POA: Diagnosis not present

## 2019-12-01 DIAGNOSIS — I10 Essential (primary) hypertension: Secondary | ICD-10-CM | POA: Insufficient documentation

## 2019-12-01 DIAGNOSIS — R42 Dizziness and giddiness: Secondary | ICD-10-CM | POA: Insufficient documentation

## 2019-12-01 DIAGNOSIS — Z96611 Presence of right artificial shoulder joint: Secondary | ICD-10-CM | POA: Diagnosis not present

## 2019-12-01 DIAGNOSIS — R531 Weakness: Secondary | ICD-10-CM | POA: Diagnosis not present

## 2019-12-01 LAB — CBC
HCT: 47.6 % (ref 39.0–52.0)
Hemoglobin: 15.1 g/dL (ref 13.0–17.0)
MCH: 30.7 pg (ref 26.0–34.0)
MCHC: 31.7 g/dL (ref 30.0–36.0)
MCV: 96.7 fL (ref 80.0–100.0)
Platelets: 214 10*3/uL (ref 150–400)
RBC: 4.92 MIL/uL (ref 4.22–5.81)
RDW: 12.8 % (ref 11.5–15.5)
WBC: 8.1 10*3/uL (ref 4.0–10.5)
nRBC: 0 % (ref 0.0–0.2)

## 2019-12-01 LAB — PROTIME-INR
INR: 1 (ref 0.8–1.2)
Prothrombin Time: 12.9 seconds (ref 11.4–15.2)

## 2019-12-01 LAB — COMPREHENSIVE METABOLIC PANEL
ALT: 20 U/L (ref 0–44)
AST: 18 U/L (ref 15–41)
Albumin: 3.6 g/dL (ref 3.5–5.0)
Alkaline Phosphatase: 51 U/L (ref 38–126)
Anion gap: 7 (ref 5–15)
BUN: 15 mg/dL (ref 8–23)
CO2: 25 mmol/L (ref 22–32)
Calcium: 10.1 mg/dL (ref 8.9–10.3)
Chloride: 108 mmol/L (ref 98–111)
Creatinine, Ser: 1.06 mg/dL (ref 0.61–1.24)
GFR, Estimated: >60 mL/min (ref >60)
Glucose, Bld: 149 mg/dL — ABNORMAL HIGH (ref 70–99)
Potassium: 4.1 mmol/L (ref 3.5–5.1)
Sodium: 140 mmol/L (ref 135–145)
Total Bilirubin: 0.7 mg/dL (ref 0.3–1.2)
Total Protein: 6.4 g/dL — ABNORMAL LOW (ref 6.5–8.1)

## 2019-12-01 LAB — DIFFERENTIAL
Abs Immature Granulocytes: 0.02 10*3/uL (ref 0.00–0.07)
Basophils Absolute: 0 10*3/uL (ref 0.0–0.1)
Basophils Relative: 0 %
Eosinophils Absolute: 0.2 10*3/uL (ref 0.0–0.5)
Eosinophils Relative: 2 %
Immature Granulocytes: 0 %
Lymphocytes Relative: 12 %
Lymphs Abs: 1 10*3/uL (ref 0.7–4.0)
Monocytes Absolute: 0.8 10*3/uL (ref 0.1–1.0)
Monocytes Relative: 10 %
Neutro Abs: 6.1 10*3/uL (ref 1.7–7.7)
Neutrophils Relative %: 76 %

## 2019-12-01 LAB — URINALYSIS, ROUTINE W REFLEX MICROSCOPIC
Bilirubin Urine: NEGATIVE
Glucose, UA: NEGATIVE mg/dL
Hgb urine dipstick: NEGATIVE
Ketones, ur: NEGATIVE mg/dL
Leukocytes,Ua: NEGATIVE
Nitrite: NEGATIVE
Protein, ur: NEGATIVE mg/dL
Specific Gravity, Urine: 1.005 (ref 1.005–1.030)
pH: 5 (ref 5.0–8.0)

## 2019-12-01 LAB — RAPID URINE DRUG SCREEN, HOSP PERFORMED
Amphetamines: NOT DETECTED
Barbiturates: NOT DETECTED
Benzodiazepines: NOT DETECTED
Cocaine: NOT DETECTED
Opiates: NOT DETECTED
Tetrahydrocannabinol: NOT DETECTED

## 2019-12-01 LAB — ETHANOL: Alcohol, Ethyl (B): <10 mg/dL (ref <10)

## 2019-12-01 LAB — APTT: aPTT: 31 seconds (ref 24–36)

## 2019-12-01 MED ORDER — MECLIZINE HCL 50 MG PO TABS: 50.0000 mg | ORAL_TABLET | Freq: Three times a day (TID) | ORAL | 0 refills | Status: DC | PRN

## 2019-12-01 MED ORDER — SODIUM CHLORIDE 0.9 % IV BOLUS
1000.0000 mL | Freq: Once | INTRAVENOUS | Status: AC
  Administered 2019-12-01: 1000 mL via INTRAVENOUS

## 2019-12-01 MED ORDER — MECLIZINE HCL 25 MG PO TABS
25.0000 mg | ORAL_TABLET | Freq: Once | ORAL | Status: AC
  Administered 2019-12-01: 25 mg via ORAL
  Filled 2019-12-01: qty 1

## 2019-12-01 NOTE — ED Triage Notes (Signed)
Patient arrived from home via [facility], patient alert and oriented x4, skin warm and dry to touch, c/o sudden onset of dizziness when he got out of bed yesterday. Got worsen today. Denies any N/V. Initial BP with EMS 190/112. Patient took BP meds today. BP 160/85. Denies HA. CBG-177.  No neuro deficits.  Patient took both covid vaccines.

## 2019-12-01 NOTE — ED Notes (Signed)
Patient noted walking from bathroom, gait steady, no c/o dizziness noted,

## 2019-12-01 NOTE — Discharge Instructions (Signed)
Follow-up with your family doctor and a neurologist in the office.  Please return for worsening unsteadiness on your feet.  Take the meclizine as prescribed.

## 2019-12-01 NOTE — ED Provider Notes (Signed)
[facility] EMERGENCY DEPARTMENT Provider Note   CSN: [id number] Arrival date & time:        History Chief Complaint  Patient presents with  . Dizziness    Danny Hunter is a 79 y.o. male.  79 yo M with a chief complaints of a feeling of unsteadiness when he walks.  This been going on since yesterday.  Has persisted throughout the day.  He was concerned mainly because his blood pressure was elevated.  He checked it yesterday and it was high when he was initially unsteady and then improved and then this morning his blood pressure was again elevated.  Typically runs in the 130s and was as high as 190 at home.  He took his home blood pressure medications and thinks that they have improved.  He denies any headache or neck pain denies head injury denies chest pain shortness of breath cough congestion or fever denies ear pain or fullness.  Denies recent chiropractor visit.  Denies abdominal pain nausea vomiting or diarrhea.  Also he has been eating and drinking normally.  No recent medication changes.  Typically works out at the [employer] times a week.  Denies any change in his vision any one-sided weakness or numbness difficulty with speech or swallowing.  The history is provided by the patient.  Dizziness Quality:  Imbalance Severity:  Moderate Onset quality:  Gradual Duration:  1 day Timing:  Constant Progression:  Unchanged Chronicity:  New Context comment:  Walking Relieved by:  Being still Worsened by:  Being still Ineffective treatments:  None tried Associated symptoms: no chest pain, no diarrhea, no headaches, no palpitations, no shortness of breath and no vomiting        Past Medical History:  Diagnosis Date  . Anxiety   . Diabetes mellitus   . Enlarged heart   . Hyperlipemia   . Hypertension   . Sleep apnea    on c-pap  . Tubular adenoma of colon    2011    Patient Active Problem List   Diagnosis Date Noted  . 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 CATARACT EXTRACTION  2019  . TOTAL SHOULDER ARTHROPLASTY  10/22/2011   Procedure: TOTAL SHOULDER ARTHROPLASTY;  Surgeon: Nita Sells, MD;  Location: [place];  Service: Orthopedics;  Laterality: Right;  right total shoulder arthroplasty       Family History  Problem Relation Age of Onset  . Cancer Mother   . Heart disease Father   . Cancer Son     Social History   Tobacco Use  . Smoking status: Never Smoker  . Smokeless tobacco: Never Used  Vaping Use  . Vaping Use: Never used  Substance Use Topics  . Alcohol use: Yes    Alcohol/week: 0.0 standard drinks    Comment: 1 drink a month  . Drug use: No    Home Medications Prior to Admission medications   Medication Sig Start Date End Date Taking? Authorizing Provider  alfuzosin (UROXATRAL) 10 MG 24 hr tablet Take 10 mg by mouth daily. 06/23/19   [provider]  amLODipine (NORVASC) 5 MG tablet Take 5 mg by mouth daily.    [provider]  atorvastatin (LIPITOR) 40 MG tablet Take 40 mg by mouth daily.    [provider]  finasteride (PROSCAR) 5 MG tablet Take 5 mg by mouth daily. 10/07/19   [provider]  losartan (COZAAR) 100 MG tablet  Take 100 mg by mouth daily.  03/29/14   [provider]  meclizine (ANTIVERT) 50 MG tablet Take 1 tablet (50 mg total) by mouth 3 (three) times daily as needed. 12/01/19   Deno Etienne, DO  metFORMIN (GLUCOPHAGE) 500 MG tablet Take 1,000 mg by mouth 2 (two) times daily with a meal.  03/29/14   [provider]  spironolactone (ALDACTONE) 25 MG tablet Take 25 mg by mouth daily.    [provider]    Allergies    Patient has no known allergies.  Review of Systems   Review of Systems  Constitutional: Negative for chills and fever.  HENT: Negative for congestion and facial swelling.   Eyes: Negative for discharge and visual disturbance.  Respiratory: Negative for  shortness of breath.   Cardiovascular: Negative for chest pain and palpitations.  Gastrointestinal: Negative for abdominal pain, diarrhea and vomiting.  Musculoskeletal: Positive for neck pain (neck pops a couple times a day). Negative for arthralgias and myalgias.  Skin: Negative for color change and rash.  Neurological: Positive for dizziness and light-headedness. Negative for tremors, syncope and headaches.  Psychiatric/Behavioral: Negative for confusion and dysphoric mood.    Physical Exam Updated Vital Signs BP 136/78   Pulse 73   Temp 97.9 F (36.6 C) (Oral)   Resp 15   Ht 5\' 6"  (1.676 m)   Wt 86.2 kg   SpO2 96%   BMI 30.67 kg/m   Physical Exam Vitals and nursing note reviewed.  Constitutional:      Appearance: He is well-developed.  HENT:     Head: Normocephalic and atraumatic.  Eyes:     Pupils: Pupils are equal, round, and reactive to light.  Neck:     Vascular: No JVD.  Cardiovascular:     Rate and Rhythm: Normal rate and regular rhythm.     Heart sounds: No murmur heard.  No friction rub. No gallop.   Pulmonary:     Effort: No respiratory distress.     Breath sounds: No wheezing.  Abdominal:     General: There is no distension.     Tenderness: There is no guarding or rebound.  Musculoskeletal:        General: Normal range of motion.     Cervical back: Normal range of motion and neck supple.  Skin:    Coloration: Skin is not pale.     Findings: No rash.  Neurological:     Mental Status: He is alert and oriented to person, place, and time.     Comments: Subtle slight difference in elevation of his palate, slightly unsteady with gait though able to ambulate independently without issue.  Able to turn quickly at the door without losing his balance.  Had 1 movements where he passed pointed with his left upper extremity.  Otherwise benign neurologic exam.  Psychiatric:        Behavior: Behavior normal.     ED Results / Procedures / Treatments   Labs (all  labs ordered are listed, but only abnormal results are displayed) Labs Reviewed  COMPREHENSIVE METABOLIC PANEL - Abnormal; Notable for the following components:      Result Value   Glucose, Bld 149 (*)    Total Protein 6.4 (*)    All other components within normal limits  ETHANOL  PROTIME-INR  APTT  CBC  DIFFERENTIAL  RAPID URINE DRUG SCREEN, HOSP PERFORMED  URINALYSIS, ROUTINE W REFLEX MICROSCOPIC    EKG EKG Interpretation  Date/Time:  Wednesday December 01 2019 08:21:12 EDT Ventricular Rate:  73 PR Interval:    QRS Duration: 86 QT Interval:  383 QTC Calculation: 422 R Axis:   33 Text Interpretation: Sinus rhythm Short PR interval Probable left atrial enlargement Probable anteroseptal infarct, old No significant change since last tracing Confirmed by Deno Etienne [phone]) on 12/01/2019 8:37:16 AM   Radiology CT HEAD WO CONTRAST  Result Date: 12/01/2019 CLINICAL DATA:  Focal neuro deficit, greater than 6 hours, stroke suspected. Additional history provided: Dizziness for 2 days, unsteady gait, elevated blood pressure. EXAM: CT HEAD WITHOUT CONTRAST TECHNIQUE: Contiguous axial images were obtained from the base of the skull through the vertex without intravenous contrast. COMPARISON:  Brain MRI 05/16/2003.  Head CT 05/11/2003. FINDINGS: Brain: Mild generalized cerebral atrophy. Mild ill-defined hypoattenuation within the cerebral white matter is nonspecific, but compatible with chronic small vessel ischemic disease. There is no acute intracranial hemorrhage. No demarcated cortical infarct. No extra-axial fluid collection. No evidence of intracranial mass. No midline shift. Vascular: No hyperdense vessel.  Atherosclerotic calcifications. Skull: Normal. Negative for fracture or focal lesion. Sinuses/Orbits: Visualized orbits show no acute finding. Mild ethmoid sinus mucosal thickening. No significant mastoid effusion. IMPRESSION: No evidence of acute intracranial abnormality. Mild cerebral  atrophy and chronic small vessel ischemic disease, progressed as compared to prior examinations in 2005. Electronically Signed   By: Kellie Simmering DO   On: 12/01/2019 09:17   MR BRAIN WO CONTRAST  Result Date: 12/01/2019 CLINICAL DATA:  Dizziness, nonspecific. EXAM: MRI HEAD WITHOUT CONTRAST TECHNIQUE: Multiplanar, multiecho pulse sequences of the brain and surrounding structures were obtained without intravenous contrast. COMPARISON:  12/01/2019 head CT.  05/16/2003 MRI head. FINDINGS: Brain: No diffusion-weighted signal abnormality. No intracranial hemorrhage. No midline shift, ventriculomegaly or extra-axial fluid collection. No mass lesion. Mild diffuse parenchymal volume loss with ex vacuo dilatation. Scattered and confluent T2/FLAIR hyperintense foci involving the supratentorial white matter and pons are nonspecific however commonly associated with chronic microvascular ischemic changes. Chronic left thalamic lacunar insult. Vascular: Proximally preserved major intracranial flow voids. 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 Sinuses/Orbits: Sequela of bilateral lens replacement. Pneumatized paranasal sinuses and mastoid air cells. Other: None. IMPRESSION: No acute intracranial process. Chronic left thalamic lacunar insult. Mild cerebral atrophy and chronic microvascular ischemic changes. Electronically Signed   By: Primitivo Gauze M.D.   On: 12/01/2019 11:10    Procedures Procedures (including critical care time)  Medications Ordered in ED Medications  sodium chloride 0.9 % bolus 1,000 mL (0 mLs Intravenous Stopped 12/01/19 1112)  meclizine (ANTIVERT) tablet 25 mg (25 mg Oral Given 12/01/19 0825)    ED Course  I have reviewed the triage vital signs and the nursing notes.  Pertinent labs & imaging results that were available during my care of the patient were reviewed by me and considered in my medical decision making (see chart for  details).  Clinical Course as of Nov 30 1125  Wed Dec 01, 2019  1018 Lab work without concerning finding no significant anemia no significant electrolyte abnormality.  Urine is negative for infection.  CT the head without acute intracranial finding.  Reassess the patient post meclizine and fluids.  Denies any improvement of his feeling of unsteadiness.  Order an MRI.   [DF]    Clinical Course User Index [DF] Deno Etienne, DO   MDM Rules/Calculators/A&P  79 yo M with a chief complaints of unsteadiness.  This started yesterday.  Patient is able to ambulate without issue here.  No obvious neurologic deficit on exam.  Will obtain a laboratory evaluation CT of the head.  MRI of the head is also negative.  Patient continues to be able to ambulate.  No significant improvement with meclizine.  Will write a prescription for meclizine.  We will have him follow-up with neurology as an outpatient.  11:27 AM:  I have discussed the diagnosis/risks/treatment options with the patient and family and believe the pt to be eligible for discharge home to follow-up with Neuro. We also discussed returning to the ED immediately if new or worsening sx occur. We discussed the sx which are most concerning (e.g., sudden worsening pain, fever, inability to tolerate by mouth) that necessitate immediate return. Medications administered to the patient during their visit and any new prescriptions provided to the patient are listed below.  Medications given during this visit Medications  sodium chloride 0.9 % bolus 1,000 mL (0 mLs Intravenous Stopped 12/01/19 1112)  meclizine (ANTIVERT) tablet 25 mg (25 mg Oral Given 12/01/19 0825)     The patient appears reasonably screen and/or stabilized for discharge and I doubt any other medical condition or other [facility] requiring further screening, evaluation, or treatment in the ED at this time prior to discharge.   Final Clinical Impression(s) / ED  Diagnoses Final diagnoses:  Unsteadiness on feet    Rx / DC Orders ED Discharge Orders         Ordered    meclizine (ANTIVERT) 50 MG tablet  3 times daily PRN        12/01/19 1123    Ambulatory referral to Neurology       Comments: Acute unsteadiness of gait   12/01/19 [place], [place], DO 12/01/19 1127

## 2019-12-14 ENCOUNTER — Encounter: Payer: Self-pay | Admitting: Physician Assistant

## 2019-12-14 ENCOUNTER — Ambulatory Visit (INDEPENDENT_AMBULATORY_CARE_PROVIDER_SITE_OTHER): Payer: Medicare HMO | Admitting: Physician Assistant

## 2019-12-14 ENCOUNTER — Other Ambulatory Visit: Payer: Self-pay

## 2019-12-14 VITALS — BP 120/64 | HR 78 | Temp 98.2°F | Resp 16 | Ht 66.0 in | Wt 193.0 lb

## 2019-12-14 DIAGNOSIS — Z0000 Encounter for general adult medical examination without abnormal findings: Secondary | ICD-10-CM

## 2019-12-14 DIAGNOSIS — E119 Type 2 diabetes mellitus without complications: Secondary | ICD-10-CM | POA: Diagnosis not present

## 2019-12-14 DIAGNOSIS — E1159 Type 2 diabetes mellitus with other circulatory complications: Secondary | ICD-10-CM

## 2019-12-14 DIAGNOSIS — N400 Enlarged prostate without lower urinary tract symptoms: Secondary | ICD-10-CM

## 2019-12-14 DIAGNOSIS — Z125 Encounter for screening for malignant neoplasm of prostate: Secondary | ICD-10-CM | POA: Diagnosis not present

## 2019-12-14 DIAGNOSIS — I152 Hypertension secondary to endocrine disorders: Secondary | ICD-10-CM

## 2019-12-14 DIAGNOSIS — E1169 Type 2 diabetes mellitus with other specified complication: Secondary | ICD-10-CM

## 2019-12-14 DIAGNOSIS — E785 Hyperlipidemia, unspecified: Secondary | ICD-10-CM | POA: Diagnosis not present

## 2019-12-14 DIAGNOSIS — Z23 Encounter for immunization: Secondary | ICD-10-CM | POA: Diagnosis not present

## 2019-12-14 LAB — COMPREHENSIVE METABOLIC PANEL
ALT: 15 U/L (ref 0–53)
AST: 16 U/L (ref 0–37)
Albumin: 4.5 g/dL (ref 3.5–5.2)
Alkaline Phosphatase: 53 U/L (ref 39–117)
BUN: 21 mg/dL (ref 6–23)
CO2: 29 mEq/L (ref 19–32)
Calcium: 10.4 mg/dL (ref 8.4–10.5)
Chloride: 100 mEq/L (ref 96–112)
Creatinine, Ser: 1.04 mg/dL (ref 0.40–1.50)
GFR: 67.77 mL/min (ref >60.00)
Glucose, Bld: 139 mg/dL — ABNORMAL HIGH (ref 70–99)
Potassium: 5.1 mEq/L (ref 3.5–5.1)
Sodium: 136 mEq/L (ref 135–145)
Total Bilirubin: 0.9 mg/dL (ref 0.2–1.2)
Total Protein: 6.6 g/dL (ref 6.0–8.3)

## 2019-12-14 LAB — LIPID PANEL
Cholesterol: 131 mg/dL (ref 0–200)
HDL: 44 mg/dL (ref >39.00)
LDL Cholesterol: 61 mg/dL (ref 0–99)
NonHDL: 86.93
Total CHOL/HDL Ratio: 3
Triglycerides: 131 mg/dL (ref 0.0–149.0)
VLDL: 26.2 mg/dL (ref 0.0–40.0)

## 2019-12-14 LAB — PSA, MEDICARE: PSA: 2.43 ng/ml (ref 0.10–4.00)

## 2019-12-14 LAB — HEMOGLOBIN A1C: Hgb A1c MFr Bld: 6.9 % — ABNORMAL HIGH (ref 4.6–6.5)

## 2019-12-14 MED ORDER — FINASTERIDE 5 MG PO TABS: 5.0000 mg | ORAL_TABLET | Freq: Every day | ORAL | 1 refills | Status: DC

## 2019-12-14 MED ORDER — LOSARTAN POTASSIUM 100 MG PO TABS: 100.0000 mg | ORAL_TABLET | Freq: Every day | ORAL | 1 refills | Status: DC

## 2019-12-14 MED ORDER — ATORVASTATIN CALCIUM 40 MG PO TABS: 40.0000 mg | ORAL_TABLET | Freq: Every day | ORAL | 1 refills | Status: DC

## 2019-12-14 MED ORDER — AMLODIPINE BESYLATE 5 MG PO TABS: 5.0000 mg | ORAL_TABLET | Freq: Every day | ORAL | 1 refills | Status: DC

## 2019-12-14 NOTE — Patient Instructions (Signed)
Please go to the lab for blood work.   Our office will call you with your results unless you have chosen to receive results via MyChart.  If your blood work is normal we will follow-up each year for physicals and as scheduled for chronic medical problems.  If anything is abnormal we will treat accordingly and get you in for a follow-up.  You will be contacted to schedule an appointment with urology. Follow-up with your other specialists as scheduled.   For the knee and hip -- take a break from the [facility] over the next week. Apply topical OTC Voltaren to the areas to help calm inflammation. If not improving we can get a Sports medicine assessment.    Preventive Care 26 Years and Older, Male Preventive care refers to lifestyle choices and visits with your health care provider that can promote health and wellness. This includes:  A yearly physical exam. This is also called an annual well check.  Regular dental and eye exams.  Immunizations.  Screening for certain conditions.  Healthy lifestyle choices, such as diet and exercise. What can I expect for my preventive care visit? Physical exam Your health care provider will check:  Height and weight. These may be used to calculate body mass index (BMI), which is a measurement that tells if you are at a healthy weight.  Heart rate and blood pressure.  Your skin for abnormal spots. Counseling Your health care provider may ask you questions about:  Alcohol, tobacco, and drug use.  Emotional well-being.  Home and relationship well-being.  Sexual activity.  Eating habits.  History of falls.  Memory and ability to understand (cognition).  Work and work Statistician. What immunizations do I need?  Influenza (flu) vaccine  This is recommended every year. Tetanus, diphtheria, and pertussis (Tdap) vaccine  You may need a Td booster every 10 years. Varicella (chickenpox) vaccine  You may need this vaccine if you have not  already been vaccinated. Zoster (shingles) vaccine  You may need this after age 24. Pneumococcal conjugate (PCV13) vaccine  One dose is recommended after age 48. Pneumococcal polysaccharide (PPSV23) vaccine  One dose is recommended after age 71. Measles, mumps, and rubella (MMR) vaccine  You may need at least one dose of MMR if you were born in 1957 or later. You may also need a second dose. Meningococcal conjugate (MenACWY) vaccine  You may need this if you have certain conditions. Hepatitis A vaccine  You may need this if you have certain conditions or if you travel or work in places where you may be exposed to hepatitis A. Hepatitis B vaccine  You may need this if you have certain conditions or if you travel or work in places where you may be exposed to hepatitis B. Haemophilus influenzae type b (Hib) vaccine  You may need this if you have certain conditions. You may receive vaccines as individual doses or as more than one vaccine together in one shot (combination vaccines). Talk with your health care provider about the risks and benefits of combination vaccines. What tests do I need? Blood tests  Lipid and cholesterol levels. These may be checked every 5 years, or more frequently depending on your overall health.  Hepatitis C test.  Hepatitis B test. Screening  Lung cancer screening. You may have this screening every year starting at age 31 if you have a 30-pack-year history of smoking and currently smoke or have quit within the past 15 years.  Colorectal cancer screening. All adults  should have this screening starting at age 12 and continuing until age 67. Your health care provider may recommend screening at age 22 if you are at increased risk. You will have tests every 1-10 years, depending on your results and the type of screening test.  Prostate cancer screening. Recommendations will vary depending on your family history and other risks.  Diabetes screening. This is  done by checking your blood sugar (glucose) after you have not eaten for a while (fasting). You may have this done every 1-3 years.  Abdominal aortic aneurysm (AAA) screening. You may need this if you are a current or former smoker.  Sexually transmitted disease (STD) testing. Follow these instructions at home: Eating and drinking  Eat a diet that includes fresh fruits and vegetables, whole grains, lean protein, and low-fat dairy products. Limit your intake of foods with high amounts of sugar, saturated fats, and salt.  Take vitamin and mineral supplements as recommended by your health care provider.  Do not drink alcohol if your health care provider tells you not to drink.  If you drink alcohol: ? Limit how much you have to 0-2 drinks a day. ? Be aware of how much alcohol is in your drink. In the [place] one drink equals one 12 oz bottle of beer (355 mL), one 5 oz glass of wine (148 mL), or one 1 oz glass of hard liquor (44 mL). Lifestyle  Take daily care of your teeth and gums.  Stay active. Exercise for at least 30 minutes on 5 or more days each week.  Do not use any products that contain nicotine or tobacco, such as cigarettes, e-cigarettes, and chewing tobacco. If you need help quitting, ask your health care provider.  If you are sexually active, practice safe sex. Use a condom or other form of protection to prevent STIs (sexually transmitted infections).  Talk with your health care provider about taking a low-dose aspirin or statin. What's next?  Visit your health care provider once a year for a well check visit.  Ask your health care provider how often you should have your eyes and teeth checked.  Stay up to date on all vaccines. This information is not intended to replace advice given to you by your health care provider. Make sure you discuss any questions you have with your health care provider. Document Revised: 01/15/2018 Document Reviewed: 01/15/2018 Elsevier Patient  Education  2020 [employer].

## 2019-12-14 NOTE — Progress Notes (Signed)
Patient presents to clinic today for annual exam.  Patient is fasting for labs.  Acute Concerns: Denies acute concerns today.   Health Maintenance: Immunizations -- Flu shot and COVID up-to-date..  Colon Cancer Screening -- up-to-date  Past Medical History:  Diagnosis Date  . Anxiety   . Diabetes mellitus   . Enlarged heart   . Hyperlipemia   . Hypertension   . Sleep apnea    on c-pap  . Tubular adenoma of colon    2011    Past Surgical History:  Procedure Laterality Date  . CATARACT EXTRACTION  2019  . 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 alfuzosin (UROXATRAL) 10 MG 24 hr tablet Take 10 mg by mouth daily.    Marland Kitchen amLODipine (NORVASC) 5 MG tablet Take 5 mg by mouth daily.    Marland Kitchen atorvastatin (LIPITOR) 40 MG tablet Take 40 mg by mouth daily.    . finasteride (PROSCAR) 5 MG tablet Take 5 mg by mouth daily.    Marland Kitchen losartan (COZAAR) 100 MG tablet Take 100 mg by mouth daily.     . meclizine (ANTIVERT) 50 MG tablet Take 1 tablet (50 mg total) by mouth 3 (three) times daily as needed. 30 tablet 0  . metFORMIN (GLUCOPHAGE) 500 MG tablet Take 1,000 mg by mouth 2 (two) times daily with a meal.     . spironolactone (ALDACTONE) 25 MG tablet Take 25 mg by mouth daily.     No current facility-administered medications on file prior to visit.    No Known Allergies  Family History  Problem Relation Age of Onset  . Cancer Mother   . Heart disease Father   . Cancer Son     Social History   Socioeconomic History  . Marital status: Married    Spouse name: Not on file  . Number of children: Not on file  . Years of education: Not on file  . Highest education level: Not on file  Occupational History  . Occupation:  Retired  Tobacco Use  . Smoking status: Never Smoker  . Smokeless tobacco: Never Used  Vaping Use  . Vaping Use: Never used  Substance and Sexual Activity  . Alcohol use: Yes    Alcohol/week: 0.0 standard drinks    Comment: 1 drink a month  . Drug use: No  . Sexual activity: Not Currently  Other Topics Concern  . Not on file  Social History Narrative  . Not on file   Social Determinants of Health   Financial Resource Strain:   . Difficulty of Paying Living Expenses: Not on file  Food Insecurity:   . Worried About Charity fundraiser in the Last Year: Not on file  . Ran Out of Food in the Last Year: Not on file  Transportation Needs:   . Lack of Transportation (Medical): Not on file  . Lack of Transportation (Non-Medical): Not on file  Physical Activity:   . Days of Exercise per Week: Not on file  . Minutes of Exercise per Session: Not on file  Stress:   . Feeling of Stress : Not on file  Social Connections:   . Frequency of Communication with Friends and Family: Not on file  . Frequency of Social  Gatherings with Friends and Family: Not on file  . Attends Religious Services: Not on file  . Active Member of Clubs or Organizations: Not on file  . Attends Archivist Meetings: Not on file  . Marital Status: Not on file  Intimate Partner Violence:   . Fear of Current or Ex-Partner: Not on file  . Emotionally Abused: Not on file  . Physically Abused: Not on file  . Sexually Abused: Not on file   Review of Systems  Constitutional: Negative for fever and weight loss.  HENT: Negative for ear discharge, ear pain, hearing loss and tinnitus.   Eyes: Negative for blurred vision, double vision, photophobia and pain.  Respiratory: Negative for cough and shortness of breath.   Cardiovascular: Negative for chest pain and palpitations.  Gastrointestinal: Negative for abdominal pain, blood in stool, constipation, diarrhea, heartburn, melena, nausea and vomiting.    Genitourinary: Negative for dysuria, flank pain, frequency, hematuria and urgency.  Musculoskeletal: Negative for falls.  Neurological: Negative for dizziness, loss of consciousness and headaches.  Endo/Heme/Allergies: Negative for environmental allergies.  Psychiatric/Behavioral: Negative for depression, hallucinations, substance abuse and suicidal ideas. The patient is not nervous/anxious and does not have insomnia.     Resp 16   Ht 5\' 6"  (1.676 m)   Wt 193 lb (87.5 kg)   BMI 31.15 kg/m   Physical Exam Vitals reviewed.  Constitutional:      General: He is not in acute distress.    Appearance: He is well-developed. He is not diaphoretic.  HENT:     Head: Normocephalic and atraumatic.     Right Ear: Tympanic membrane, ear canal and external ear normal.     Left Ear: Tympanic membrane, ear canal and external ear normal.     Nose: Nose normal.     Mouth/Throat:     Pharynx: No posterior oropharyngeal erythema.  Eyes:     Conjunctiva/sclera: Conjunctivae normal.     Pupils: Pupils are equal, round, and reactive to light.  Neck:     Thyroid: No thyromegaly.  Cardiovascular:     Rate and Rhythm: Normal rate and regular rhythm.     Heart sounds: Normal heart sounds.  Pulmonary:     Effort: Pulmonary effort is normal. No respiratory distress.     Breath sounds: Normal breath sounds. No wheezing or rales.  Chest:     Chest wall: No tenderness.  Abdominal:     General: Bowel sounds are normal. There is no distension.     Palpations: Abdomen is soft. There is no mass.     Tenderness: There is no abdominal tenderness. There is no guarding or rebound.  Musculoskeletal:     Cervical back: Neck supple.  Lymphadenopathy:     Cervical: No cervical adenopathy.  Skin:    General: Skin is warm and dry.     Findings: No rash.  Neurological:     Mental Status: He is alert and oriented to person, place, and time.     Cranial Nerves: No cranial nerve deficit.     Recent Results  (from the past 2160 hour(s))  Ethanol     Status: None   Collection Time: 12/01/19  7:58 AM  Result Value Ref Range   Alcohol, Ethyl (B) <10 <10 mg/dL    Comment: (NOTE) Lowest detectable limit for serum alcohol is 10 mg/dL.  For medical purposes only. Performed at [facility], [place] [address], [place], [place] [address]   Protime-INR  Status: None   Collection Time: 12/01/19  7:58 AM  Result Value Ref Range   Prothrombin Time 12.9 11.4 - 15.2 seconds   INR 1.0 0.8 - 1.2    Comment: (NOTE) INR goal varies based on device and disease states. Performed at [facility], [place] [address], [place] [address]   APTT     Status: None   Collection Time: 12/01/19  7:58 AM  Result Value Ref Range   aPTT 31 24 - 36 seconds    Comment: Performed at [facility] [address] [place] [address]  CBC     Status: None   Collection Time: 12/01/19  7:58 AM  Result Value Ref Range   WBC 8.1 4.0 - 10.5 K/uL   RBC 4.92 4.22 - 5.81 MIL/uL   Hemoglobin 15.1 13.0 - 17.0 g/dL   HCT 47.6 39 - 52 %   MCV 96.7 80.0 - 100.0 fL   MCH 30.7 26.0 - 34.0 pg   MCHC 31.7 30.0 - 36.0 g/dL   RDW 12.8 11.5 - 15.5 %   Platelets 214 150 - 400 K/uL   nRBC 0.0 0.0 - 0.2 %    Comment: Performed at [facility], [place] [address], [place], [place] [address]  Differential     Status: None   Collection Time: 12/01/19  7:58 AM  Result Value Ref Range   Neutrophils Relative % 76 %   Neutro Abs 6.1 1.7 - 7.7 K/uL   Lymphocytes Relative 12 %   Lymphs Abs 1.0 0.7 - 4.0 K/uL   Monocytes Relative 10 %   Monocytes Absolute 0.8 0.1 - 1.0 K/uL   Eosinophils Relative 2 %   Eosinophils Absolute 0.2 0.0 - 0.5 K/uL   Basophils Relative 0 %   Basophils Absolute 0.0 0.0 - 0.1 K/uL   Immature Granulocytes 0 %   Abs Immature Granulocytes 0.02 0.00 - 0.07 K/uL    Comment: Performed at [facility], [place] [address], [place], [place] [address]  Comprehensive metabolic panel      Status: Abnormal   Collection Time: 12/01/19  7:58 AM  Result Value Ref Range   Sodium 140 135 - 145 mmol/L   Potassium 4.1 3.5 - 5.1 mmol/L   Chloride 108 98 - 111 mmol/L   CO2 25 22 - 32 mmol/L   Glucose, Bld 149 (H) 70 - 99 mg/dL    Comment: Glucose reference range applies only to samples taken after fasting for at least 8 hours.   BUN 15 8 - 23 mg/dL   Creatinine, Ser 1.06 0.61 - 1.24 mg/dL   Calcium 10.1 8.9 - 10.3 mg/dL   Total Protein 6.4 (L) 6.5 - 8.1 g/dL   Albumin 3.6 3.5 - 5.0 g/dL   AST 18 15 - 41 U/L   ALT 20 0 - 44 U/L   Alkaline Phosphatase 51 38 - 126 U/L   Total Bilirubin 0.7 0.3 - 1.2 mg/dL   GFR, Estimated >60 >60 mL/min    Comment: (NOTE) Calculated using the CKD-EPI Creatinine Equation (2021)    Anion gap 7 5 - 15    Comment: Performed at [facility] [address] [place], [place] [address]  Urine rapid drug screen (hosp performed)     Status: None   Collection Time: 12/01/19  9:15 AM  Result Value Ref Range   Opiates NONE DETECTED NONE DETECTED   Cocaine NONE DETECTED NONE DETECTED  Benzodiazepines NONE DETECTED NONE DETECTED   Amphetamines NONE DETECTED NONE DETECTED   Tetrahydrocannabinol NONE DETECTED NONE DETECTED   Barbiturates NONE DETECTED NONE DETECTED    Comment: (NOTE) DRUG SCREEN FOR MEDICAL PURPOSES ONLY.  IF CONFIRMATION IS NEEDED FOR ANY PURPOSE, NOTIFY LAB WITHIN 5 DAYS.  LOWEST DETECTABLE LIMITS FOR URINE DRUG SCREEN Drug Class                     Cutoff (ng/mL) Amphetamine and metabolites    1000 Barbiturate and metabolites    200 Benzodiazepine                 366 Tricyclics and metabolites     300 Opiates and metabolites        300 Cocaine and metabolites        300 THC                            50 Performed at [facility], [place] [address], [place], [place] [address]   Urinalysis, Routine w reflex microscopic Urine, Clean Catch     Status: None   Collection Time: 12/01/19  9:16 AM  Result Value Ref Range    Color, Urine YELLOW YELLOW   APPearance CLEAR CLEAR   Specific Gravity, Urine 1.005 1.005 - 1.030   pH 5.0 5.0 - 8.0   Glucose, UA NEGATIVE NEGATIVE mg/dL   Hgb urine dipstick NEGATIVE NEGATIVE   Bilirubin Urine NEGATIVE NEGATIVE   Ketones, ur NEGATIVE NEGATIVE mg/dL   Protein, ur NEGATIVE NEGATIVE mg/dL   Nitrite NEGATIVE NEGATIVE   Leukocytes,Ua NEGATIVE NEGATIVE    Comment: Performed at [facility] [address] [place] [address], [place], [place] [address]    Assessment/Plan: 1. Visit for preventive health examination Depression screen negative. Health Maintenance reviewed -- Prevnar updated today. Preventive schedule discussed and handout given in AVS. Will obtain fasting labs today.  2. Controlled type 2 diabetes mellitus without complication, without long-term current use of insulin ([facility]) Repeat labs today to reassess. Eye exam and foot exam up-to-date. Continue ARB and statin. - Comprehensive metabolic panel - Hemoglobin A1c - Lipid panel  3. Hypertension associated with diabetes ([facility]) BP normotensive. Asymptomatic. Followed by Cardiology. Continue management per specialist.  - Comprehensive metabolic panel  4. Hyperlipidemia associated with type 2 diabetes mellitus ([place]) Taking medications as directed. Repeat fasting labs today.  - Comprehensive metabolic panel - Hemoglobin A1c - Lipid panel  5. Benign prostatic hyperplasia, unspecified whether lower urinary tract symptoms present Referral to urology placed. Continue finasteride. Repeat PSA levels today.  - PSA, Medicare  6. Prostate cancer screening He indicates understanding of the limitations of this screening test and wishes to proceed with screening PSA testing.  - PSA, Medicare  This visit occurred during the SARS-CoV-2 public health emergency.  Safety protocols were in place, including screening questions prior to the visit, additional usage of staff PPE, and extensive cleaning of exam room while observing  appropriate contact time as indicated for disinfecting solutions.    Leeanne Rio, PA-C

## 2019-12-14 NOTE — Addendum Note (Signed)
Addended by: Brunetta Jeans on: 12/14/2019 10:22 AM   Modules accepted: Orders

## 2019-12-14 NOTE — Addendum Note (Signed)
Addended by: Leonidas Romberg on: 12/14/2019 09:53 AM   Modules accepted: Orders

## 2019-12-27 ENCOUNTER — Telehealth: Payer: Self-pay | Admitting: Emergency Medicine

## 2019-12-27 NOTE — Telephone Encounter (Signed)
[facility] RECORD [facility]urse Patient Name: Danny Hunter Gender: Male DOB: 1940-07-03 Age: 79 Y 68 M 16 D Return Phone Number: [phone] (Primary) Address: City/State/Zip: [place] [address] Client [facility] - Cli Client Site [place] - Day Physician Elyn Aquas - [place] Contact Type Call Who Is Calling Patient / Member / Family / Caregiver Call Type Triage / Clinical Relationship To Patient Self Return Phone Number [phone] (Primary) Chief Complaint Rectal Bleeding Reason for Call Symptomatic / Request for [place] states he has rectal bleeding. Translation No Nurse Assessment Nurse: Lucky Cowboy, RN, Levada Dy Date/Time (Eastern Time): 12/27/2019 8:49:06 AM Confirm and document reason for call. If symptomatic, describe symptoms. ---Caller stated that he is having rectal bleeding and the toilet water turns red. Does the patient have any new or worsening symptoms? ---Yes Will a triage be completed? ---Yes Related visit to physician within the last 2 weeks? ---Yes Does the PT have any chronic conditions? (i.e. diabetes, asthma, this includes High risk factors for pregnancy, etc.) ---Yes List chronic conditions. ---"see chart" Is this a behavioral health or substance abuse call? ---No Guidelines Guideline Title Affirmed Question Affirmed Notes Nurse Date/Time Eilene Ghazi Time) Rectal Bleeding [1] MODERATE rectal bleeding (small blood clots, passing blood without stool, or toilet water turns red) AND [2] more than once a day Dew, RN, Levada Dy 12/27/2019 8:51:17 AM Disp. Time Eilene Ghazi Time) Disposition Final User 12/27/2019 8:52:52 AM Go to ED Now Yes Dew, RN, Marin Shutter Disagree/Comply Disagree PLEASE NOTE: All timestamps contained within this report are represented as [place] Standard Time. CONFIDENTIALTY NOTICE: This fax  transmission is intended only for the addressee. It contains information that is legally privileged, confidential or otherwise protected from use or disclosure. If you are not the intended recipient, you are strictly prohibited from reviewing, disclosing, copying using or disseminating any of this information or taking any action in reliance on or regarding this information. If you have received this fax in error, please notify us immediately by telephone so that we can arrange for its return to [place]. Phone: [phone], Toll-Free: [phone], Fax: [phone] Page: 2 of 2 Call Id: [id number] [place] Understands Yes PreDisposition InappropriateToAsk Care Advice Given Per Guideline GO TO ED NOW: * You need to be seen in the Emergency Department. * Go to the ED at ___________ [facility] now. Drive carefully. CARE ADVICE given per Rectal Bleeding (Adult) guideline. Referrals GO TO FACILITY REFUSED

## 2019-12-27 NOTE — Telephone Encounter (Signed)
Call patient to check on him. See if symptoms are continual or just occasional. Would like to see him in office tomorrow if able. If there is any lightheadedness, dizziness or shortness of breath, or if he is having diarrhea along with blood noted, he needs to be seen today at Urgent Care or ER.

## 2019-12-27 NOTE — Telephone Encounter (Signed)
Left a vm message for the patient to call the office back. 

## 2019-12-28 NOTE — Telephone Encounter (Signed)
Patient scheduled for 11/29

## 2020-01-03 ENCOUNTER — Encounter: Payer: Self-pay | Admitting: Physician Assistant

## 2020-01-03 ENCOUNTER — Other Ambulatory Visit: Payer: Self-pay

## 2020-01-03 ENCOUNTER — Ambulatory Visit (INDEPENDENT_AMBULATORY_CARE_PROVIDER_SITE_OTHER): Payer: Medicare HMO | Admitting: Physician Assistant

## 2020-01-03 VITALS — BP 130/82 | HR 78 | Temp 98.3°F | Resp 14 | Ht 66.0 in | Wt 194.0 lb

## 2020-01-03 DIAGNOSIS — K602 Anal fissure, unspecified: Secondary | ICD-10-CM | POA: Diagnosis not present

## 2020-01-03 DIAGNOSIS — K625 Hemorrhage of anus and rectum: Secondary | ICD-10-CM

## 2020-01-03 NOTE — Progress Notes (Signed)
Patient presents to clinic today c/o episodes of red blood per rectum with hard stool, noting a week ago. Had two episodes of a large amount of bright red blood. Denies melena or tenesmus. Notes again having harder stool. Tries to keep well-hydrated. Denies any lightheadedness, dizziness or SOB.   Past Medical History:  Diagnosis Date  . Anxiety   . Diabetes mellitus   . Enlarged heart   . Hyperlipemia   . Hypertension   . Sleep apnea    on c-pap  . Tubular adenoma of colon    2011    Current Outpatient Medications on File Prior to Visit  Medication Sig Dispense Refill  . alfuzosin (UROXATRAL) 10 MG 24 hr tablet Take 10 mg by mouth daily.    Marland Kitchen amLODipine (NORVASC) 5 MG tablet Take 1 tablet (5 mg total) by mouth daily. 90 tablet 1  . atorvastatin (LIPITOR) 40 MG tablet Take 1 tablet (40 mg total) by mouth daily. 90 tablet 1  . finasteride (PROSCAR) 5 MG tablet Take 1 tablet (5 mg total) by mouth daily. 90 tablet 1  . losartan (COZAAR) 100 MG tablet Take 1 tablet (100 mg total) by mouth daily. 90 tablet 1  . meclizine (ANTIVERT) 50 MG tablet Take 1 tablet (50 mg total) by mouth 3 (three) times daily as needed. 30 tablet 0  . metFORMIN (GLUCOPHAGE) 500 MG tablet Take 1,000 mg by mouth 2 (two) times daily with a meal.     . spironolactone (ALDACTONE) 25 MG tablet Take 25 mg by mouth daily.     No current facility-administered medications on file prior to visit.    No Known Allergies  Family History  Problem Relation Age of Onset  . Cancer Mother   . Heart disease Father   . Cancer Son     Social History   Socioeconomic History  . Marital status: Married    Spouse name: Not on file  . Number of children: Not on file  . Years of education: Not on file  . Highest education level: Not on file  Occupational History  . Occupation: Retired  Tobacco Use  . Smoking status: Never Smoker  . Smokeless tobacco: Never Used  Vaping Use  . Vaping Use: Never used  Substance and  Sexual Activity  . Alcohol use: Yes    Alcohol/week: 0.0 standard drinks    Comment: 1 drink a month  . Drug use: No  . Sexual activity: Not Currently  Other Topics Concern  . Not on file  Social History Narrative  . Not on file   Social Determinants of Health   Financial Resource Strain:   . Difficulty of Paying Living Expenses: Not on file  Food Insecurity:   . Worried About Charity fundraiser in the Last Year: Not on file  . Ran Out of Food in the Last Year: Not on file  Transportation Needs:   . Lack of Transportation (Medical): Not on file  . Lack of Transportation (Non-Medical): Not on file  Physical Activity:   . Days of Exercise per Week: Not on file  . Minutes of Exercise per Session: Not on file  Stress:   . Feeling of Stress : Not on file  Social Connections:   . Frequency of Communication with Friends and Family: Not on file  . Frequency of Social Gatherings with Friends and Family: Not on file  . Attends Religious Services: Not on file  . Active Member of Clubs or Organizations:  Not on file  . Attends Archivist Meetings: Not on file  . Marital Status: Not on file    Review of Systems - See HPI.  All other ROS are negative.  BP 130/82   Pulse 78   Temp 98.3 F (36.8 C) (Temporal)   Resp 14   Ht 5\' 6"  (1.676 m)   Wt 194 lb (88 kg)   SpO2 98%   BMI 31.31 kg/m   Physical Exam Vitals reviewed. Exam conducted with a chaperone present.  Constitutional:      Appearance: Normal appearance.  Cardiovascular:     Rate and Rhythm: Normal rate and regular rhythm.     Pulses: Normal pulses.     Heart sounds: Normal heart sounds.  Genitourinary:    Rectum: Guaiac result negative. Anal fissure (with sentinel skin tag) present. No mass, tenderness, external hemorrhoid or internal hemorrhoid. Normal anal tone.  Musculoskeletal:     Cervical back: Neck supple.  Neurological:     General: No focal deficit present.     Mental Status: He is alert and  oriented to person, place, and time.  Psychiatric:        Mood and Affect: Mood normal.     Recent Results (from the past 2160 hour(s))  Ethanol     Status: None   Collection Time: 12/01/19  7:58 AM  Result Value Ref Range   Alcohol, Ethyl (B) <10 <10 mg/dL    Comment: (NOTE) Lowest detectable limit for serum alcohol is 10 mg/dL.  For medical purposes only. Performed at [facility], [place] [address] [place], [place] [address]   Protime-INR     Status: None   Collection Time: 12/01/19  7:58 AM  Result Value Ref Range   Prothrombin Time 12.9 11.4 - 15.2 seconds   INR 1.0 0.8 - 1.2    Comment: (NOTE) INR goal varies based on device and disease states. Performed at [facility], [place] [address], [place] [address]   APTT     Status: None   Collection Time: 12/01/19  7:58 AM  Result Value Ref Range   aPTT 31 24 - 36 seconds    Comment: Performed at [facility] [address], [place] [address]  CBC     Status: None   Collection Time: 12/01/19  7:58 AM  Result Value Ref Range   WBC 8.1 4.0 - 10.5 K/uL   RBC 4.92 4.22 - 5.81 MIL/uL   Hemoglobin 15.1 13.0 - 17.0 g/dL   HCT 47.6 39 - 52 %   MCV 96.7 80.0 - 100.0 fL   MCH 30.7 26.0 - 34.0 pg   MCHC 31.7 30.0 - 36.0 g/dL   RDW 12.8 11.5 - 15.5 %   Platelets 214 150 - 400 K/uL   nRBC 0.0 0.0 - 0.2 %    Comment: Performed at [facility], [place] [address], [place],  [address]  Differential     Status: None   Collection Time: 12/01/19  7:58 AM  Result Value Ref Range   Neutrophils Relative % 76 %   Neutro Abs 6.1 1.7 - 7.7 K/uL   Lymphocytes Relative 12 %   Lymphs Abs 1.0 0.7 - 4.0 K/uL   Monocytes Relative 10 %   Monocytes Absolute 0.8 0.1 - 1.0 K/uL   Eosinophils Relative 2 %   Eosinophils Absolute 0.2 0.0 - 0.5 K/uL   Basophils Relative 0 %  Basophils Absolute 0.0 0.0 - 0.1 K/uL   Immature Granulocytes 0 %   Abs Immature Granulocytes 0.02 0.00 - 0.07 K/uL    Comment:  Performed at [facility], [place] [address] [place], [place] [address]  Comprehensive metabolic panel     Status: Abnormal   Collection Time: 12/01/19  7:58 AM  Result Value Ref Range   Sodium 140 135 - 145 mmol/L   Potassium 4.1 3.5 - 5.1 mmol/L   Chloride 108 98 - 111 mmol/L   CO2 25 22 - 32 mmol/L   Glucose, Bld 149 (H) 70 - 99 mg/dL    Comment: Glucose reference range applies only to samples taken after fasting for at least 8 hours.   BUN 15 8 - 23 mg/dL   Creatinine, Ser 1.06 0.61 - 1.24 mg/dL   Calcium 10.1 8.9 - 10.3 mg/dL   Total Protein 6.4 (L) 6.5 - 8.1 g/dL   Albumin 3.6 3.5 - 5.0 g/dL   AST 18 15 - 41 U/L   ALT 20 0 - 44 U/L   Alkaline Phosphatase 51 38 - 126 U/L   Total Bilirubin 0.7 0.3 - 1.2 mg/dL   GFR, Estimated >60 >60 mL/min    Comment: (NOTE) Calculated using the CKD-EPI Creatinine Equation (2021)    Anion gap 7 5 - 15    Comment: Performed at [facility] [address] [place], [place] [address]  Urine rapid drug screen (hosp performed)     Status: None   Collection Time: 12/01/19  9:15 AM  Result Value Ref Range   Opiates NONE DETECTED NONE DETECTED   Cocaine NONE DETECTED NONE DETECTED   Benzodiazepines NONE DETECTED NONE DETECTED   Amphetamines NONE DETECTED NONE DETECTED   Tetrahydrocannabinol NONE DETECTED NONE DETECTED   Barbiturates NONE DETECTED NONE DETECTED    Comment: (NOTE) DRUG SCREEN FOR MEDICAL PURPOSES ONLY.  IF CONFIRMATION IS NEEDED FOR ANY PURPOSE, NOTIFY LAB WITHIN 5 DAYS.  LOWEST DETECTABLE LIMITS FOR URINE DRUG SCREEN Drug Class                     Cutoff (ng/mL) Amphetamine and metabolites    1000 Barbiturate and metabolites    200 Benzodiazepine                 465 Tricyclics and metabolites     300 Opiates and metabolites        300 Cocaine and metabolites        300 THC                            50 Performed at [facility], [place] [address] [place], [place] [address]   Urinalysis, Routine w reflex  microscopic Urine, Clean Catch     Status: None   Collection Time: 12/01/19  9:16 AM  Result Value Ref Range   Color, Urine YELLOW YELLOW   APPearance CLEAR CLEAR   Specific Gravity, Urine 1.005 1.005 - 1.030   pH 5.0 5.0 - 8.0   Glucose, UA NEGATIVE NEGATIVE mg/dL   Hgb urine dipstick NEGATIVE NEGATIVE   Bilirubin Urine NEGATIVE NEGATIVE   Ketones, ur NEGATIVE NEGATIVE mg/dL   Protein, ur NEGATIVE NEGATIVE mg/dL   Nitrite NEGATIVE NEGATIVE   Leukocytes,Ua NEGATIVE NEGATIVE    Comment: Performed at [facility] [address] [place], [place] [address]  Comprehensive metabolic panel  Status: Abnormal   Collection Time: 12/14/19  9:57 AM  Result Value Ref Range   Sodium 136 135 - 145 mEq/L   Potassium 5.1 3.5 - 5.1 mEq/L   Chloride 100 96 - 112 mEq/L   CO2 29 19 - 32 mEq/L   Glucose, Bld 139 (H) 70 - 99 mg/dL   BUN 21 6 - 23 mg/dL   Creatinine, Ser 1.04 0.40 - 1.50 mg/dL   Total Bilirubin 0.9 0.2 - 1.2 mg/dL   Alkaline Phosphatase 53 39 - 117 U/L   AST 16 0 - 37 U/L   ALT 15 0 - 53 U/L   Total Protein 6.6 6.0 - 8.3 g/dL   Albumin 4.5 3.5 - 5.2 g/dL   GFR 67.77 >60.00 mL/min    Comment: Calculated using the CKD-EPI Creatinine Equation (2021)   Calcium 10.4 8.4 - 10.5 mg/dL  Hemoglobin A1c     Status: Abnormal   Collection Time: 12/14/19  9:57 AM  Result Value Ref Range   Hgb A1c MFr Bld 6.9 (H) 4.6 - 6.5 %    Comment: Glycemic Control Guidelines for People with Diabetes:Non Diabetic:  <6%Goal of Therapy: <7%Additional Action Suggested:  >8%   Lipid panel     Status: None   Collection Time: 12/14/19  9:57 AM  Result Value Ref Range   Cholesterol 131 0 - 200 mg/dL    Comment: ATP III Classification       Desirable:  < 200 mg/dL               Borderline High:  200 - 239 mg/dL          High:  > = 240 mg/dL   Triglycerides 131.0 0 - 149 mg/dL    Comment: Normal:  <150 mg/dLBorderline High:  150 - 199 mg/dL   HDL 44.00 >39.00 mg/dL   VLDL 26.2 0.0 - 40.0 mg/dL   LDL  Cholesterol 61 0 - 99 mg/dL   Total CHOL/HDL Ratio 3     Comment:                Men          Women1/2 Average Risk     3.4          3.3Average Risk          5.0          4.42X Average Risk          9.6          7.13X Average Risk          15.0          11.0                       NonHDL 86.93     Comment: NOTE:  Non-HDL goal should be 30 mg/dL higher than patient's LDL goal (i.e. LDL goal of < 70 mg/dL, would have non-HDL goal of < 100 mg/dL)  PSA, Medicare     Status: None   Collection Time: 12/14/19  9:57 AM  Result Value Ref Range   PSA 2.43 0.10 - 4.00 ng/ml    Comment: Test performed using Access Hybritech PSA Assay, a parmagnetic partical, chemiluminecent immunoassay.   Assessment/Plan: 1. Anal fissure 2. BRBPR (bright red blood per rectum) Anal fissure noted on examination with routine healing.  Sentinel skin tag also noted.  No evidence of hemorrhoid on examination today.  Hemoccult negative.  Suspect blood is  solely from fissure which is secondary to straining during bowel movement.  Bowel regimen reviewed with patient to help facilitate smoother bowel movements and reduce risk of recurring fissure or hemorrhoid development.  Strict return precautions discussed with patient who voiced understanding and agreement with the plan.  This visit occurred during the SARS-CoV-2 public health emergency.  Safety protocols were in place, including screening questions prior to the visit, additional usage of staff PPE, and extensive cleaning of exam room while observing appropriate contact time as indicated for disinfecting solutions.     Leeanne Rio, PA-C

## 2020-01-03 NOTE — Patient Instructions (Signed)
I encourage you to increase hydration and the amount of fiber in your diet.  Start a daily probiotic (Align, Culturelle, Digestive Advantage, etc.). If no bowel movement within 24 hours, take 2 Tbs of Milk of Magnesia in a 4 oz glass of warmed prune juice every 2-3 days to help promote bowel movement. If no results within 24 hours, then repeat above regimen, adding a Dulcolax stool softener to regimen. If this does not promote a bowel movement, please call the office.  The fissure has healed by my examination today. The bowel regimen above will help with preventing a new fissure. If there is recurrence, let me know as we would want to make sure GI was involved.   I am having my coordinator check on the status of your Urology referral. I will have her reach out to you once she finds out something.

## 2020-01-06 NOTE — Progress Notes (Signed)
NEUROLOGY CONSULTATION NOTE  Danny Hunter MRN: [id number] DOB: 1940/02/19  Referring provider: Deno Etienne, DO (ED referral) Primary care provider: Brunetta Jeans, PA-C  Reason for consult: unsteadiness of feet.   Subjective:  Danny Hunter is an 79 year old left-handed male with diabetes, HTN, HLD, sleep apnea who presents for unsteadiness of feet, however he is specifically concerned about tremor.  History supplemented by ED note.  On 11/30/2019, he started feeling unsteady on his feet.  When he checked his blood pressure, it was 197/102.  No headache, neck pain, double vision or dizziness.  He went to the ED the following day for evaluation.  CT head and follow up MRI of brain without contrast personally reviewed showed chronic small vessel ischemic changes with chronic left thalamic lacunar infarct (no clinical history of stroke) but no acute intracranial abnormality.  He has felt better since then.  However, he wanted to be evaluated for tremor.  He has noticed tremor in both hands for at least a year.  He notices it when his hands are outstretched or if he is holding an object such as utensils while eating.  Coffee makes it worse.  No family history of tremor.  He has diabetes.  Labs from 12/14/2019 include Hgb A1c 6.9.  Blood pressure as since been controlled.  He does not take antiplatelet therapy as it caused bleeding in his eye.  PAST MEDICAL HISTORY: Past Medical History:  Diagnosis Date  . Anxiety   . Diabetes mellitus   . Enlarged heart   . Hyperlipemia   . Hypertension   . Sleep apnea    on c-pap  . Tubular adenoma of colon    2011    PAST SURGICAL HISTORY: Past Surgical History:  Procedure Laterality Date  . CATARACT EXTRACTION  2019  . 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 alfuzosin (UROXATRAL) 10 MG 24 hr tablet Take 10 mg by mouth daily.    Marland Kitchen amLODipine (NORVASC) 5 MG tablet Take 1 tablet (5 mg total) by mouth daily. 90 tablet 1  . atorvastatin (LIPITOR) 40 MG tablet Take 1 tablet (40 mg total) by mouth daily. 90 tablet 1  . finasteride (PROSCAR) 5 MG tablet Take 1 tablet (5 mg total) by mouth daily. 90 tablet 1  . losartan (COZAAR) 100 MG tablet Take 1 tablet (100 mg total) by mouth daily. 90 tablet 1  . meclizine (ANTIVERT) 50 MG tablet Take 1 tablet (50 mg total) by mouth 3 (three) times daily as needed. 30 tablet 0  . metFORMIN (GLUCOPHAGE) 500 MG tablet Take 1,000 mg by mouth 2 (two) times daily with a meal.     . spironolactone (ALDACTONE) 25 MG tablet Take 25 mg by mouth daily.     No current facility-administered medications on file prior to visit.    ALLERGIES: No Known Allergies  FAMILY HISTORY: Family History  Problem Relation Age of Onset  . Cancer Mother   . Heart disease Father   . Cancer Son    SOCIAL HISTORY: Social History   Socioeconomic History  . Marital status: Married    Spouse name: Not on file  . Number of children: Not on file  . Years of education: Not  on file  . Highest education level: Not on file  Occupational History  . Occupation: Retired  Tobacco Use  . Smoking status: Never Smoker  . Smokeless tobacco: Never Used  Vaping Use  . Vaping Use: Never used  Substance and Sexual Activity  . Alcohol use: Yes    Alcohol/week: 0.0 standard drinks    Comment: 1 drink a month  . Drug use: No  . Sexual activity: Not Currently  Other Topics Concern  . Not on file  Social History Narrative  . Not on file   Social Determinants of Health   Financial Resource Strain:   . Difficulty of Paying Living Expenses: Not on file  Food Insecurity:   . Worried About Charity fundraiser in the Last Year: Not on file   . Ran Out of Food in the Last Year: Not on file  Transportation Needs:   . Lack of Transportation (Medical): Not on file  . Lack of Transportation (Non-Medical): Not on file  Physical Activity:   . Days of Exercise per Week: Not on file  . Minutes of Exercise per Session: Not on file  Stress:   . Feeling of Stress : Not on file  Social Connections:   . Frequency of Communication with Friends and Family: Not on file  . Frequency of Social Gatherings with Friends and Family: Not on file  . Attends Religious Services: Not on file  . Active Member of Clubs or Organizations: Not on file  . Attends Archivist Meetings: Not on file  . Marital Status: Not on file  Intimate Partner Violence:   . Fear of Current or Ex-Partner: Not on file  . Emotionally Abused: Not on file  . Physically Abused: Not on file  . Sexually Abused: Not on file    Objective:  Blood pressure 138/83, pulse 96, height 5\' 6"  (1.676 m), weight 192 lb 9.6 oz (87.4 kg), SpO2 95 %. General: No acute distress.  Patient appears well-groomed.   Head:  Normocephalic/atraumatic Eyes:  fundi examined but not visualized Neck: supple, no paraspinal tenderness, full range of motion Back: No paraspinal tenderness Heart: regular rate and rhythm Lungs: Clear to auscultation bilaterally. Vascular: No carotid bruits. Neurological Exam: Mental status: alert and oriented to person, place, and time, recent and remote memory intact, fund of knowledge intact, attention and concentration intact, speech fluent and not dysarthric, language intact. Cranial nerves: CN I: not tested CN II: pupils equal, round and reactive to light, visual fields intact CN III, IV, VI:  full range of motion, no nystagmus, no ptosis CN V: facial sensation intact. CN VII: upper and lower face symmetric CN VIII: hearing intact CN IX, X: gag intact, uvula midline CN XI: sternocleidomastoid and trapezius muscles intact CN XII: tongue midline Bulk &  Tone: normal, no fasciculations. Motor:  Fine bilateral postural and kinetic tremor in hands bilaterally, muscle strength 5/5 throughout.  No bradykinesia Sensation:  Pinprick sensation intact; vibratory sensation reduced in feet. Deep Tendon Reflexes:  2+ throughout,  toes downgoing.   Finger to nose testing:  Without dysmetria.   Heel to shin:  Without dysmetria.   Gait:  Normal station and stride.  Able to turn and tandem walk.  Romberg negative.  Assessment/Plan:   1.  Benign essential tremor 2.  Cerebrovascular disease  1.  No need for treatment of tremor as it is minor 2.  Optimize management of stroke risk factors 3.  Follow up as needed.  Thank you for allowing me to take part in the care of this patient.  Metta Clines, DO  CC: Brunetta Jeans, PA-C

## 2020-01-07 ENCOUNTER — Other Ambulatory Visit: Payer: Self-pay

## 2020-01-07 ENCOUNTER — Ambulatory Visit: Payer: Medicare HMO | Admitting: Neurology

## 2020-01-07 ENCOUNTER — Encounter: Payer: Self-pay | Admitting: Neurology

## 2020-01-07 VITALS — BP 138/83 | HR 96 | Ht 66.0 in | Wt 192.6 lb

## 2020-01-07 DIAGNOSIS — I679 Cerebrovascular disease, unspecified: Secondary | ICD-10-CM | POA: Diagnosis not present

## 2020-01-07 DIAGNOSIS — G250 Essential tremor: Secondary | ICD-10-CM

## 2020-01-07 NOTE — Patient Instructions (Signed)

## 2020-01-18 DIAGNOSIS — H43813 Vitreous degeneration, bilateral: Secondary | ICD-10-CM | POA: Diagnosis not present

## 2020-01-18 DIAGNOSIS — Z961 Presence of intraocular lens: Secondary | ICD-10-CM | POA: Diagnosis not present

## 2020-01-18 DIAGNOSIS — H52203 Unspecified astigmatism, bilateral: Secondary | ICD-10-CM | POA: Diagnosis not present

## 2020-01-18 DIAGNOSIS — E119 Type 2 diabetes mellitus without complications: Secondary | ICD-10-CM | POA: Diagnosis not present

## 2020-01-18 LAB — HM DIABETES EYE EXAM

## 2020-01-19 ENCOUNTER — Encounter: Payer: Self-pay | Admitting: Emergency Medicine

## 2020-01-25 DIAGNOSIS — R69 Illness, unspecified: Secondary | ICD-10-CM | POA: Diagnosis not present

## 2020-03-10 ENCOUNTER — Other Ambulatory Visit: Payer: Self-pay

## 2020-03-10 ENCOUNTER — Ambulatory Visit (INDEPENDENT_AMBULATORY_CARE_PROVIDER_SITE_OTHER): Payer: Medicare HMO | Admitting: Physician Assistant

## 2020-03-10 ENCOUNTER — Encounter: Payer: Self-pay | Admitting: Physician Assistant

## 2020-03-10 VITALS — BP 120/80 | HR 74 | Temp 97.4°F | Resp 14 | Ht 66.0 in | Wt 192.0 lb

## 2020-03-10 DIAGNOSIS — G250 Essential tremor: Secondary | ICD-10-CM | POA: Insufficient documentation

## 2020-03-10 DIAGNOSIS — I152 Hypertension secondary to endocrine disorders: Secondary | ICD-10-CM

## 2020-03-10 DIAGNOSIS — G4700 Insomnia, unspecified: Secondary | ICD-10-CM | POA: Insufficient documentation

## 2020-03-10 DIAGNOSIS — E1159 Type 2 diabetes mellitus with other circulatory complications: Secondary | ICD-10-CM | POA: Insufficient documentation

## 2020-03-10 DIAGNOSIS — E119 Type 2 diabetes mellitus without complications: Secondary | ICD-10-CM | POA: Insufficient documentation

## 2020-03-10 DIAGNOSIS — R972 Elevated prostate specific antigen [PSA]: Secondary | ICD-10-CM | POA: Insufficient documentation

## 2020-03-10 DIAGNOSIS — E7800 Pure hypercholesterolemia, unspecified: Secondary | ICD-10-CM | POA: Insufficient documentation

## 2020-03-10 DIAGNOSIS — E1169 Type 2 diabetes mellitus with other specified complication: Secondary | ICD-10-CM | POA: Diagnosis not present

## 2020-03-10 DIAGNOSIS — E785 Hyperlipidemia, unspecified: Secondary | ICD-10-CM

## 2020-03-10 DIAGNOSIS — N400 Enlarged prostate without lower urinary tract symptoms: Secondary | ICD-10-CM | POA: Diagnosis not present

## 2020-03-10 DIAGNOSIS — I119 Hypertensive heart disease without heart failure: Secondary | ICD-10-CM | POA: Insufficient documentation

## 2020-03-10 DIAGNOSIS — G4709 Other insomnia: Secondary | ICD-10-CM | POA: Insufficient documentation

## 2020-03-10 DIAGNOSIS — I10 Essential (primary) hypertension: Secondary | ICD-10-CM | POA: Insufficient documentation

## 2020-03-10 DIAGNOSIS — G4733 Obstructive sleep apnea (adult) (pediatric): Secondary | ICD-10-CM | POA: Insufficient documentation

## 2020-03-10 NOTE — Patient Instructions (Signed)
I will send a referral to [facility] and they should call you about an appointment within the next few weeks. Continue taking your usual medications.     Benign Prostatic Hyperplasia  Benign prostatic hyperplasia (BPH) is an enlarged prostate gland that is caused by the normal aging process and not by cancer. The prostate is a walnut-sized gland that is involved in the production of semen. It is located in front of the rectum and below the bladder. The bladder stores urine and the urethra is the tube that carries the urine out of the body. The prostate may get bigger as a man gets older. An enlarged prostate can press on the urethra. This can make it harder to pass urine. The build-up of urine in the bladder can cause infection. Back pressure and infection may progress to bladder damage and kidney (renal) failure. What are the causes? This condition is part of a normal aging process. However, not all men develop problems from this condition. If the prostate enlarges away from the urethra, urine flow will not be blocked. If it enlarges toward the urethra and compresses it, there will be problems passing urine. What increases the risk? This condition is more likely to develop in men over the age of 52 years. What are the signs or symptoms? Symptoms of this condition include:  Getting up often during the night to urinate.  Needing to urinate frequently during the day.  Difficulty starting urine flow.  Decrease in size and strength of your urine stream.  Leaking (dribbling) after urinating.  Inability to pass urine. This needs immediate treatment.  Inability to completely empty your bladder.  Pain when you pass urine. This is more common if there is also an infection.  Urinary tract infection (UTI). How is this diagnosed? This condition is diagnosed based on your medical history, a physical exam, and your symptoms. Tests will also be done, such as:  A post-void bladder scan.  This measures any amount of urine that may remain in your bladder after you finish urinating.  A digital rectal exam. In a rectal exam, your health care provider checks your prostate by putting a lubricated, gloved finger into your rectum to feel the back of your prostate gland. This exam detects the size of your gland and any abnormal lumps or growths.  An exam of your urine (urinalysis).  A prostate specific antigen (PSA) screening. This is a blood test used to screen for prostate cancer.  An ultrasound. This test uses sound waves to electronically produce a picture of your prostate gland. Your health care provider may refer you to a specialist in kidney and prostate diseases (urologist). How is this treated? Once symptoms begin, your health care provider will monitor your condition (active surveillance or watchful waiting). Treatment for this condition will depend on the severity of your condition. Treatment may include:  Observation and yearly exams. This may be the only treatment needed if your condition and symptoms are mild.  Medicines to relieve your symptoms, including: ? Medicines to shrink the prostate. ? Medicines to relax the muscle of the prostate.  Surgery in severe cases. Surgery may include: ? Prostatectomy. In this procedure, the prostate tissue is removed completely through an open incision or with a laparoscope or robotics. ? Transurethral resection of the prostate (TURP). In this procedure, a tool is inserted through the opening at the tip of the penis (urethra). It is used to cut away tissue of the inner core of the prostate. The  pieces are removed through the same opening of the penis. This removes the blockage. ? Transurethral incision (TUIP). In this procedure, small cuts are made in the prostate. This lessens the prostate's pressure on the urethra. ? Transurethral microwave thermotherapy (TUMT). This procedure uses microwaves to create heat. The heat destroys and  removes a small amount of prostate tissue. ? Transurethral needle ablation (TUNA). This procedure uses radio frequencies to destroy and remove a small amount of prostate tissue. ? Interstitial laser coagulation ([place]). This procedure uses a laser to destroy and remove a small amount of prostate tissue. ? Transurethral electrovaporization (TUVP). This procedure uses electrodes to destroy and remove a small amount of prostate tissue. ? Prostatic urethral lift. This procedure inserts an implant to push the lobes of the prostate away from the urethra. Follow these instructions at home:  Take over-the-counter and prescription medicines only as told by your health care provider.  Monitor your symptoms for any changes. Contact your health care provider with any changes.  Avoid drinking large amounts of liquid before going to bed or out in public.  Avoid or reduce how much caffeine or alcohol you drink.  Give yourself time when you urinate.  Keep all follow-up visits as told by your health care provider. This is important. Contact a health care provider if:  You have unexplained back pain.  Your symptoms do not get better with treatment.  You develop side effects from the medicine you are taking.  Your urine becomes very dark or has a bad smell.  Your lower abdomen becomes distended and you have trouble passing your urine. Get help right away if:  You have a fever or chills.  You suddenly cannot urinate.  You feel lightheaded, or very dizzy, or you faint.  There are large amounts of blood or clots in the urine.  Your urinary problems become hard to manage.  You develop moderate to severe low back or flank pain. The flank is the side of your body between the ribs and the hip. These symptoms may represent a serious problem that is an emergency. Do not wait to see if the symptoms will go away. Get medical help right away. Call your local emergency services (911 in the [place] Do not  drive yourself to the hospital. Summary  Benign prostatic hyperplasia (BPH) is an enlarged prostate that is caused by the normal aging process and not by cancer.  An enlarged prostate can press on the urethra. This can make it hard to pass urine.  This condition is part of a normal aging process and is more likely to develop in men over the age of 39 years.  Get help right away if you suddenly cannot urinate. This information is not intended to replace advice given to you by your health care provider. Make sure you discuss any questions you have with your health care provider. Document Revised: 09/30/2019 Document Reviewed: 09/30/2019 Elsevier Patient Education  [place].

## 2020-03-10 NOTE — Progress Notes (Signed)
Subjective:    Patient ID: Danny Hunter, male    DOB: 10-Oct-1940, 80 y.o.   MRN: [id number]  HPI 80 yo patient presents in office today for transfer of care.  His main concern is his ongoing issues with BPH.  He would like to get a referral for a new urologist.  He was concerned because his last labs showed a PSA of 6.9 but on further review with me today he realized that was actually his A1c number and not his PSA, which was 2.4.  He does not have any new symptoms of dysuria, hematuria, or pain.  He is getting up to urinate about 3-4 times every night which has not been uncommon for him.  He is also diabetic but diet controlled mostly and does take Metformin.  He states he does Zumba 3-4 times a week and really enjoys it and has gotten a lot of weight off this way.  His blood pressure has been well controlled.  He states overall he feels like he is doing pretty good for his age and feels like he is 48 instead of 32.  His wife is currently undergoing diagnosis for possible Parkinson's disease and he is mainly concerned about her.   Review of Systems  Constitutional: Negative for activity change, appetite change, fever and unexpected weight change.  Cardiovascular: Negative for chest pain and palpitations.  Gastrointestinal: Negative for abdominal pain and nausea.  Endocrine: Negative for polydipsia and polyphagia.  Genitourinary: Positive for decreased urine volume, difficulty urinating, frequency and urgency. Negative for dysuria, flank pain, hematuria, penile discharge, penile pain, penile swelling, scrotal swelling and testicular pain.  Musculoskeletal: Negative for back pain.  Skin: Negative for rash.  Neurological: Positive for tremors (slight tremor in hands, has seen neurologist). Negative for dizziness.  Psychiatric/Behavioral: Negative for confusion.       Objective:   Physical Exam Vitals and nursing note reviewed.  Constitutional:      Appearance: Normal appearance.   HENT:     Head: Normocephalic.  Eyes:     Extraocular Movements: Extraocular movements intact.     Pupils: Pupils are equal, round, and reactive to light.  Cardiovascular:     Rate and Rhythm: Normal rate and regular rhythm.     Pulses: Normal pulses.     Heart sounds: Normal heart sounds.  Pulmonary:     Effort: Pulmonary effort is normal.  Abdominal:     General: Abdomen is flat.     Tenderness: There is no right CVA tenderness or left CVA tenderness.  Neurological:     General: No focal deficit present.     Mental Status: He is alert and oriented to person, place, and time.  Psychiatric:        Mood and Affect: Mood normal.        Behavior: Behavior normal.     Today's Vitals   03/10/20 0856  BP: 120/80  Pulse: 74  Resp: 14  Temp: (!) 97.4 F (36.3 C)  TempSrc: Temporal  SpO2: 96%  Weight: 192 lb (87.1 kg)  Height: 5\' 6"  (1.676 m)   Body mass index is 30.99 kg/m.    Recent Results (from the past 2160 hour(s))  Comprehensive metabolic panel     Status: Abnormal   Collection Time: 12/14/19  9:57 AM  Result Value Ref Range   Sodium 136 135 - 145 mEq/L   Potassium 5.1 3.5 - 5.1 mEq/L   Chloride 100 96 - 112 mEq/L   CO2  29 19 - 32 mEq/L   Glucose, Bld 139 (H) 70 - 99 mg/dL   BUN 21 6 - 23 mg/dL   Creatinine, Ser 1.04 0.40 - 1.50 mg/dL   Total Bilirubin 0.9 0.2 - 1.2 mg/dL   Alkaline Phosphatase 53 39 - 117 U/L   AST 16 0 - 37 U/L   ALT 15 0 - 53 U/L   Total Protein 6.6 6.0 - 8.3 g/dL   Albumin 4.5 3.5 - 5.2 g/dL   GFR 67.77 >60.00 mL/min    Comment: Calculated using the CKD-EPI Creatinine Equation (2021)   Calcium 10.4 8.4 - 10.5 mg/dL  Hemoglobin A1c     Status: Abnormal   Collection Time: 12/14/19  9:57 AM  Result Value Ref Range   Hgb A1c MFr Bld 6.9 (H) 4.6 - 6.5 %    Comment: Glycemic Control Guidelines for People with Diabetes:Non Diabetic:  <6%Goal of Therapy: <7%Additional Action Suggested:  >8%   Lipid panel     Status: None   Collection Time:  12/14/19  9:57 AM  Result Value Ref Range   Cholesterol 131 0 - 200 mg/dL    Comment: ATP III Classification       Desirable:  < 200 mg/dL               Borderline High:  200 - 239 mg/dL          High:  > = 240 mg/dL   Triglycerides 131.0 0.0 - 149.0 mg/dL    Comment: Normal:  <150 mg/dLBorderline High:  150 - 199 mg/dL   HDL 44.00 >39.00 mg/dL   VLDL 26.2 0.0 - 40.0 mg/dL   LDL Cholesterol 61 0 - 99 mg/dL   Total CHOL/HDL Ratio 3     Comment:                Men          Women1/2 Average Risk     3.4          3.3Average Risk          5.0          4.42X Average Risk          9.6          7.13X Average Risk          15.0          11.0                       NonHDL 86.93     Comment: NOTE:  Non-HDL goal should be 30 mg/dL higher than patient's LDL goal (i.e. LDL goal of < 70 mg/dL, would have non-HDL goal of < 100 mg/dL)  PSA, Medicare     Status: None   Collection Time: 12/14/19  9:57 AM  Result Value Ref Range   PSA 2.43 0.10 - 4.00 ng/ml    Comment: Test performed using Access Hybritech PSA Assay, a parmagnetic partical, chemiluminecent immunoassay.  HM DIABETES EYE EXAM     Status: None   Collection Time: 01/18/20 12:00 AM  Result Value Ref Range   HM Diabetic Eye Exam No Retinopathy No Retinopathy         Assessment & Plan:   1. Benign prostatic hyperplasia, unspecified whether lower urinary tract symptoms present No new changes. Referral to Urology for continued care per patient request. Currently taking Proscar 5 mg daily.  2. Controlled type 2 diabetes mellitus without complication,  without long-term current use of insulin ([facility]) Stable. Metformin 1000 mg BID. He is mindful of diet and exercise.  3. Hypertension associated with diabetes ([place]) Stable. Losartan 100 mg qd, Spironolactone 25 mg qd, Amlodipine 5 mg qd. Monitors at home. Tries to limit salt.  4. Hyperlipidemia associated with type 2 diabetes mellitus ([place]) Stable. Atorvastatin 40 mg qd.    This visit occurred  during the SARS-CoV-2 public health emergency.  Safety protocols were in place, including screening questions prior to the visit, additional usage of staff PPE, and extensive cleaning of exam room while observing appropriate contact time as indicated for disinfecting solutions.

## 2020-03-16 ENCOUNTER — Telehealth: Payer: Self-pay

## 2020-03-16 MED ORDER — AMLODIPINE BESYLATE 5 MG PO TABS: 5.0000 mg | ORAL_TABLET | Freq: Every day | ORAL | 1 refills | Status: DC

## 2020-03-16 MED ORDER — SPIRONOLACTONE 25 MG PO TABS
25.0000 mg | ORAL_TABLET | Freq: Every day | ORAL | 1 refills | Status: DC
Start: 2020-03-16 — End: 2020-08-31

## 2020-03-16 MED ORDER — FINASTERIDE 5 MG PO TABS: 5.0000 mg | ORAL_TABLET | Freq: Every day | ORAL | 1 refills | Status: DC

## 2020-03-16 MED ORDER — METFORMIN HCL 500 MG PO TABS
1000.0000 mg | ORAL_TABLET | Freq: Two times a day (BID) | ORAL | 1 refills | Status: DC
Start: 2020-03-16 — End: 2020-10-02

## 2020-03-16 NOTE — Telephone Encounter (Signed)
Spoke to pt told him Rx's were sent to pharmacy as requested. Pt verbalized understanding. 

## 2020-03-16 NOTE — Telephone Encounter (Signed)
Please see message and advise if okay to refill medications? 

## 2020-03-16 NOTE — Telephone Encounter (Signed)
..   LAST APPOINTMENT DATE: 03/10/2020   NEXT APPOINTMENT DATE:@8 /05/2020  MEDICATION:  Metformin, spironolactone, amlodipine, finasteride  PHARMACY: [facility]   Let patient know to contact pharmacy at the end of the day to make sure medication is ready.  Please notify patient to allow 48-72 hours to process  Encourage patient to contact the pharmacy for refills or they can request refills through [place]:   LAST REFILL:  QTY:  REFILL DATE:    OTHER COMMENTS: Patient is currently out of meds    Okay for refill?  Please advise

## 2020-03-16 NOTE — Telephone Encounter (Signed)
Yes, ok to refill for 6 months, thanks!

## 2020-04-05 ENCOUNTER — Encounter: Payer: Medicare HMO | Admitting: Physician Assistant

## 2020-04-07 DIAGNOSIS — R3912 Poor urinary stream: Secondary | ICD-10-CM | POA: Diagnosis not present

## 2020-04-07 DIAGNOSIS — R350 Frequency of micturition: Secondary | ICD-10-CM | POA: Diagnosis not present

## 2020-04-07 DIAGNOSIS — N401 Enlarged prostate with lower urinary tract symptoms: Secondary | ICD-10-CM | POA: Diagnosis not present

## 2020-04-12 DIAGNOSIS — L814 Other melanin hyperpigmentation: Secondary | ICD-10-CM | POA: Diagnosis not present

## 2020-04-12 DIAGNOSIS — D225 Melanocytic nevi of trunk: Secondary | ICD-10-CM | POA: Diagnosis not present

## 2020-04-12 DIAGNOSIS — L821 Other seborrheic keratosis: Secondary | ICD-10-CM | POA: Diagnosis not present

## 2020-04-12 DIAGNOSIS — Z85828 Personal history of other malignant neoplasm of skin: Secondary | ICD-10-CM | POA: Diagnosis not present

## 2020-04-12 DIAGNOSIS — L570 Actinic keratosis: Secondary | ICD-10-CM | POA: Diagnosis not present

## 2020-04-25 ENCOUNTER — Ambulatory Visit: Payer: Medicare HMO | Admitting: Urology

## 2020-04-28 ENCOUNTER — Other Ambulatory Visit: Payer: Self-pay

## 2020-04-28 ENCOUNTER — Encounter: Payer: Self-pay | Admitting: Physician Assistant

## 2020-04-28 ENCOUNTER — Ambulatory Visit (INDEPENDENT_AMBULATORY_CARE_PROVIDER_SITE_OTHER): Payer: Medicare HMO

## 2020-04-28 ENCOUNTER — Ambulatory Visit (INDEPENDENT_AMBULATORY_CARE_PROVIDER_SITE_OTHER): Payer: Medicare HMO | Admitting: Physician Assistant

## 2020-04-28 VITALS — BP 136/72 | HR 99 | Temp 98.2°F | Ht 66.0 in | Wt 191.2 lb

## 2020-04-28 DIAGNOSIS — M25562 Pain in left knee: Secondary | ICD-10-CM

## 2020-04-28 DIAGNOSIS — M25552 Pain in left hip: Secondary | ICD-10-CM

## 2020-04-28 NOTE — Patient Instructions (Signed)
I think this may be arthritis in your hip and knee. I will call with official radiology report and we can plan from there.

## 2020-04-28 NOTE — Progress Notes (Signed)
Acute Office Visit  Subjective:    Patient ID: Danny Hunter, male    DOB: 10/28/1940, 80 y.o.   MRN: [id number]  Chief Complaint  Patient presents with  . Groin Pain    HPI Patient is in today for left inguinal pain x 3-4 months. Most painful with walking. No pain with sitting. External and internal hip rotation seems to aggravate it as well. Has not tried any treatments for it yet. No known injury.   Also having left knee pain for the last month or so. States it is not as bothersome as his hip, but would like this checked as well. NKI. He does work out most days and enjoys Zumba class.   Past Medical History:  Diagnosis Date  . Anxiety   . Diabetes mellitus   . Enlarged heart   . Hyperlipemia   . Hypertension   . Sleep apnea    on c-pap  . Tubular adenoma of colon    2011    Past Surgical History:  Procedure Laterality Date  . CATARACT EXTRACTION  2019  . 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 Cancer Mother   . Heart disease Father   . Cancer Son     Social History   Socioeconomic History  . Marital status: Married    Spouse name: Not on file  . Number of children: Not on file  . Years of education: Not on file  . Highest education level: Not on file  Occupational History  . Occupation: Retired  Tobacco Use  . Smoking status: Never Smoker  . Smokeless tobacco: Never Used  Vaping Use  . Vaping Use: Never used  Substance and Sexual Activity  . Alcohol use: Yes    Alcohol/week: 0.0 standard drinks    Comment: 1 drink a month  . Drug use: No  . Sexual activity: Not Currently  Other Topics Concern  . Not on file  Social History Narrative   Left handed   Social Determinants of Health   Financial Resource Strain: Not on  file  Food Insecurity: Not on file  Transportation Needs: Not on file  Physical Activity: Not on file  Stress: Not on file  Social Connections: Not on file  Intimate Partner Violence: Not on file    Outpatient Medications Prior to Visit  Medication Sig Dispense Refill  . alfuzosin (UROXATRAL) 10 MG 24 hr tablet Take 10 mg by mouth daily.    [place] amLODipine (NORVASC) 5 MG tablet Take 1 tablet (5 mg total) by mouth daily. 90 tablet 1  . atorvastatin (LIPITOR) 40 MG tablet Take 1 tablet (40 mg total) by mouth daily. 90 tablet 1  . finasteride (PROSCAR) 5 MG tablet Take 1 tablet (5 mg total) by mouth daily. 90 tablet 1  . losartan (COZAAR) 100 MG tablet Take 1 tablet (100 mg total) by mouth daily. 90 tablet 1  . metFORMIN (GLUCOPHAGE) 500 MG tablet Take 2 tablets (1,000 mg total) by mouth 2 (two) times daily with a meal. 360 tablet 1  . spironolactone (ALDACTONE) 25 MG tablet Take 1 tablet (25 mg total) by mouth daily. 90 tablet 1   No facility-administered medications prior to visit.    Allergies  Allergen Reactions  . Penicillin G Other (See  Comments)    Review of Systems + Left hip / groin and knee pain; all others negative     Objective:    Physical Exam Vitals and nursing note reviewed. Exam conducted with a chaperone present.  Constitutional:      Appearance: Normal appearance.  Abdominal:     Hernia: There is no hernia in the left inguinal area or right inguinal area.  Genitourinary:    Penis: Normal.      Testes: Normal.  Musculoskeletal:     Comments: Left hip: Non tender to palpation. He has good ROM but complains of most pain with flexion and internal rotation.  Left Knee: Non tender to palpation. Some pain with flexion, but full ROM present.  N/V intact.   Lymphadenopathy:     Lower Body: No right inguinal adenopathy. No left inguinal adenopathy.  Neurological:     Mental Status: He is alert.     BP 136/72   Pulse 99   Temp 98.2 F (36.8 C)   Ht 5\' 6"   (1.676 m)   Wt 191 lb 3.2 oz (86.7 kg)   SpO2 97%   BMI 30.86 kg/m  Wt Readings from Last 3 Encounters:  04/28/20 191 lb 3.2 oz (86.7 kg)  03/10/20 192 lb (87.1 kg)  01/07/20 192 lb 9.6 oz (87.4 kg)    There are no preventive care reminders to display for this patient.  There are no preventive care reminders to display for this patient.   No results found for: TSH Lab Results  Component Value Date   WBC 8.1 12/01/2019   HGB 15.1 12/01/2019   HCT 47.6 12/01/2019   MCV 96.7 12/01/2019   PLT 214 12/01/2019   Lab Results  Component Value Date   NA 136 12/14/2019   K 5.1 12/14/2019   CO2 29 12/14/2019   GLUCOSE 139 (H) 12/14/2019   BUN 21 12/14/2019   CREATININE 1.04 12/14/2019   BILITOT 0.9 12/14/2019   ALKPHOS 53 12/14/2019   AST 16 12/14/2019   ALT 15 12/14/2019   PROT 6.6 12/14/2019   ALBUMIN 4.5 12/14/2019   CALCIUM 10.4 12/14/2019   ANIONGAP 7 12/01/2019   GFR 67.77 12/14/2019   Lab Results  Component Value Date   CHOL 131 12/14/2019   Lab Results  Component Value Date   HDL 44.00 12/14/2019   Lab Results  Component Value Date   LDLCALC 61 12/14/2019   Lab Results  Component Value Date   TRIG 131.0 12/14/2019   Lab Results  Component Value Date   CHOLHDL 3 12/14/2019   Lab Results  Component Value Date   HGBA1C 6.9 (H) 12/14/2019       Assessment & Plan:   Problem List Items Addressed This Visit   None   Visit Diagnoses    Left hip pain    -  Primary   Relevant Orders   DG Hip Unilat W OR W/O Pelvis 2-3 Views Left   Left knee pain, unspecified chronicity       Relevant Orders   DG Knee Complete 4 Views Left      1. Left hip pain 2. Left knee pain, unspecified chronicity Concerned about probable arthritis changes causing his pain. Reassured him that I do not think he has an inguinal hernia. Will have XR's done in office today and call with official radiology report. Will discuss plan from there.   This visit occurred during the  SARS-CoV-2 public health emergency.  Safety protocols were in  place, including screening questions prior to the visit, additional usage of staff PPE, and extensive cleaning of exam room while observing appropriate contact time as indicated for disinfecting solutions.    Prateek Knipple M Santhosh Gulino, PA-C

## 2020-05-18 DIAGNOSIS — R351 Nocturia: Secondary | ICD-10-CM | POA: Diagnosis not present

## 2020-05-18 DIAGNOSIS — N401 Enlarged prostate with lower urinary tract symptoms: Secondary | ICD-10-CM | POA: Diagnosis not present

## 2020-05-18 DIAGNOSIS — R350 Frequency of micturition: Secondary | ICD-10-CM | POA: Diagnosis not present

## 2020-06-15 DIAGNOSIS — N401 Enlarged prostate with lower urinary tract symptoms: Secondary | ICD-10-CM | POA: Diagnosis not present

## 2020-06-15 DIAGNOSIS — R351 Nocturia: Secondary | ICD-10-CM | POA: Diagnosis not present

## 2020-07-07 DIAGNOSIS — R229 Localized swelling, mass and lump, unspecified: Secondary | ICD-10-CM | POA: Insufficient documentation

## 2020-07-20 ENCOUNTER — Telehealth: Payer: Self-pay

## 2020-07-20 ENCOUNTER — Other Ambulatory Visit: Payer: Self-pay

## 2020-07-20 MED ORDER — AMLODIPINE BESYLATE 5 MG PO TABS
5.0000 mg | ORAL_TABLET | Freq: Every day | ORAL | 1 refills | Status: DC
Start: 2020-07-20 — End: 2020-10-31

## 2020-07-20 NOTE — Telephone Encounter (Signed)
Rx sent in

## 2020-07-20 NOTE — Telephone Encounter (Signed)
  LAST APPOINTMENT DATE: 04/28/2020   NEXT APPOINTMENT DATE:@8 /05/2020  MEDICATION:amLODipine (NORVASC) 5 MG tablet  PHARMACY: [facility] # [place], [place] - [place]  Please advise

## 2020-08-17 DIAGNOSIS — C44219 Basal cell carcinoma of skin of left ear and external auricular canal: Secondary | ICD-10-CM | POA: Diagnosis not present

## 2020-08-24 DIAGNOSIS — N401 Enlarged prostate with lower urinary tract symptoms: Secondary | ICD-10-CM | POA: Diagnosis not present

## 2020-08-24 DIAGNOSIS — R351 Nocturia: Secondary | ICD-10-CM | POA: Diagnosis not present

## 2020-08-24 DIAGNOSIS — N3281 Overactive bladder: Secondary | ICD-10-CM | POA: Diagnosis not present

## 2020-08-31 ENCOUNTER — Other Ambulatory Visit: Payer: Self-pay

## 2020-08-31 ENCOUNTER — Telehealth: Payer: Self-pay

## 2020-08-31 MED ORDER — SPIRONOLACTONE 25 MG PO TABS
25.0000 mg | ORAL_TABLET | Freq: Every day | ORAL | 1 refills | Status: DC
Start: 2020-08-31 — End: 2021-06-27

## 2020-08-31 NOTE — Telephone Encounter (Signed)
MEDICATION: spironolactone (ALDACTONE) 25 MG tablet  PHARMACY:  [id number] - [place], [place] Phone:  [phone]  Fax:  [phone]      Comments:   **Let patient know to contact pharmacy at the end of the day to make sure medication is ready. **  ** Please notify patient to allow 48-72 hours to process**  **Encourage patient to contact the pharmacy for refills or they can request refills through [facility]**

## 2020-08-31 NOTE — Telephone Encounter (Signed)
Rx sent in

## 2020-09-07 ENCOUNTER — Ambulatory Visit: Payer: Medicare HMO | Admitting: Physician Assistant

## 2020-09-14 ENCOUNTER — Other Ambulatory Visit: Payer: Self-pay

## 2020-09-14 ENCOUNTER — Ambulatory Visit (INDEPENDENT_AMBULATORY_CARE_PROVIDER_SITE_OTHER): Payer: Medicare HMO

## 2020-09-14 DIAGNOSIS — Z0000 Encounter for general adult medical examination without abnormal findings: Secondary | ICD-10-CM | POA: Diagnosis not present

## 2020-09-14 NOTE — Progress Notes (Signed)
Virtual Visit via Telephone Note  I connected with  Danny Hunter on 09/14/20 at  1:45 PM EDT by telephone and verified that I am speaking with the correct person using two identifiers.  Medicare Annual Wellness visit completed telephonically due to Covid-19 pandemic.   Persons participating in this call: This Health Coach and this patient.   Location: Patient: Home Provider: Office   I discussed the limitations, risks, security and privacy concerns of performing an evaluation and management service by telephone and the availability of in person appointments. The patient expressed understanding and agreed to proceed.  Unable to perform video visit due to video visit attempted and failed and/or patient does not have video capability.   Some vital signs may be absent or patient reported.   Willette Brace, LPN   Subjective:   Danny Hunter is a 80 y.o. male who presents for an Initial Medicare Annual Wellness Visit.  Review of Systems     Cardiac Risk Factors include: advanced age (>28mn, >>36women);hypertension;diabetes mellitus;dyslipidemia;male gender;obesity (BMI >30kg/m2)     Objective:    There were no vitals filed for this visit. There is no height or weight on file to calculate BMI.  Advanced Directives 09/14/2020 01/07/2020 12/01/2019 10/22/2011 10/17/2011  Does Patient Have a Medical Advance Directive? Yes Yes No - Patient has advance directive, copy not in chart  Type of Advance Directive Healthcare Power of AOranLiving will - - Living will;Healthcare Power of ALorainein Chart? No - copy requested - - - Copy requested from family  Pre-existing out of facility DNR order (yellow form or pink MOST form) - - - No -    Current Medications (verified) Outpatient Encounter Medications as of 09/14/2020  Medication Sig   alfuzosin (UROXATRAL) 10 MG 24 hr tablet Take 10 mg by mouth daily.    amLODipine (NORVASC) 5 MG tablet Take 1 tablet (5 mg total) by mouth daily.   atorvastatin (LIPITOR) 40 MG tablet Take 1 tablet (40 mg total) by mouth daily.   finasteride (PROSCAR) 5 MG tablet Take 1 tablet (5 mg total) by mouth daily.   metFORMIN (GLUCOPHAGE) 500 MG tablet Take 2 tablets (1,000 mg total) by mouth 2 (two) times daily with a meal.   spironolactone (ALDACTONE) 25 MG tablet Take 1 tablet (25 mg total) by mouth daily.   losartan (COZAAR) 100 MG tablet Take 1 tablet (100 mg total) by mouth daily. (Patient not taking: Reported on 09/14/2020)   No facility-administered encounter medications on file as of 09/14/2020.    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   Procedure: TOTAL SHOULDER ARTHROPLASTY;  Surgeon: JNita Sells MD;  Location: [facility]  Service: Orthopedics;  Laterality: Right;  right total shoulder arthroplasty   Family History  Problem Relation Age of Onset   Cancer Mother    Heart disease Father    Cancer Son    Social History   Socioeconomic History   Marital status: Married    Spouse name: Not on file  Number of children: Not on file   Years of education: Not on file   Highest education level: Not on file  Occupational History   Occupation: Retired  Tobacco Use   Smoking status: Never   Smokeless tobacco: Never  Vaping Use   Vaping Use: Never used  Substance and Sexual Activity   Alcohol use: Yes    Comment: 1 drink a month   Drug use: No   Sexual activity: Not Currently  Other Topics Concern   Not on file  Social History Narrative   Left handed   Social Determinants of Health   Financial Resource Strain: Low Risk     Difficulty of Paying Living Expenses: Not hard at all  Food Insecurity: No Food Insecurity   Worried About Charity fundraiser in the Last Year: Never true   [place] in the Last Year: Never true  Transportation Needs: No Transportation Needs   Lack of Transportation (Medical): No   Lack of Transportation (Non-Medical): No  Physical Activity: Sufficiently Active   Days of Exercise per Week: 4 days   Minutes of Exercise per Session: 60 min  Stress: No Stress Concern Present   Feeling of Stress : Not at all  Social Connections: Moderately Integrated   Frequency of Communication with Friends and Family: More than three times a week   Frequency of Social Gatherings with Friends and Family: More than three times a week   Attends Religious Services: Never   Marine scientist or Organizations: Yes   Attends Music therapist: 1 to 4 times per year   Marital Status: Married    Tobacco Counseling Counseling given: Not Answered   Clinical Intake:  Pre-visit preparation completed: Yes  Pain : No/denies pain     BMI - recorded: 31.6 Nutritional Status: BMI > 30  Obese Nutritional Risks: None Diabetes: Yes CBG done?: No CBG resulted in Enter/ Edit results?: No Did pt. bring in CBG monitor from home?: No  How often do you need to have someone help you when you read instructions, pamphlets, or other written materials from your doctor or pharmacy?: 1 - Never  Diabetic?Nutrition Risk Assessment:  Has the patient had any N/V/D within the last 2 months?  No  Does the patient have any non-healing wounds?  No  Has the patient had any unintentional weight loss or weight gain?  No   Diabetes:  Is the patient diabetic?  Yes  If diabetic, was a CBG obtained today?  No  Did the patient bring in their glucometer from home?  No  How often do you monitor your CBG's? N/a.   Financial Strains and Diabetes Management:  Are you having any financial strains with the  device, your supplies or your medication? No .  Does the patient want to be seen by Chronic Care Management for management of their diabetes?  No  Would the patient like to be referred to a Nutritionist or for Diabetic Management?  No   Diabetic Exams:  Diabetic Eye Exam: Completed 01/18/20 Diabetic Foot Exam: Completed 10/19/19   Interpreter Needed?: No  Information entered by :: Charlott Rakes, LPN   Activities of Daily Living In your present state of health, do you have any difficulty performing the following activities: 09/14/2020 10/19/2019  Hearing? Y N  Comment mild loss -  Vision? N N  Difficulty concentrating or making decisions? N N  Walking or climbing stairs? N N  Dressing or bathing? N  N  Doing errands, shopping? N N  Preparing Food and eating ? N -  Using the Toilet? N -  In the past six months, have you accidently leaked urine? N -  Do you have problems with loss of bowel control? N -  Managing your Medications? N -  Managing your Finances? N -  Housekeeping or managing your Housekeeping? N -  Some recent data might be hidden    Patient Care Team: Allwardt, Randa Evens, PA-C as PCP - General (Physician Assistant) Freada Bergeron, MD as PCP - Cardiology (Cardiology) Opthamology, [facility] (Ophthalmology)  Indicate any recent Medical Services you may have received from other than [facility] providers in the past year (date may be approximate).     Assessment:   This is a routine wellness examination for [facility].  Hearing/Vision screen Hearing Screening - Comments:: Pt states mild loss Vision Screening - Comments:: Pt follows up with Dr Satira Sark for annual eye exams   Dietary issues and exercise activities discussed: Current Exercise Habits: Home exercise routine, Type of exercise: Other - see comments, Time (Minutes): 60, Frequency (Times/Week): 4, Weekly Exercise (Minutes/Week): 240   Goals Addressed             This Visit's Progress    Patient Stated        Continue exercise        Depression Screen PHQ 2/9 Scores 09/14/2020 10/19/2019  PHQ - 2 Score 0 0  PHQ- 9 Score - 0    Fall Risk Fall Risk  09/14/2020 01/07/2020 10/19/2019  Falls in the past year? 0 0 0  Number falls in past yr: 0 0 0  Injury with Fall? 0 0 0  Follow up Falls prevention discussed - Falls evaluation completed    FALL RISK PREVENTION PERTAINING TO THE HOME:  Any stairs in or around the home? Yes  If so, are there any without handrails? No  Home free of loose throw rugs in walkways, pet beds, electrical cords, etc? Yes  Adequate lighting in your home to reduce risk of falls? Yes   ASSISTIVE DEVICES UTILIZED TO PREVENT FALLS:  Life alert? No  Use of a cane, walker or w/c? No  Grab bars in the bathroom? Yes  Shower chair or bench in shower? No  Elevated toilet seat or a handicapped toilet? No   TIMED UP AND GO:  Was the test performed? No .   Cognitive Function:     6CIT Screen 09/14/2020  What Year? 0 points  What month? 0 points  What time? 0 points  Count back from 20 0 points  Months in reverse 0 points  Repeat phrase 0 points  Total Score 0    Immunizations Immunization History  Administered Date(s) Administered   Influenza, High Dose Seasonal PF 10/22/2013, 01/03/2015, 10/14/2019   Influenza, Quadrivalent, Recombinant, Inj, Pf 12/02/2017, 10/09/2018, 11/11/2019   Moderna Sars-Covid-2 Vaccination 02/18/2019, 03/18/2019, 12/23/2019   Pneumococcal Conjugate-13 03/22/2014, 12/14/2019   Pneumococcal Polysaccharide-23 07/28/2017    TDAP status: Due, Education has been provided regarding the importance of this vaccine. Advised may receive this vaccine at local pharmacy or Health Dept. Aware to provide a copy of the vaccination record if obtained from local pharmacy or Health Dept. Verbalized acceptance and understanding.  Flu Vaccine status: Due, Education has been provided regarding the importance of this vaccine. Advised may receive this  vaccine at local pharmacy or Health Dept. Aware to provide a copy of the vaccination record if obtained from local pharmacy  or Health Dept. Verbalized acceptance and understanding.  Pneumococcal vaccine status: Up to date  Covid-19 vaccine status: Completed vaccines  Qualifies for Shingles Vaccine? Yes   Zostavax completed No   Shingrix Completed?: No.    Education has been provided regarding the importance of this vaccine. Patient has been advised to call insurance company to determine out of pocket expense if they have not yet received this vaccine. Advised may also receive vaccine at local pharmacy or Health Dept. Verbalized acceptance and understanding.  Screening Tests Health Maintenance  Topic Date Due   Zoster Vaccines- Shingrix (1 of 2) Never done   COVID-19 Vaccine (4 - Booster for Moderna series) 04/21/2020   HEMOGLOBIN A1C  06/12/2020   INFLUENZA VACCINE  09/04/2020   TETANUS/TDAP  12/13/2020 (Originally 05/11/1959)   FOOT EXAM  10/18/2020   OPHTHALMOLOGY EXAM  01/17/2021   PNA vac Low Risk Adult  Completed   HPV VACCINES  Aged Out    Health Maintenance  Health Maintenance Due  Topic Date Due   Zoster Vaccines- Shingrix (1 of 2) Never done   COVID-19 Vaccine (4 - Booster for Moderna series) 04/21/2020   HEMOGLOBIN A1C  06/12/2020   INFLUENZA VACCINE  09/04/2020    Colorectal cancer screening: No longer required.     Additional Screening:    Vision Screening: Recommended annual ophthalmology exams for early detection of glaucoma and other disorders of the eye. Is the patient up to date with their annual eye exam?  Yes  Who is the provider or what is the name of the office in which the patient attends annual eye exams? Dr Satira Sark If pt is not established with a provider, would they like to be referred to a provider to establish care? No .   Dental Screening: Recommended annual dental exams for proper oral hygiene  Community Resource Referral / Chronic Care  Management: CRR required this visit?  No   CCM required this visit?  No      Plan:     I have personally reviewed and noted the following in the patient's chart:   Medical and social history Use of alcohol, tobacco or illicit drugs  Current medications and supplements including opioid prescriptions. Patient is not currently taking opioid prescriptions. Functional ability and status Nutritional status Physical activity Advanced directives List of other physicians Hospitalizations, surgeries, and ER visits in previous 12 months Vitals Screenings to include cognitive, depression, and falls Referrals and appointments  In addition, I have reviewed and discussed with patient certain preventive protocols, quality metrics, and best practice recommendations. A written personalized care plan for preventive services as well as general preventive health recommendations were provided to patient.     Willette Brace, LPN   [id number]   Nurse Notes: None

## 2020-09-14 NOTE — Patient Instructions (Signed)
Danny Hunter , Thank you for taking time to come for your Medicare Wellness Visit. I appreciate your ongoing commitment to your health goals. Please review the following plan we discussed and let me know if I can assist you in the future.   Screening recommendations/referrals: Colonoscopy: no longer reuired  Recommended yearly ophthalmology/optometry visit for glaucoma screening and checkup Recommended yearly dental visit for hygiene and checkup  Vaccinations: Influenza vaccine: Due after 09/04/20 Pneumococcal vaccine: Completed  Tdap vaccine: Due and discussed Shingles vaccine: Shingrix discussed. Please contact your pharmacy for coverage information.    Covid-19: Completed 1/14, 2/11, & 12/23/19  Advanced directives: Please bring a copy of your health care power of attorney and living will to the office at your convenience.  Conditions/risks identified: Continue exercising   Next appointment: Follow up in one year for your annual wellness visit.   Preventive Care 60 Years and Older, Male Preventive care refers to lifestyle choices and visits with your health care provider that can promote health and wellness. What does preventive care include? A yearly physical exam. This is also called an annual well check. Dental exams once or twice a year. Routine eye exams. Ask your health care provider how often you should have your eyes checked. Personal lifestyle choices, including: Daily care of your teeth and gums. Regular physical activity. Eating a healthy diet. Avoiding tobacco and drug use. Limiting alcohol use. Practicing safe sex. Taking low doses of aspirin every day. Taking vitamin and mineral supplements as recommended by your health care provider. What happens during an annual well check? The services and screenings done by your health care provider during your annual well check will depend on your age, overall health, lifestyle risk factors, and family history of  disease. Counseling  Your health care provider may ask you questions about your: Alcohol use. Tobacco use. Drug use. Emotional well-being. Home and relationship well-being. Sexual activity. Eating habits. History of falls. Memory and ability to understand (cognition). Work and work Statistician. Screening  You may have the following tests or measurements: Height, weight, and BMI. Blood pressure. Lipid and cholesterol levels. These may be checked every 5 years, or more frequently if you are over 39 years old. Skin check. Lung cancer screening. You may have this screening every year starting at age 81 if you have a 30-pack-year history of smoking and currently smoke or have quit within the past 15 years. Fecal occult blood test (FOBT) of the stool. You may have this test every year starting at age 37. Flexible sigmoidoscopy or colonoscopy. You may have a sigmoidoscopy every 5 years or a colonoscopy every 10 years starting at age 73. Prostate cancer screening. Recommendations will vary depending on your family history and other risks. Hepatitis C blood test. Hepatitis B blood test. Sexually transmitted disease (STD) testing. Diabetes screening. This is done by checking your blood sugar (glucose) after you have not eaten for a while (fasting). You may have this done every 1-3 years. Abdominal aortic aneurysm (AAA) screening. You may need this if you are a current or former smoker. Osteoporosis. You may be screened starting at age 34 if you are at high risk. Talk with your health care provider about your test results, treatment options, and if necessary, the need for more tests. Vaccines  Your health care provider may recommend certain vaccines, such as: Influenza vaccine. This is recommended every year. Tetanus, diphtheria, and acellular pertussis (Tdap, Td) vaccine. You may need a Td booster every 10 years. Zoster vaccine.  You may need this after age 21. Pneumococcal 13-valent  conjugate (PCV13) vaccine. One dose is recommended after age 8. Pneumococcal polysaccharide (PPSV23) vaccine. One dose is recommended after age 45. Talk to your health care provider about which screenings and vaccines you need and how often you need them. This information is not intended to replace advice given to you by your health care provider. Make sure you discuss any questions you have with your health care provider. Document Released: 02/17/2015 Document Revised: 10/11/2015 Document Reviewed: 11/22/2014 Elsevier Interactive Patient Education  2017 [place] Prevention in the Home Falls can cause injuries. They can happen to people of all ages. There are many things you can do to make your home safe and to help prevent falls. What can I do on the outside of my home? Regularly fix the edges of walkways and driveways and fix any cracks. Remove anything that might make you trip as you walk through a door, such as a raised step or threshold. Trim any bushes or trees on the path to your home. Use bright outdoor lighting. Clear any walking paths of anything that might make someone trip, such as rocks or tools. Regularly check to see if handrails are loose or broken. Make sure that both sides of any steps have handrails. Any raised decks and porches should have guardrails on the edges. Have any leaves, snow, or ice cleared regularly. Use sand or salt on walking paths during winter. Clean up any spills in your garage right away. This includes oil or grease spills. What can I do in the bathroom? Use night lights. Install grab bars by the toilet and in the tub and shower. Do not use towel bars as grab bars. Use non-skid mats or decals in the tub or shower. If you need to sit down in the shower, use a plastic, non-slip stool. Keep the floor dry. Clean up any water that spills on the floor as soon as it happens. Remove soap buildup in the tub or shower regularly. Attach bath mats  securely with double-sided non-slip rug tape. Do not have throw rugs and other things on the floor that can make you trip. What can I do in the bedroom? Use night lights. Make sure that you have a light by your bed that is easy to reach. Do not use any sheets or blankets that are too big for your bed. They should not hang down onto the floor. Have a firm chair that has side arms. You can use this for support while you get dressed. Do not have throw rugs and other things on the floor that can make you trip. What can I do in the kitchen? Clean up any spills right away. Avoid walking on wet floors. Keep items that you use a lot in easy-to-reach places. If you need to reach something above you, use a strong step stool that has a grab bar. Keep electrical cords out of the way. Do not use floor polish or wax that makes floors slippery. If you must use wax, use non-skid floor wax. Do not have throw rugs and other things on the floor that can make you trip. What can I do with my stairs? Do not leave any items on the stairs. Make sure that there are handrails on both sides of the stairs and use them. Fix handrails that are broken or loose. Make sure that handrails are as long as the stairways. Check any carpeting to make sure that it is  firmly attached to the stairs. Fix any carpet that is loose or worn. Avoid having throw rugs at the top or bottom of the stairs. If you do have throw rugs, attach them to the floor with carpet tape. Make sure that you have a light switch at the top of the stairs and the bottom of the stairs. If you do not have them, ask someone to add them for you. What else can I do to help prevent falls? Wear shoes that: Do not have high heels. Have rubber bottoms. Are comfortable and fit you well. Are closed at the toe. Do not wear sandals. If you use a stepladder: Make sure that it is fully opened. Do not climb a closed stepladder. Make sure that both sides of the stepladder  are locked into place. Ask someone to hold it for you, if possible. Clearly mark and make sure that you can see: Any grab bars or handrails. First and last steps. Where the edge of each step is. Use tools that help you move around (mobility aids) if they are needed. These include: Canes. Walkers. Scooters. Crutches. Turn on the lights when you go into a dark area. Replace any light bulbs as soon as they burn out. Set up your furniture so you have a clear path. Avoid moving your furniture around. If any of your floors are uneven, fix them. If there are any pets around you, be aware of where they are. Review your medicines with your doctor. Some medicines can make you feel dizzy. This can increase your chance of falling. Ask your doctor what other things that you can do to help prevent falls. This information is not intended to replace advice given to you by your health care provider. Make sure you discuss any questions you have with your health care provider. Document Released: 11/17/2008 Document Revised: 06/29/2015 Document Reviewed: 02/25/2014 Elsevier Interactive Patient Education  2017 [employer].

## 2020-10-02 ENCOUNTER — Other Ambulatory Visit: Payer: Self-pay | Admitting: Physician Assistant

## 2020-10-02 DIAGNOSIS — R3912 Poor urinary stream: Secondary | ICD-10-CM | POA: Diagnosis not present

## 2020-10-02 DIAGNOSIS — R351 Nocturia: Secondary | ICD-10-CM | POA: Diagnosis not present

## 2020-10-02 DIAGNOSIS — N401 Enlarged prostate with lower urinary tract symptoms: Secondary | ICD-10-CM | POA: Diagnosis not present

## 2020-10-02 MED ORDER — METFORMIN HCL 500 MG PO TABS
1000.0000 mg | ORAL_TABLET | Freq: Two times a day (BID) | ORAL | 1 refills | Status: DC
Start: 2020-10-02 — End: 2021-06-11

## 2020-10-17 ENCOUNTER — Other Ambulatory Visit: Payer: Self-pay

## 2020-10-17 ENCOUNTER — Encounter: Payer: Self-pay | Admitting: Physician Assistant

## 2020-10-17 ENCOUNTER — Ambulatory Visit (INDEPENDENT_AMBULATORY_CARE_PROVIDER_SITE_OTHER): Payer: Medicare HMO | Admitting: Physician Assistant

## 2020-10-17 VITALS — BP 140/82 | HR 64 | Temp 98.0°F | Ht 66.0 in | Wt 189.2 lb

## 2020-10-17 DIAGNOSIS — I152 Hypertension secondary to endocrine disorders: Secondary | ICD-10-CM | POA: Diagnosis not present

## 2020-10-17 DIAGNOSIS — J302 Other seasonal allergic rhinitis: Secondary | ICD-10-CM

## 2020-10-17 DIAGNOSIS — E1169 Type 2 diabetes mellitus with other specified complication: Secondary | ICD-10-CM | POA: Diagnosis not present

## 2020-10-17 DIAGNOSIS — E119 Type 2 diabetes mellitus without complications: Secondary | ICD-10-CM | POA: Diagnosis not present

## 2020-10-17 DIAGNOSIS — E1159 Type 2 diabetes mellitus with other circulatory complications: Secondary | ICD-10-CM

## 2020-10-17 DIAGNOSIS — M25552 Pain in left hip: Secondary | ICD-10-CM | POA: Diagnosis not present

## 2020-10-17 DIAGNOSIS — N400 Enlarged prostate without lower urinary tract symptoms: Secondary | ICD-10-CM

## 2020-10-17 DIAGNOSIS — E785 Hyperlipidemia, unspecified: Secondary | ICD-10-CM | POA: Diagnosis not present

## 2020-10-17 LAB — COMPREHENSIVE METABOLIC PANEL
ALT: 17 U/L (ref 0–53)
AST: 18 U/L (ref 0–37)
Albumin: 4.4 g/dL (ref 3.5–5.2)
Alkaline Phosphatase: 61 U/L (ref 39–117)
BUN: 15 mg/dL (ref 6–23)
CO2: 29 mEq/L (ref 19–32)
Calcium: 10.8 mg/dL — ABNORMAL HIGH (ref 8.4–10.5)
Chloride: 101 mEq/L (ref 96–112)
Creatinine, Ser: 1.06 mg/dL (ref 0.40–1.50)
GFR: 65.85 mL/min (ref >60.00)
Glucose, Bld: 148 mg/dL — ABNORMAL HIGH (ref 70–99)
Potassium: 4.7 mEq/L (ref 3.5–5.1)
Sodium: 137 mEq/L (ref 135–145)
Total Bilirubin: 0.9 mg/dL (ref 0.2–1.2)
Total Protein: 7.1 g/dL (ref 6.0–8.3)

## 2020-10-17 LAB — CBC WITH DIFFERENTIAL/PLATELET
Basophils Absolute: 0 10*3/uL (ref 0.0–0.1)
Basophils Relative: 0.6 % (ref 0.0–3.0)
Eosinophils Absolute: 0 10*3/uL (ref 0.0–0.7)
Eosinophils Relative: 0.6 % (ref 0.0–5.0)
HCT: 47.3 % (ref 39.0–52.0)
Hemoglobin: 15.4 g/dL (ref 13.0–17.0)
Lymphocytes Relative: 11.1 % — ABNORMAL LOW (ref 12.0–46.0)
Lymphs Abs: 0.8 10*3/uL (ref 0.7–4.0)
MCHC: 32.6 g/dL (ref 30.0–36.0)
MCV: 94.9 fl (ref 78.0–100.0)
Monocytes Absolute: 0.6 10*3/uL (ref 0.1–1.0)
Monocytes Relative: 8.5 % (ref 3.0–12.0)
Neutro Abs: 5.9 10*3/uL (ref 1.4–7.7)
Neutrophils Relative %: 79.2 % — ABNORMAL HIGH (ref 43.0–77.0)
Platelets: 243 10*3/uL (ref 150.0–400.0)
RBC: 4.99 Mil/uL (ref 4.22–5.81)
RDW: 13.2 % (ref 11.5–15.5)
WBC: 7.5 10*3/uL (ref 4.0–10.5)

## 2020-10-17 LAB — HEMOGLOBIN A1C: Hgb A1c MFr Bld: 6.8 % — ABNORMAL HIGH (ref 4.6–6.5)

## 2020-10-17 LAB — LIPID PANEL
Cholesterol: 140 mg/dL (ref 0–200)
HDL: 48.3 mg/dL (ref >39.00)
LDL Cholesterol: 71 mg/dL (ref 0–99)
NonHDL: 91.93
Total CHOL/HDL Ratio: 3
Triglycerides: 104 mg/dL (ref 0.0–149.0)
VLDL: 20.8 mg/dL (ref 0.0–40.0)

## 2020-10-17 NOTE — Progress Notes (Signed)
Established Patient Office Visit  Subjective:  Patient ID: Danny Hunter, male    DOB: [birth year]  Age: 80 y.o. MRN: [id number]  CC:  Chief Complaint  Patient presents with   Follow-up   Medication Refill    HPI Danny Hunter presents for f/up, labs, and medication refill. See A/P.  Past Medical History:  Diagnosis Date   Anxiety    Diabetes mellitus    Enlarged heart    Hyperlipemia    Hypertension    Sleep apnea    on c-pap   Tubular adenoma of colon    2011    Past Surgical History:  Procedure Laterality Date   CATARACT EXTRACTION  2019   TOTAL SHOULDER ARTHROPLASTY  10/22/2011   Procedure: TOTAL SHOULDER ARTHROPLASTY;  Surgeon: Nita Sells, MD;  Location: [facility];  Service: Orthopedics;  Laterality: Right;  right total shoulder arthroplasty    Family History  Problem Relation Age of Onset   Cancer Mother    Heart disease Father    Cancer Son     Social History   Socioeconomic History   Marital status: Married    Spouse name: Not on file   Number of children: Not on file   Years of education: Not on file   Highest education level: Not on file  Occupational History   Occupation: Retired  Tobacco Use   Smoking status: Never   Smokeless tobacco: Never  Vaping Use   Vaping Use: Never used  Substance and Sexual Activity   Alcohol use: Yes    Comment: 1 drink a month   Drug use: No   Sexual activity: Not Currently  Other Topics Concern   Not on file  Social History Narrative   Left handed   Social Determinants of Health   Financial Resource Strain: Low Risk    Difficulty of Paying Living Expenses: Not hard at all  Food Insecurity: No Food Insecurity   Worried About Charity fundraiser in the Last Year: Never true   [place] in the Last Year: Never true  Transportation Needs: No Transportation Needs   Lack of Transportation (Medical): No   Lack of Transportation (Non-Medical): No  Physical Activity: Sufficiently Active    Days of Exercise per Week: 4 days   Minutes of Exercise per Session: 60 min  Stress: No Stress Concern Present   Feeling of Stress : Not at all  Social Connections: Moderately Integrated   Frequency of Communication with Friends and Family: More than three times a week   Frequency of Social Gatherings with Friends and Family: More than three times a week   Attends Religious Services: Never   Marine scientist or Organizations: Yes   Attends Music therapist: 1 to 4 times per year   Marital Status: Married  Human resources officer Violence: Not At Risk   Fear of Current or Ex-Partner: No   Emotionally Abused: No   Physically Abused: No   Sexually Abused: No    Outpatient Medications Prior to Visit  Medication Sig Dispense Refill   alfuzosin (UROXATRAL) 10 MG 24 hr tablet Take 10 mg by mouth daily.     amLODipine (NORVASC) 5 MG tablet Take 1 tablet (5 mg total) by mouth daily. 90 tablet 1   atorvastatin (LIPITOR) 40 MG tablet Take 1 tablet (40 mg total) by mouth daily. 90 tablet 1   finasteride (PROSCAR) 5 MG tablet Take 1 tablet (5 mg total) by  mouth daily. 90 tablet 1   losartan (COZAAR) 100 MG tablet Take 1 tablet (100 mg total) by mouth daily. 90 tablet 1   metFORMIN (GLUCOPHAGE) 500 MG tablet Take 2 tablets (1,000 mg total) by mouth 2 (two) times daily with a meal. 360 tablet 1   silodosin (RAPAFLO) 8 MG CAPS capsule Take 8 mg by mouth daily.     spironolactone (ALDACTONE) 25 MG tablet Take 1 tablet (25 mg total) by mouth daily. 90 tablet 1   No facility-administered medications prior to visit.    Allergies  Allergen Reactions   Penicillin G Other (See Comments)    ROS Review of Systems REFER TO HPI FOR PERTINENT POSITIVES AND NEGATIVES    Objective:    Physical Exam Vitals and nursing note reviewed.  Constitutional:      Appearance: Normal appearance.  HENT:     Head: Normocephalic.  Eyes:     Extraocular Movements: Extraocular movements intact.      Pupils: Pupils are equal, round, and reactive to light.  Cardiovascular:     Rate and Rhythm: Normal rate and regular rhythm.     Pulses: Normal pulses.     Heart sounds: Normal heart sounds.  Pulmonary:     Effort: Pulmonary effort is normal.  Abdominal:     General: Abdomen is flat.     Tenderness: There is no right CVA tenderness or left CVA tenderness.  Neurological:     General: No focal deficit present.     Mental Status: He is alert and oriented to person, place, and time.  Psychiatric:        Mood and Affect: Mood normal.        Behavior: Behavior normal.    BP 140/82   Pulse 64   Temp 98 F (36.7 C)   Ht '5\' 6"'$  (1.676 m)   Wt 189 lb 3.2 oz (85.8 kg)   SpO2 97%   BMI 30.54 kg/m  Wt Readings from Last 3 Encounters:  10/17/20 189 lb 3.2 oz (85.8 kg)  04/28/20 191 lb 3.2 oz (86.7 kg)  03/10/20 192 lb (87.1 kg)     Health Maintenance Due  Topic Date Due   Zoster Vaccines- Shingrix (1 of 2) Never done   COVID-19 Vaccine (4 - Booster for Moderna series) 04/21/2020   INFLUENZA VACCINE  09/04/2020    There are no preventive care reminders to display for this patient.  No results found for: TSH Lab Results  Component Value Date   WBC 7.5 10/17/2020   HGB 15.4 10/17/2020   HCT 47.3 10/17/2020   MCV 94.9 10/17/2020   PLT 243.0 10/17/2020   Lab Results  Component Value Date   NA 137 10/17/2020   K 4.7 10/17/2020   CO2 29 10/17/2020   GLUCOSE 148 (H) 10/17/2020   BUN 15 10/17/2020   CREATININE 1.06 10/17/2020   BILITOT 0.9 10/17/2020   ALKPHOS 61 10/17/2020   AST 18 10/17/2020   ALT 17 10/17/2020   PROT 7.1 10/17/2020   ALBUMIN 4.4 10/17/2020   CALCIUM 10.8 (H) 10/17/2020   ANIONGAP 7 12/01/2019   GFR 65.85 10/17/2020   Lab Results  Component Value Date   CHOL 140 10/17/2020   Lab Results  Component Value Date   HDL 48.30 10/17/2020   Lab Results  Component Value Date   LDLCALC 71 10/17/2020   Lab Results  Component Value Date   TRIG  104.0 10/17/2020   Lab Results  Component Value Date  CHOLHDL 3 10/17/2020   Lab Results  Component Value Date   HGBA1C 6.8 (H) 10/17/2020      Assessment & Plan:   Problem List Items Addressed This Visit   None Visit Diagnoses     Benign prostatic hyperplasia, unspecified whether lower urinary tract symptoms present    -  Primary   Relevant Medications   silodosin (RAPAFLO) 8 MG CAPS capsule   Other Relevant Orders   CBC with Differential/Platelet (Completed)   Controlled type 2 diabetes mellitus without complication, without long-term current use of insulin (HCC)       Relevant Orders   CBC with Differential/Platelet (Completed)   Comprehensive metabolic panel (Completed)   Hemoglobin A1c (Completed)   Hypertension associated with diabetes ([place])       Relevant Orders   CBC with Differential/Platelet (Completed)   Lipid panel (Completed)   Hyperlipidemia associated with type 2 diabetes mellitus ([place])       Relevant Orders   CBC with Differential/Platelet (Completed)   Lipid panel (Completed)   Left hip pain       Relevant Orders   Ambulatory referral to Sports Medicine   Seasonal allergies          1. Benign prostatic hyperplasia, unspecified whether lower urinary tract symptoms present -He is following up with [facility]. -He is currently taking Rapaflo, Uroxatrol, and Proscar.  He is not sure he is seeing the best results with these medications and is going to discuss other options at his next visit with them.  2. Controlled type 2 diabetes mellitus without complication, without long-term current use of insulin ([place]) -Need to recheck labs today. -He is still taking metformin 1000 mg twice daily.  3. Hypertension associated with diabetes ([place]) -His blood pressure is elevated today.  He tells me that there was some conflict in the family last night.  He is also staying very stressed recently because of his wife's declining health. -He is currently taking  Norvasc 5 mg and losartan 100 mg and spironolactone 25 mg. -Encouraged him to continue monitoring this closely and we will adjust medications if we need to.  4. Hyperlipidemia associated with type 2 diabetes mellitus ([place]) -Recheck lipid panel today.  He is taking Lipitor 40 mg.  5. Left hip pain -He has degenerative changes in this hip according to his x-ray.  I think he might benefit from an ultrasound-guided steroid injection.  Referral to sports medicine to discuss this and other options. -He likes to stay very active and exercises frequently.  This is good to stress relief for him as well and we want to continue this quality-of-life element for him as long as possible.  6. Seasonal allergies -Encouraged him to try loratadine 10 mg daily and Flonase over-the-counter.   Follow-up: Return in about 6 months (around 04/16/2021) for fasting labs and med check .    Azaliah Carrero M Nalayah Hitt, PA-C

## 2020-10-17 NOTE — Patient Instructions (Addendum)
Good to see you again.  Please go to the lab for blood work and I will send results through [facility]. Referral to sports medicine for help with your left hip.  Try Flonase OTC nasal spray & daily Claritin 10 mg for help with allergies and cough.

## 2020-10-18 NOTE — Progress Notes (Signed)
Subjective:    CC: L hip pain  I, Molly Weber, LAT, ATC, am serving as scribe for Dr. Lynne Leader.  HPI: Pt is an 7/ y/o male presenting w/ c/o L hip pain x 6 months w/ no known MOI.  He locates his pain to his L groin/ant hip. He notes that he cannot have a hip replacement right now because he is the primary caregiver for his wife who is dealing with Parkinson's disease and Alzheimer's dementia.  Radiating pain: No Mechanical symptoms: No Aggravating factors: FABER position; walking Treatments tried: tylenol  Diagnostic testing: L knee and L hip XR- 04/28/20  Pertinent review of Systems: No fevers or chills  Relevant historical information: Diabetes well controlled.  Hypertension.  Hypercholesterolemia.   Objective:    Vitals:   10/19/20 0902  BP: 140/78  Pulse: 77  SpO2: 97%   General: Well Developed, well nourished, and in no acute distress.   MSK: Left hip normal-appearing decreased motion.  Normal gait.  Intact strength.  Lab and Radiology Results  Procedure: Real-time Ultrasound Guided Injection of left hip femoral acetabular joint Device: Philips Affiniti 50G Images permanently stored and available for review in PACS Verbal informed consent obtained.  Discussed risks and benefits of procedure. Warned about infection bleeding damage to structures skin hypopigmentation and fat atrophy among others. Patient expresses understanding and agreement Time-out conducted.   Noted no overlying erythema, induration, or other signs of local infection.   Skin prepped in a sterile fashion.   Local anesthesia: Topical Ethyl chloride.   With sterile technique and under real time ultrasound guidance: 40 mg of Kenalog and 2 mL of lidocaine injected into hip joint. Fluid seen entering the joint capsule.   Completed without difficulty   Pain immediately resolved suggesting accurate placement of the medication.   Advised to call if fevers/chills, erythema, induration,  drainage, or persistent bleeding.   Images permanently stored and available for review in the ultrasound unit.  Impression: Technically successful ultrasound guided injection.   EXAM: DG HIP (WITH OR WITHOUT PELVIS) 2-3V LEFT   COMPARISON:  None   FINDINGS: Degenerative changes in the left hip with mild loss of joint space relative to the right. No other abnormalities.   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 No other abnormalities.     Electronically Signed   By: Dorise Bullion III M.D   On: 04/30/2020 12:05   I, Lynne Leader, personally (independently) visualized and performed the interpretation of the images attached in this note.      Impression and Recommendations:    Assessment and Plan: 80 y.o. 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  Patient is not a great candidate right now for total hip replacement primarily because he is the primary caregiver for his wife who relies on him significantly.  Plan to start conservative management strategies.  Plan to start steroid injection today.  Can do this every 3 months.  Continue home exercise program recheck back as needed.  PDMP not reviewed this encounter. Orders Placed This Encounter  Procedures   [place] LIMITED JOINT SPACE STRUCTURES LOW LEFT(NO LINKED CHARGES)    Order Specific Question:   Reason for Exam (SYMPTOM  OR DIAGNOSIS REQUIRED)    Answer:   L hip pain    Order Specific Question:   Preferred imaging location?    Answer:   [facility]   No  orders of the defined types were placed in this encounter.   Discussed warning signs or symptoms. Please see discharge instructions. Patient expresses understanding.   The above documentation has been reviewed and is accurate and complete Lynne Leader, M.D.

## 2020-10-19 ENCOUNTER — Encounter: Payer: Self-pay | Admitting: Family Medicine

## 2020-10-19 ENCOUNTER — Ambulatory Visit: Payer: Self-pay

## 2020-10-19 ENCOUNTER — Other Ambulatory Visit: Payer: Self-pay

## 2020-10-19 ENCOUNTER — Ambulatory Visit: Payer: Medicare HMO | Admitting: Family Medicine

## 2020-10-19 VITALS — BP 140/78 | HR 77 | Ht 66.0 in | Wt 189.0 lb

## 2020-10-19 DIAGNOSIS — G8929 Other chronic pain: Secondary | ICD-10-CM | POA: Insufficient documentation

## 2020-10-19 DIAGNOSIS — M1612 Unilateral primary osteoarthritis, left hip: Secondary | ICD-10-CM

## 2020-10-19 DIAGNOSIS — M25552 Pain in left hip: Secondary | ICD-10-CM | POA: Diagnosis not present

## 2020-10-19 NOTE — Patient Instructions (Signed)
Thank you for coming in today.   Call or go to the ER if you develop a large red swollen joint with extreme pain or oozing puss.    I can do this injection every 3 months.  Let me know if you have a problem.   Eventually this hip may need to be replaced.    Continue to exercise.

## 2020-10-23 DIAGNOSIS — Z20822 Contact with and (suspected) exposure to covid-19: Secondary | ICD-10-CM | POA: Diagnosis not present

## 2020-10-31 ENCOUNTER — Other Ambulatory Visit: Payer: Self-pay | Admitting: Physician Assistant

## 2020-10-31 ENCOUNTER — Other Ambulatory Visit: Payer: Self-pay

## 2020-10-31 MED ORDER — FINASTERIDE 5 MG PO TABS: 5.0000 mg | ORAL_TABLET | Freq: Every day | ORAL | 1 refills | Status: DC

## 2020-10-31 MED ORDER — AMLODIPINE BESYLATE 5 MG PO TABS
5.0000 mg | ORAL_TABLET | Freq: Every day | ORAL | 1 refills | Status: DC
Start: 2020-10-31 — End: 2021-01-31

## 2020-11-06 DIAGNOSIS — R3912 Poor urinary stream: Secondary | ICD-10-CM | POA: Diagnosis not present

## 2020-11-06 DIAGNOSIS — N401 Enlarged prostate with lower urinary tract symptoms: Secondary | ICD-10-CM | POA: Diagnosis not present

## 2020-11-06 DIAGNOSIS — R350 Frequency of micturition: Secondary | ICD-10-CM | POA: Diagnosis not present

## 2020-11-07 ENCOUNTER — Telehealth: Payer: Self-pay

## 2020-11-07 DIAGNOSIS — C44321 Squamous cell carcinoma of skin of nose: Secondary | ICD-10-CM | POA: Diagnosis not present

## 2020-11-07 NOTE — Telephone Encounter (Signed)
Pt called stating that he has been having issues getting his Lipitor prescription refilled at  wants to know if the prescription can be printed out so he can come pick it up in office. Please Advise.

## 2020-11-08 NOTE — Telephone Encounter (Signed)
Per Alyssa's check out notes, pt does not need to be seen until 04/17/21 for med f/u and lab recheck. Does pt need appt for medication? Horrace was last seen on 9/13. Please Advise.

## 2020-11-08 NOTE — Telephone Encounter (Signed)
Appt needs to be scheduled 12/13/20

## 2020-11-08 NOTE — Telephone Encounter (Signed)
Left voice message for patient to call clinic.  

## 2020-11-08 NOTE — Telephone Encounter (Signed)
Rx was picked up today issues resolved

## 2020-11-24 NOTE — Progress Notes (Signed)
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 male with a hx of HLD, insomnia, chronic left hip pain, essential tremor, type 2 diabetes, OSA, and essential hypertension.  He was referred to cardiology by Raiford Noble, PA for an evaluation of his LVH.  He was last seen by Dr. Johney Frame on 11/29/2019.  During that time she was seen patient to reestablish care.  His previous cardiologist had retired.  He denied chest pain, palpitations, lightheadedness, dizziness, vision changes and frequent headaches.  He continues to stay active doing Zumba, chair yoga, and other cardiac activities.  He was exercising daily.  He had lost 30 pounds in the last 6 to 7 years.  He denied chest pain shortness of breath.  He reported that he had a COVID infection 6 months prior even though he had been vaccinated.  He noted mild symptoms for 2 days and denied residual symptoms.  He presents clinic today for follow-up evaluation states he feels well.  He continues to be very physically active doing Zumba 4 days/week.  He also enjoys traveling to casinos.  He has been monitoring his blood pressure at home and reports that it is in the 130s over 70s when he checks it.  He also enjoys eating [place] food but has cut back on his calories and continues to lose weight.  I will have him monitor his blood pressure weekly, continue his current medications, and follow-up in 12 months.  Today he denies chest pain, shortness of breath, lower extremity edema, fatigue, palpitations, melena, hematuria, hemoptysis, diaphoresis, weakness, presyncope, syncope, orthopnea, and PND.   Past Medical History:  Diagnosis Date    Anxiety    Diabetes mellitus    Enlarged heart    Hyperlipemia    Hypertension    Sleep apnea    on c-pap   Tubular adenoma of colon    2011    Past Surgical History:  Procedure Laterality Date   CATARACT EXTRACTION  2019   TOTAL SHOULDER ARTHROPLASTY  10/22/2011   Procedure: TOTAL SHOULDER ARTHROPLASTY;  Surgeon: Nita Sells, MD;  Location: [facility];  Service: Orthopedics;  Laterality: Right;  right total shoulder arthroplasty    Current Medications: Current Meds  Medication Sig   amLODipine (NORVASC) 5 MG tablet Take 1 tablet (5 mg total) by mouth daily.   atorvastatin (LIPITOR) 40 MG tablet TAKE ONE TABLET BY MOUTH ONE TIME DAILY   finasteride (PROSCAR) 5 MG tablet Take 1 tablet (5 mg total) by mouth daily.   metFORMIN (GLUCOPHAGE) 500 MG tablet Take 2 tablets (1,000 mg total) by mouth 2 (two) times daily with a meal.   Mirabegron ER (MYRBETRIQ) 8 MG/ML SRER Take 8 mg by mouth daily.   silodosin (RAPAFLO) 8 MG CAPS capsule Take 8 mg by mouth daily.   spironolactone (ALDACTONE) 25 MG tablet Take 1 tablet (25 mg total) by mouth daily.     Allergies:   Penicillin g   Social History   Socioeconomic History   Marital status: Married    Spouse name: Not on file   Number of children: Not on file  Years of education: Not on file   Highest education level: Not on file  Occupational History   Occupation: Retired  Tobacco Use   Smoking status: Never   Smokeless tobacco: Never  Vaping Use   Vaping Use: Never used  Substance and Sexual Activity   Alcohol use: Yes    Comment: 1 drink a month   Drug use: No   Sexual activity: Not Currently  Other Topics Concern   Not on file  Social History Narrative   Left handed   Social Determinants of Health   Financial Resource Strain: Low Risk    Difficulty of Paying Living Expenses: Not hard at all  Food Insecurity: No Food Insecurity   Worried About Charity fundraiser in the Last Year: Never true   [place]  in the Last Year: Never true  Transportation Needs: No Transportation Needs   Lack of Transportation (Medical): No   Lack of Transportation (Non-Medical): No  Physical Activity: Sufficiently Active   Days of Exercise per Week: 4 days   Minutes of Exercise per Session: 60 min  Stress: No Stress Concern Present   Feeling of Stress : Not at all  Social Connections: Moderately Integrated   Frequency of Communication with Friends and Family: More than three times a week   Frequency of Social Gatherings with Friends and Family: More than three times a week   Attends Religious Services: Never   Marine scientist or Organizations: Yes   Attends Archivist Meetings: 1 to 4 times per year   Marital Status: Married     Family History: The patient's family history includes Cancer in his mother and son; Heart disease in his father.  ROS:   Please see the history of present illness.     All other systems reviewed and are negative.   Risk Assessment/Calculations:           Physical Exam:    VS:  BP 124/78   Pulse 88   Ht 5\' 6"  (1.676 m)   Wt 187 lb 6.4 oz (85 kg)   BMI 30.25 kg/m     Wt Readings from Last 3 Encounters:  11/27/20 187 lb 6.4 oz (85 kg)  10/19/20 189 lb (85.7 kg)  10/17/20 189 lb 3.2 oz (85.8 kg)     GEN:  Well nourished, well developed in no acute distress HEENT: Normal NECK: No JVD; No carotid bruits LYMPHATICS: No lymphadenopathy CARDIAC: RRR, no murmurs, rubs, gallops RESPIRATORY:  Clear to auscultation without rales, wheezing or rhonchi  ABDOMEN: Soft, non-tender, non-distended MUSCULOSKELETAL:  No edema; No deformity  SKIN: Warm and dry NEUROLOGIC:  Alert and oriented x 3 PSYCHIATRIC:  Normal affect    EKGs/Labs/Other Studies Reviewed:    The following studies were reviewed today:  EKG 12/01/2019 Sinus rhythm probable left atrial enlargement, 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  The ekg ordered  today demonstrates normal sinus rhythm no ST or T wave deviation 88 bpm  Recent Labs: 10/17/2020: ALT 17; BUN 15; Creatinine, Ser 1.06; Hemoglobin 15.4; Platelets 243.0; Potassium 4.7; Sodium 137  Recent Lipid Panel    Component Value Date/Time   CHOL 140 10/17/2020 1147   TRIG 104.0 10/17/2020 1147   HDL 48.30 10/17/2020 1147   CHOLHDL 3 10/17/2020 1147   VLDL 20.8 10/17/2020 1147   LDLCALC 71 10/17/2020 1147    ASSESSMENT & PLAN    Essential hypertension-BP today 124/78.  Well-controlled at home.  Noted cough with lisinopril.  Lab work reviewed from 10/17/2020.  Creatinine stable at 1.06. Continue amlodipine, spironolactone Heart healthy low-sodium diet-salty 6 given Increase physical activity as tolerated  Hyperlipidemia-10/17/2020: Cholesterol 140; HDL 48.30; LDL Cholesterol 71; Triglycerides 104.0; VLDL 20.8 Continue atorvastatin Heart healthy low-sodium high-fiber diet.   Increase physical activity as tolerated   Disposition: Follow-up with Dr. Johney Frame or APP in 12 months.       Medication Adjustments/Labs and Tests Ordered: Current medicines are reviewed at length with the patient today.  Concerns regarding medicines are outlined above.  No orders of the defined types were placed in this encounter.  No orders of the defined types were placed in this encounter.   Patient Instructions  Medication Instructions:  No changes today *If you need a refill on your cardiac medications before your next appointment, please call your pharmacy*   Lab Work: None ordered.   Testing/Procedures: None ordered   Follow-Up: At [facility], you and your health needs are our priority.  As part of our continuing mission to provide you with exceptional heart care, we have created designated Provider Care Teams.  These Care Teams include your primary Cardiologist (physician) and Advanced Practice Providers (APPs -  Physician Assistants and Nurse Practitioners) who all work  together to provide you with the care you need, when you need it.  We recommend signing up for the patient portal called "MyChart".  Sign up information is provided on this After Visit Summary.  MyChart is used to connect with patients for Virtual Visits (Telemedicine).  Patients are able to view lab/test results, encounter notes, upcoming appointments, etc.  Non-urgent messages can be sent to your provider as well.   To learn more about what you can do with MyChart, go to [email or web address].    Your next appointment:   1 year(s)  The format for your next appointment:   In Person  Provider:   You may see Freada Bergeron, MD or one of the following Advanced Practice Providers on your designated Care Team:   Richardson Dopp, PA-C Vin Mitchell, [place]   Other Instructions Please check blood pressure 1 time per week and record on log. Continue with weekly Zumba classes!    Signed, Deberah Pelton, NP  11/27/2020 2:25 PM      Notice: This dictation was prepared with Dragon dictation along with smaller phrase technology. Any transcriptional errors that result from this process are unintentional and may not be corrected upon review.  I spent 14 minutes examining this patient, reviewing medications, and using patient centered shared decision making involving her cardiac care.  Prior to her visit I spent greater than 20 minutes reviewing her past medical history,  medications, and prior cardiac tests.

## 2020-11-27 ENCOUNTER — Other Ambulatory Visit: Payer: Self-pay

## 2020-11-27 ENCOUNTER — Encounter (HOSPITAL_BASED_OUTPATIENT_CLINIC_OR_DEPARTMENT_OTHER): Payer: Self-pay | Admitting: General Practice

## 2020-11-27 ENCOUNTER — Ambulatory Visit (HOSPITAL_BASED_OUTPATIENT_CLINIC_OR_DEPARTMENT_OTHER): Payer: Medicare HMO | Admitting: General Practice

## 2020-11-27 VITALS — BP 124/78 | HR 88 | Ht 66.0 in | Wt 187.4 lb

## 2020-11-27 DIAGNOSIS — I10 Essential (primary) hypertension: Secondary | ICD-10-CM | POA: Diagnosis not present

## 2020-11-27 DIAGNOSIS — E7800 Pure hypercholesterolemia, unspecified: Secondary | ICD-10-CM | POA: Diagnosis not present

## 2020-11-27 NOTE — Patient Instructions (Signed)
Medication Instructions:  No changes today *If you need a refill on your cardiac medications before your next appointment, please call your pharmacy*   Lab Work: None ordered.   Testing/Procedures: None ordered   Follow-Up: At [facility], you and your health needs are our priority.  As part of our continuing mission to provide you with exceptional heart care, we have created designated Provider Care Teams.  These Care Teams include your primary Cardiologist (physician) and Advanced Practice Providers (APPs -  Physician Assistants and Nurse Practitioners) who all work together to provide you with the care you need, when you need it.  We recommend signing up for the patient portal called "MyChart".  Sign up information is provided on this After Visit Summary.  MyChart is used to connect with patients for Virtual Visits (Telemedicine).  Patients are able to view lab/test results, encounter notes, upcoming appointments, etc.  Non-urgent messages can be sent to your provider as well.   To learn more about what you can do with MyChart, go to [email or web address].    Your next appointment:   1 year(s)  The format for your next appointment:   In Person  Provider:   You may see Danny Bergeron, MD or one of the following Advanced Practice Providers on your designated Care Team:   Danny Dopp, PA-C Danny Hunter, [place]   Other Instructions Please check blood pressure 1 time per week and record on log. Continue with weekly Zumba classes!

## 2020-11-28 NOTE — Addendum Note (Signed)
Addended by: Gerald Stabs on: 11/28/2020 08:10 AM   Modules accepted: Orders

## 2020-12-14 DIAGNOSIS — N401 Enlarged prostate with lower urinary tract symptoms: Secondary | ICD-10-CM | POA: Diagnosis not present

## 2020-12-14 DIAGNOSIS — R3912 Poor urinary stream: Secondary | ICD-10-CM | POA: Diagnosis not present

## 2020-12-14 DIAGNOSIS — R350 Frequency of micturition: Secondary | ICD-10-CM | POA: Diagnosis not present

## 2021-01-17 NOTE — Progress Notes (Signed)
I, Wendy Poet, LAT, ATC, am serving as scribe for Dr. Lynne Leader.  AADIT HAGOOD is a 80 y.o. 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  He was last seen by Dr. Georgina Snell on 10/19/20 and had a L hip joint steroid injection.  Today, pt reports he got relief from prior steroid injection. Pt notes L hip is better than it was previously, but is still painful. Pt is taking care of his wife w/ Parkinson's and Alzheimer's and is not able to have surgery until she gets into a nursing home.  Diagnostic testing: L hip and L knee XR- 04/28/20  Pertinent review of systems: No fevers or chills  Relevant historical information: Hypertension.  Diabetes.   Exam:  BP (!) 142/86    Pulse 76    Ht 5\' 6"  (1.676 m)    Wt 185 lb 9.6 oz (84.2 kg)    SpO2 97%    BMI 29.96 kg/m  General: Well Developed, well nourished, and in no acute distress.   MSK: Left hip normal-appearing normal motion pain with flexion and internal rotation.    Lab and Radiology Results  Procedure: Real-time Ultrasound Guided Injection of left hip intra-articular Device: Philips Affiniti 50G Images permanently stored and available for review in PACS Verbal informed consent obtained.  Discussed risks and benefits of procedure. Warned about infection bleeding damage to structures skin hypopigmentation and fat atrophy among others. Patient expresses understanding and agreement Time-out conducted.   Noted no overlying erythema, induration, or other signs of local infection.   Skin prepped in a sterile fashion.   Local anesthesia: Topical Ethyl chloride.   With sterile technique and under real time ultrasound guidance: 40 mg of Kenalog and 2 mL of Marcaine injected into the hip joint. Fluid seen entering the joint capsule.   Completed without difficulty   Pain immediately resolved suggesting accurate placement of the medication.   Advised to  call if fevers/chills, erythema, induration, drainage, or persistent bleeding.   Images permanently stored and available for review in the ultrasound unit.  Impression: Technically successful ultrasound guided injection.     EXAM: DG HIP (WITH OR WITHOUT PELVIS) 2-3V LEFT   COMPARISON:  None   FINDINGS: Degenerative changes in the left hip with mild loss of joint space relative to the right. No other abnormalities.   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 No other abnormalities.     Electronically Signed   By: Dorise Bullion III M.D   On: 04/30/2020 12:05   I, Lynne Leader, personally (independently) visualized and performed the interpretation of the images attached in this note.     Assessment and Plan: 80 y.o. male with left hip pain due to exacerbation of DJD.  Plan for repeat steroid injection.  Last injection worked pretty well.  We will continue these injections about every 3 months till they stop working.  He cannot easily have a hip replacement now because he is providing care for his wife.  Recheck in 3 months for reassessment and potential injection.   PDMP not reviewed this encounter. Orders Placed This Encounter  Procedures   [place] LIMITED JOINT SPACE STRUCTURES LOW LEFT(NO LINKED CHARGES)    Standing Status:   Future    Number of Occurrences:   1    Standing Expiration Date:   07/19/2021  Order Specific Question:   Reason for Exam (SYMPTOM  OR DIAGNOSIS REQUIRED)    Answer:   left hip pain    Order Specific Question:   Preferred imaging location?    Answer:   [place]   No orders of the defined types were placed in this encounter.    Discussed warning signs or symptoms. Please see discharge instructions. Patient expresses understanding.   The above documentation has been reviewed and is accurate and complete Lynne Leader, M.D.

## 2021-01-18 ENCOUNTER — Ambulatory Visit: Payer: Self-pay

## 2021-01-18 ENCOUNTER — Ambulatory Visit (INDEPENDENT_AMBULATORY_CARE_PROVIDER_SITE_OTHER): Payer: Medicare HMO | Admitting: Family Medicine

## 2021-01-18 ENCOUNTER — Other Ambulatory Visit: Payer: Self-pay

## 2021-01-18 VITALS — BP 142/86 | HR 76 | Ht 66.0 in | Wt 185.6 lb

## 2021-01-18 DIAGNOSIS — M1612 Unilateral primary osteoarthritis, left hip: Secondary | ICD-10-CM | POA: Diagnosis not present

## 2021-01-18 DIAGNOSIS — M25552 Pain in left hip: Secondary | ICD-10-CM

## 2021-01-18 DIAGNOSIS — G8929 Other chronic pain: Secondary | ICD-10-CM

## 2021-01-18 NOTE — Patient Instructions (Signed)
Thank you for coming in today.   You received a steroid injection in your left hip today. Seek immediate medical attention if the joint becomes red, extremely painful, or is oozing fluid.   I can repeat the steroid injection every 3 months if needed.  Recheck back as needed

## 2021-01-30 ENCOUNTER — Telehealth: Payer: Self-pay | Admitting: Physician Assistant

## 2021-01-30 ENCOUNTER — Telehealth: Payer: Self-pay

## 2021-01-30 NOTE — Telephone Encounter (Signed)
Pt and [facility] are going into [facility] and need paperwork done and need QF gold TB test done as well, they need done before the end of this week 02/02/21. Call back is [phone] for daughter. Judeen Hammans for [facility] is faxing over paperwork today. They weren't sure what to do since Alyssa is out today

## 2021-01-30 NOTE — Telephone Encounter (Signed)
MEDICATION: amLODipine (NORVASC) 5 MG tablet  PHARMACY:  [id number] - [place], [place] Phone:  [phone]  Fax:  [phone]      Comments:   **Let patient know to contact pharmacy at the end of the day to make sure medication is ready. **  ** Please notify patient to allow 48-72 hours to process**  **Encourage patient to contact the pharmacy for refills or they can request refills through [facility]**

## 2021-01-31 ENCOUNTER — Other Ambulatory Visit: Payer: Self-pay

## 2021-01-31 ENCOUNTER — Other Ambulatory Visit: Payer: Medicare HMO

## 2021-01-31 ENCOUNTER — Ambulatory Visit (INDEPENDENT_AMBULATORY_CARE_PROVIDER_SITE_OTHER): Payer: Medicare HMO

## 2021-01-31 DIAGNOSIS — Z111 Encounter for screening for respiratory tuberculosis: Secondary | ICD-10-CM

## 2021-01-31 DIAGNOSIS — Z23 Encounter for immunization: Secondary | ICD-10-CM | POA: Diagnosis not present

## 2021-01-31 MED ORDER — ATORVASTATIN CALCIUM 40 MG PO TABS: 40.0000 mg | ORAL_TABLET | Freq: Every day | ORAL | 0 refills | Status: DC

## 2021-01-31 MED ORDER — AMLODIPINE BESYLATE 5 MG PO TABS: 5.0000 mg | ORAL_TABLET | Freq: Every day | ORAL | 1 refills | Status: DC

## 2021-01-31 NOTE — Telephone Encounter (Signed)
Rx sent in

## 2021-02-02 NOTE — Telephone Encounter (Signed)
Pt had GF gold done 01/31/21, ordered by Loralyn Freshwater, [place]

## 2021-02-02 NOTE — Telephone Encounter (Signed)
Forms Faxed waiting for results

## 2021-02-03 LAB — QUANTIFERON-TB GOLD PLUS
Mitogen-NIL: >10 IU/mL
NIL: 2.3 IU/mL
QuantiFERON-TB Gold Plus: POSITIVE — AB
TB1-NIL: 8.72 IU/mL
TB2-NIL: 9.34 IU/mL

## 2021-02-06 ENCOUNTER — Other Ambulatory Visit: Payer: Self-pay

## 2021-02-06 ENCOUNTER — Ambulatory Visit (INDEPENDENT_AMBULATORY_CARE_PROVIDER_SITE_OTHER)
Admission: RE | Admit: 2021-02-06 | Discharge: 2021-02-06 | Disposition: A | Discharge status: Home / Self Care | Payer: Medicare HMO | Source: Ambulatory Visit | Attending: Physician Assistant | Admitting: Physician Assistant

## 2021-02-06 DIAGNOSIS — Z961 Presence of intraocular lens: Secondary | ICD-10-CM | POA: Diagnosis not present

## 2021-02-06 DIAGNOSIS — E119 Type 2 diabetes mellitus without complications: Secondary | ICD-10-CM | POA: Diagnosis not present

## 2021-02-06 DIAGNOSIS — H43813 Vitreous degeneration, bilateral: Secondary | ICD-10-CM | POA: Diagnosis not present

## 2021-02-06 DIAGNOSIS — H524 Presbyopia: Secondary | ICD-10-CM | POA: Diagnosis not present

## 2021-02-06 DIAGNOSIS — R7611 Nonspecific reaction to tuberculin skin test without active tuberculosis: Secondary | ICD-10-CM

## 2021-02-06 DIAGNOSIS — J9811 Atelectasis: Secondary | ICD-10-CM | POA: Diagnosis not present

## 2021-02-06 NOTE — Telephone Encounter (Signed)
Forms faxed

## 2021-02-06 NOTE — Telephone Encounter (Addendum)
Danny Hunter with [facility] is requesting call back with results at [phone].  Is requesting faxed to [phone].

## 2021-02-08 ENCOUNTER — Other Ambulatory Visit: Payer: Self-pay | Admitting: Physician Assistant

## 2021-02-08 DIAGNOSIS — R7612 Nonspecific reaction to cell mediated immunity measurement of gamma interferon antigen response without active tuberculosis: Secondary | ICD-10-CM

## 2021-02-14 ENCOUNTER — Ambulatory Visit (INDEPENDENT_AMBULATORY_CARE_PROVIDER_SITE_OTHER): Payer: Medicare HMO | Admitting: Physician Assistant

## 2021-02-14 ENCOUNTER — Other Ambulatory Visit: Payer: Medicare HMO

## 2021-02-14 ENCOUNTER — Other Ambulatory Visit: Payer: Self-pay

## 2021-02-14 VITALS — BP 125/74 | Temp 98.0°F | Ht 66.0 in | Wt 182.2 lb

## 2021-02-14 DIAGNOSIS — E119 Type 2 diabetes mellitus without complications: Secondary | ICD-10-CM | POA: Diagnosis not present

## 2021-02-14 DIAGNOSIS — R69 Illness, unspecified: Secondary | ICD-10-CM | POA: Diagnosis not present

## 2021-02-14 DIAGNOSIS — R7612 Nonspecific reaction to cell mediated immunity measurement of gamma interferon antigen response without active tuberculosis: Secondary | ICD-10-CM

## 2021-02-14 DIAGNOSIS — F439 Reaction to severe stress, unspecified: Secondary | ICD-10-CM | POA: Diagnosis not present

## 2021-02-14 DIAGNOSIS — J9811 Atelectasis: Secondary | ICD-10-CM | POA: Diagnosis not present

## 2021-02-14 NOTE — Progress Notes (Signed)
Subjective:    Patient ID: Danny Hunter, male    DOB: 04-Mar-1940, 81 y.o.   MRN: [id number]  Chief Complaint  Patient presents with   Follow-up    HPI Patient is in today for follow-up discussion status post positive TB Gold test and chest x-ray.  Patient has moved into assisted living home with his wife who is currently going through dementia and Parkinson's disease.  It was required to have a TB test done.  He had a positive quantiferonTB Gold plus result on 01/31/2021.  He then had a chest x-ray done on 02/06/2021 which showed no signs of TB, but did show mild bibasilar atelectasis.  Never lived in another country. Only has visited [place] and [place] short-term.  Never in the prison system or group homes. No exposure to TB ever known.   No known lung disease. No smoking history. Wife smoked during their marriage.  No recent sickness. Feels great per patient.  No cough, no night sweats.  He has been losing some weight, but says he is actively trying by watching what he eats and he is also stressed 2/2 wife's Parkinson's diagnosis.    He would also like to know what his hemoglobin A1c status is.  He has been taking metformin 1000 mg twice daily and trying to watch his diet.  Denies any symptoms or problems with diabetes.   Past Medical History:  Diagnosis Date   Anxiety    Diabetes mellitus    Enlarged heart    Hyperlipemia    Hypertension    Sleep apnea    on c-pap   Tubular adenoma of colon    2011    Past Surgical History:  Procedure Laterality Date   CATARACT EXTRACTION  2019   TOTAL SHOULDER ARTHROPLASTY  10/22/2011   Procedure: TOTAL SHOULDER ARTHROPLASTY;  Surgeon: Nita Sells, MD;  Location: [place];  Service: Orthopedics;  Laterality: Right;  right total shoulder arthroplasty    Family History  Problem Relation Age of Onset   Cancer Mother    Heart disease Father    Cancer Son     Social History   Tobacco Use   Smoking status: Never    Smokeless tobacco: Never  Vaping Use   Vaping Use: Never used  Substance Use Topics   Alcohol use: Yes    Comment: 1 drink a month   Drug use: No     Allergies  Allergen Reactions   Penicillin G Other (See Comments)    Review of Systems NEGATIVE UNLESS OTHERWISE INDICATED IN HPI      Objective:     BP 125/74    Temp 98 F (36.7 C)    Ht 5\' 6"  (1.676 m)    Wt 182 lb 3.2 oz (82.6 kg)    BMI 29.41 kg/m   Wt Readings from Last 3 Encounters:  02/14/21 182 lb 3.2 oz (82.6 kg)  01/18/21 185 lb 9.6 oz (84.2 kg)  11/27/20 187 lb 6.4 oz (85 kg)    BP Readings from Last 3 Encounters:  02/14/21 125/74  01/18/21 (!) 142/86  11/27/20 124/78     Physical Exam Vitals and nursing note reviewed.  Constitutional:      Appearance: Normal appearance.  HENT:     Head: Normocephalic.  Eyes:     Extraocular Movements: Extraocular movements intact.     Pupils: Pupils are equal, round, and reactive to light.  Cardiovascular:     Rate and Rhythm: Normal rate  and regular rhythm.     Pulses: Normal pulses.     Heart sounds: Normal heart sounds.  Pulmonary:     Effort: Pulmonary effort is normal. No respiratory distress.     Breath sounds: Normal breath sounds. No wheezing, rhonchi or rales.  Neurological:     General: No focal deficit present.     Mental Status: He is alert and oriented to person, place, and time.  Psychiatric:        Mood and Affect: Mood normal.        Behavior: Behavior normal.       Assessment & Plan:   Problem List Items Addressed This Visit   None Visit Diagnoses     Positive QuantiFERON-TB Gold test    -  Primary   Controlled type 2 diabetes mellitus without complication, without long-term current use of insulin (HCC)       Relevant Orders   POCT HgB A1C (Completed)   Mild bibasilar atelectasis       Stress at home           1. Positive QuantiFERON-TB Gold test Spoke with patient that I have reached out to infectious disease regarding this  situation.  We plan to repeat the blood test to confirm its positivity.  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  Thankfully chest x-ray showed no active signs of TB and he does not have any symptoms.  2. Controlled type 2 diabetes mellitus without complication, without long-term current use of insulin (HCC) Hemoglobin A1c is 7.0 in office.  This is stable.  He will continue his metformin 1000 mg twice daily.  He is doing well with diet and exercise.  3. Mild bibasilar atelectasis Unsure etiology at this point.  Encouraged him to work on deep breathing exercises and we can repeat chest x-ray in about 4 weeks.  4. Stress at home Discussed stress reducing measures and he will continue to reach out to me about the care of himself and his wife during this difficult time with the progression of her disease.    This note was prepared with assistance of Systems analyst. Occasional wrong-word or sound-a-like substitutions may have occurred due to the inherent limitations of voice recognition software.  Time Spent: 37 minutes of total time was spent on the date of the encounter performing the following actions: chart review prior to seeing the patient, obtaining history, performing a medically necessary exam, counseling on the treatment plan, placing orders, and documenting in our EHR.    Marrianne Sica M Daryl Quiros, PA-C

## 2021-02-14 NOTE — Patient Instructions (Signed)
Please schedule f/up with me in 4 weeks for recheck Chest XRAY and recheck weight.  Work on deep breathing exercises at home.  Continue to work on stress reducing hobbies.  Call if any concerns.

## 2021-02-16 LAB — POCT GLYCOSYLATED HEMOGLOBIN (HGB A1C): Hemoglobin A1C: 7 % — AB (ref 4.0–5.6)

## 2021-02-19 ENCOUNTER — Telehealth: Payer: Self-pay

## 2021-02-19 LAB — HIV ANTIBODY (ROUTINE TESTING W REFLEX): HIV 1&2 Ab, 4th Generation: NONREACTIVE

## 2021-02-19 LAB — QUANTIFERON-TB GOLD PLUS
Mitogen-NIL: >10 IU/mL
NIL: 3.52 IU/mL
QuantiFERON-TB Gold Plus: POSITIVE — AB
TB1-NIL: >10 IU/mL
TB2-NIL: >10 IU/mL

## 2021-02-19 NOTE — Telephone Encounter (Signed)
Patient is calling back stating he took a "test" the other day.  Said he thought he had seen the results on mychart but can no longer see them.  Is requesting a call back from [place].

## 2021-02-20 ENCOUNTER — Other Ambulatory Visit: Payer: Self-pay

## 2021-02-20 DIAGNOSIS — R7612 Nonspecific reaction to cell mediated immunity measurement of gamma interferon antigen response without active tuberculosis: Secondary | ICD-10-CM

## 2021-02-20 NOTE — Telephone Encounter (Signed)
See results notes. 

## 2021-02-21 DIAGNOSIS — R7612 Nonspecific reaction to cell mediated immunity measurement of gamma interferon antigen response without active tuberculosis: Secondary | ICD-10-CM | POA: Insufficient documentation

## 2021-02-22 ENCOUNTER — Ambulatory Visit: Payer: Medicare HMO | Admitting: Internal Medicine

## 2021-02-22 ENCOUNTER — Other Ambulatory Visit: Payer: Self-pay

## 2021-02-22 ENCOUNTER — Encounter: Payer: Self-pay | Admitting: Internal Medicine

## 2021-02-22 DIAGNOSIS — R7612 Nonspecific reaction to cell mediated immunity measurement of gamma interferon antigen response without active tuberculosis: Secondary | ICD-10-CM

## 2021-02-22 NOTE — Assessment & Plan Note (Signed)
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  He does not have any evidence of active tuberculosis that would pose a risk to those around him.  He is at relatively low risk for progression to active tuberculosis and I do not feel that he needs treatment for latent tuberculosis at this time.  His only risk factor for progression other than his age is diabetes but it is well controlled with a recent hemoglobin A1c of 6.8.

## 2021-02-22 NOTE — Progress Notes (Signed)
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  He does not have any evidence of active tuberculosis that would pose a risk to those around him.  He is at relatively low risk for progression to active tuberculosis and I do not feel that he needs treatment for latent tuberculosis at this time.  His only risk factor for progression other than his age is diabetes but it is well controlled with a recent hemoglobin A1c of 6.8.   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   ATORVASTATIN (LIPITOR) 40 MG TABLET    Take 1 tablet (40 mg total) by mouth daily.   FINASTERIDE (PROSCAR) 5 MG TABLET    Take 1 tablet (5 mg total) by mouth daily.   METFORMIN (GLUCOPHAGE) 500 MG TABLET    Take 2 tablets (1,000 mg total) by mouth 2 (two) times daily with a meal.   MIRABEGRON ER (MYRBETRIQ) 8 MG/ML SRER    Take 8 mg by mouth daily.   SILODOSIN (RAPAFLO) 8 MG CAPS CAPSULE    Take 8 mg by mouth daily.    SPIRONOLACTONE (ALDACTONE) 25 MG TABLET    Take 1 tablet (25 mg total) by mouth daily.  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  In order to enter the facility he was screened for TB with a QuantiFERON gold TB assay.  It was positive x2 recently.  He says that he has been in relatively good health throughout his life.  Initially, he said he had never had any known exposures to TB but then recalled that he had an aunt who might have been hospitalized with TB when he was 71 or 81 years old.  He did not spend much time around her.  He has never had pneumonia that he knows of.  His only travel outside of the [place] has been short trips to [place] and [place].  He has never served in the [employer].  He has never been incarcerated.  A recent chest x-ray did not reveal any evidence of active TB or an active Gohn complex.  He has not had any recent fever.  He has lost about 50 pounds over the last several years but that has been intentional and due to changes in his diet.  He  does not have any cough, sputum production and is never coughed up any blood.  Review of Systems: Review of Systems  Constitutional:  Positive for weight loss. Negative for diaphoresis, fever and malaise/fatigue.  Respiratory:  Negative for cough, hemoptysis, sputum production and shortness of breath.   Cardiovascular:  Negative for chest pain.  Genitourinary:        Nocturia x3     Past Medical History:  Diagnosis Date   Anxiety    Diabetes mellitus    Enlarged heart    Hyperlipemia    Hypertension    Sleep apnea    on c-pap   Tubular adenoma of colon    2011    Social History   Tobacco Use   Smoking status: Never   Smokeless tobacco: Never  Vaping Use   Vaping Use: Never used   Substance Use Topics   Alcohol use: Yes    Comment: 1 drink a month   Drug use: No    Family History  Problem Relation Age of Onset   Cancer Mother    Heart disease Father    Cancer Son    Allergies  Allergen Reactions   Penicillin G Other (See Comments)    OBJECTIVE: There were no vitals filed for this visit. There is no height or weight on file to calculate BMI.   Physical Exam Constitutional:      Comments: He is very pleasant and in good spirits.  Cardiovascular:     Rate and Rhythm: Normal rate and regular rhythm.     Heart sounds: No murmur heard. Pulmonary:     Effort: Pulmonary effort is normal.     Breath sounds: Normal breath sounds. No wheezing, rhonchi or rales.  Psychiatric:        Mood and Affect: Mood normal.    Microbiology: No results found for this or any previous visit (from the past 240 hour(s)).  Michel Bickers, MD [facility] for [facility] [phone] pager   [phone] cell 02/22/2021, 8:47 AM

## 2021-03-05 ENCOUNTER — Telehealth: Payer: Self-pay

## 2021-03-05 NOTE — Telephone Encounter (Signed)
Pt called back and will be in to pick up forms

## 2021-03-05 NOTE — Telephone Encounter (Signed)
Left voice message for patient to call clinic. Form competed and ready for pick up

## 2021-03-05 NOTE — Telephone Encounter (Signed)
Patient has called stating that he has dropped forms off last week to be completed last week.  States he needs to know if forms will be completed.    Is requesting call back at [phone].

## 2021-03-15 ENCOUNTER — Encounter: Payer: Self-pay | Admitting: Physician Assistant

## 2021-03-15 ENCOUNTER — Ambulatory Visit (INDEPENDENT_AMBULATORY_CARE_PROVIDER_SITE_OTHER): Payer: Medicare HMO | Admitting: Physician Assistant

## 2021-03-15 ENCOUNTER — Other Ambulatory Visit: Payer: Self-pay

## 2021-03-15 VITALS — BP 121/79 | Temp 97.4°F | Ht 66.0 in | Wt 180.2 lb

## 2021-03-15 DIAGNOSIS — R7612 Nonspecific reaction to cell mediated immunity measurement of gamma interferon antigen response without active tuberculosis: Secondary | ICD-10-CM | POA: Diagnosis not present

## 2021-03-15 DIAGNOSIS — R69 Illness, unspecified: Secondary | ICD-10-CM | POA: Diagnosis not present

## 2021-03-15 DIAGNOSIS — E119 Type 2 diabetes mellitus without complications: Secondary | ICD-10-CM

## 2021-03-15 DIAGNOSIS — F439 Reaction to severe stress, unspecified: Secondary | ICD-10-CM

## 2021-03-15 NOTE — Progress Notes (Signed)
Subjective:    Patient ID: Danny Hunter, male    DOB: 1940/05/25, 81 y.o.   MRN: [id number]  Chief Complaint  Patient presents with   Follow-up    Xray results     HPI Patient is in today for f/up discussion about positive TB gold test and discussion about stress at home. Pt states that he followed up with infectious disease about his TB test and he was advised against treatment for latent TB due to low risk for progression to active TB. He also states that he has been doing well in his new living situation with his wife in assisted living with her. They are keeping busy and making new friends. His wife's disease is progressing, but he says she is happy in their new quarters and this is helpful for him as well. He denies any depression or anxiety.   Past Medical History:  Diagnosis Date   Anxiety    Diabetes mellitus    Enlarged heart    Hyperlipemia    Hypertension    Sleep apnea    on c-pap   Tubular adenoma of colon    2011    Past Surgical History:  Procedure Laterality Date   CATARACT EXTRACTION  2019   TOTAL SHOULDER ARTHROPLASTY  10/22/2011   Procedure: TOTAL SHOULDER ARTHROPLASTY;  Surgeon: Nita Sells, MD;  Location: [place];  Service: Orthopedics;  Laterality: Right;  right total shoulder arthroplasty    Family History  Problem Relation Age of Onset   Cancer Mother    Heart disease Father    Cancer Son     Social History   Tobacco Use   Smoking status: Never   Smokeless tobacco: Never  Vaping Use   Vaping Use: Never used  Substance Use Topics   Alcohol use: Yes    Comment: 1 drink a month   Drug use: No     Allergies  Allergen Reactions   Penicillin G Other (See Comments)    Review of Systems NEGATIVE UNLESS OTHERWISE INDICATED IN HPI      Objective:     BP 121/79    Temp (!) 97.4 F (36.3 C) (Temporal)    Ht 5\' 6"  (1.676 m)    Wt 180 lb 3.2 oz (81.7 kg)    SpO2 100%    BMI 29.09 kg/m   Wt Readings from Last 3 Encounters:   03/15/21 180 lb 3.2 oz (81.7 kg)  02/22/21 180 lb (81.6 kg)  02/14/21 182 lb 3.2 oz (82.6 kg)    BP Readings from Last 3 Encounters:  03/15/21 121/79  02/22/21 133/75  02/14/21 125/74     Physical Exam Vitals and nursing note reviewed.  Constitutional:      Appearance: Normal appearance.  HENT:     Head: Normocephalic.  Cardiovascular:     Rate and Rhythm: Normal rate and regular rhythm.  Pulmonary:     Effort: Pulmonary effort is normal.  Neurological:     General: No focal deficit present.     Mental Status: He is alert and oriented to person, place, and time.  Psychiatric:        Mood and Affect: Mood normal.        Behavior: Behavior normal.       Assessment & Plan:   Problem List Items Addressed This Visit       Other   Positive QuantiFERON-TB Gold test   Other Visit Diagnoses     Stress at home    -  Primary        1. Stress at home -Things are relatively stable, no depression or anxiety evident. Encouraged him to keep active and social. He knows to reach out if he has any concerns prior to next appointment.   2. Positive QuantiFERON-TB Gold test -I personally reviewed record from [place]. Latent TB, low risk, does not need treatment.     This note was prepared with assistance of Systems analyst. Occasional wrong-word or sound-a-like substitutions may have occurred due to the inherent limitations of voice recognition software.  Time Spent: 17 minutes of total time was spent on the date of the encounter performing the following actions: chart review prior to seeing the patient, obtaining history, performing a medically necessary exam, counseling on the treatment plan, placing orders, and documenting in our EHR.    Maryalice Pasley M Lemond Griffee, PA-C

## 2021-04-13 NOTE — Progress Notes (Signed)
? ?I, Wendy Poet, LAT, ATC, am serving as scribe for Dr. Lynne Leader. ? ?Danny Hunter is a 81 y.o. male who presents to [facility] today for f/u of L ant hip/groin pain thought to be due to hip OA/DJD.  He was last seen by Dr. Georgina Snell on 01/18/21 and had a L hip intra-articular steroid injection.  Pt is taking care of his wife w/ Parkinson's and Alzheimer's and is not able to have surgery until she gets into a nursing home.  Today, pt reports no relief from prior L hip steroid injection.  ? ?Pt c/o L knee pain ongoing for about 1 month. Pt notes pain is intermittent. Pt is unable to specify the location of his pain. Pt had a recent change to his living arrangements and is now living at [facility] so his wife can be cared for. Pt would like to see if he can do PT there. ? ?Diagnostic testing: L hip and L knee XR- 04/28/20 ? ?Pertinent review of systems: No fevers or chills ? ?Relevant historical information: Patient does a lot of caregiving for his wife who has Parkinson's disease and Alzheimer's however they both recently moved into an assisted living facility so he will have less caregiving demands. ? ? ?Exam:  ?BP 122/76   Pulse 80   Ht '5\' 6"'$  (1.676 m)   Wt 178 lb 12.8 oz (81.1 kg)   SpO2 96%   BMI 28.86 kg/m?  ?General: Well Developed, well nourished, and in no acute distress.  ? ?MSK: Left hip decreased range of motion.  Pain with internal rotation. ?Left knee normal.  Normal motion with crepitation. ? ? ? ?Lab and Radiology Results ? ? ?X-ray images left knee obtained today personally and independently interpreted ?Mild DJD.  No acute fractures. ?Await formal radiology review ? ? ? ?Assessment and Plan: ?81 y.o. male with left hip pain.  Primary issue today.  Pain thought to be due to DJD.  He has had good results with intra-articular steroid injection however unfortunately this has become temporary now.  At this point I think his best bet is  surgical consultation to discuss hip replacement.  His last x-ray now is a-year-old.  I expect that orthopedic surgeon will like new x-rays for just hardware sizing so I will get x-rays myself and allow the surgeon to get the appropriate x-rays for his needs. ?We spent time talking about hip replacement surgeries.  Plan to refer to orthopedic surgery. ? ?Additionally has some mild left knee pain.  This is also thought to be due to DJD exacerbation.  He would like to focus on his hip for now but can return as needed for knee injection.  We will consider hyaluronic acid injections if needed to minimize steroid effects around the time of a hip replacement. ? ? ?PDMP not reviewed this encounter. ?Orders Placed This Encounter  ?Procedures  ? DG Knee AP/LAT W/[place] Left  ?  Standing Status:   Future  ?  Number of Occurrences:   1  ?  Standing Expiration Date:   05/17/2021  ?  Order Specific Question:   Reason for Exam (SYMPTOM  OR DIAGNOSIS REQUIRED)  ?  Answer:   left knee pain  ?  Order Specific Question:   Preferred imaging location?  ?  Answer:   [facility]  ? Ambulatory referral to Orthopedic Surgery  ?  Referral Priority:   Routine  ?  Referral Type:  Surgical  ?  Referral Reason:   Specialty Services Required  ?  Requested Specialty:   Orthopedic Surgery  ?  Number of Visits Requested:   1  ? ?No orders of the defined types were placed in this encounter. ? ? ? ?Discussed warning signs or symptoms. Please see discharge instructions. Patient expresses understanding. ? ? ?The above documentation has been reviewed and is accurate and complete Lynne Leader, M.D. ? ? ?

## 2021-04-16 ENCOUNTER — Ambulatory Visit: Payer: Self-pay

## 2021-04-16 ENCOUNTER — Ambulatory Visit: Payer: Medicare HMO | Admitting: Family Medicine

## 2021-04-16 ENCOUNTER — Ambulatory Visit (INDEPENDENT_AMBULATORY_CARE_PROVIDER_SITE_OTHER): Payer: Medicare HMO

## 2021-04-16 ENCOUNTER — Other Ambulatory Visit: Payer: Self-pay

## 2021-04-16 VITALS — BP 122/76 | HR 80 | Ht 66.0 in | Wt 178.8 lb

## 2021-04-16 DIAGNOSIS — G8929 Other chronic pain: Secondary | ICD-10-CM

## 2021-04-16 DIAGNOSIS — M25562 Pain in left knee: Secondary | ICD-10-CM | POA: Diagnosis not present

## 2021-04-16 DIAGNOSIS — M25552 Pain in left hip: Secondary | ICD-10-CM | POA: Diagnosis not present

## 2021-04-16 DIAGNOSIS — M1712 Unilateral primary osteoarthritis, left knee: Secondary | ICD-10-CM | POA: Diagnosis not present

## 2021-04-16 NOTE — Patient Instructions (Addendum)
Thank you for coming in today.  ? ?I've referred you to orthopedic surgery so you can have a talk with them about potential surgery.  ? ?Please get an Xray today before you leave  ? ?Recheck back as needed ?

## 2021-04-17 ENCOUNTER — Ambulatory Visit: Payer: Medicare HMO | Admitting: Physician Assistant

## 2021-04-17 NOTE — Progress Notes (Signed)
Left knee x-ray shows some arthritis underneath the kneecap.

## 2021-04-19 ENCOUNTER — Ambulatory Visit: Payer: Medicare HMO | Admitting: Family Medicine

## 2021-04-26 ENCOUNTER — Ambulatory Visit (INDEPENDENT_AMBULATORY_CARE_PROVIDER_SITE_OTHER): Payer: Medicare HMO

## 2021-04-26 ENCOUNTER — Ambulatory Visit (INDEPENDENT_AMBULATORY_CARE_PROVIDER_SITE_OTHER): Payer: Medicare HMO | Admitting: Orthopaedic Surgery

## 2021-04-26 ENCOUNTER — Other Ambulatory Visit: Payer: Self-pay

## 2021-04-26 VITALS — Ht 66.0 in | Wt 175.0 lb

## 2021-04-26 DIAGNOSIS — M1612 Unilateral primary osteoarthritis, left hip: Secondary | ICD-10-CM | POA: Diagnosis not present

## 2021-04-26 NOTE — Progress Notes (Signed)
? ?Office Visit Note ?  ?Patient: Danny Hunter           ?Date of Birth: Dec 06, 1940           ?MRN: [id number] ?Visit Date: 04/26/2021 ?             ?Requested by: Gregor Hams, MD ?[place],  [place] [address] ?PCP: Allwardt, Randa Evens, PA-C ? ? ?Assessment & Plan: ?Visit Diagnoses:  ?1. Arthritis of left hip   ?2. Unilateral primary osteoarthritis, left hip   ? ? ?Plan: I spoke with him in length in detail about hip replacement surgery.  We went over his x-rays and a hip replacement model.  I then gave him a handout about hip replacement surgery.  Given his x-ray findings and his clinical exam findings as well as the signs and symptoms, I agree that he is a candidate for this type of surgery.  We talked about the risks and benefits of surgery and what to expect from an intraoperative and postoperative course.  All questions and concerns were answered and addressed.  He is interested in having this scheduled and he has our surgery scheduler's card.  We will be in touch. ? ?Follow-Up Instructions: Return for 2 weeks post-op.  ? ?Orders:  ?Orders Placed This Encounter  ?Procedures  ? XR HIP UNILAT W OR W/O PELVIS 2-3 VIEWS LEFT  ? ?No orders of the defined types were placed in this encounter. ? ? ? ? Procedures: ?No procedures performed ? ? ?Clinical Data: ?No additional findings. ? ? ?Subjective: ?Chief Complaint  ?Patient presents with  ? Left Hip - Pain  ?The patient is a very pleasant 81 year old gentleman who comes in today for evaluation treatment of known severe osteoarthritis of his left hip and wanting to discuss the potential for hip replacement surgery.  He has had conservative treatment including at least 2 intra-articular injections in his left hip joint.  These were done under radiographic guidance.  The first injection did help but the second 1 gave no relief for his left hip and groin pain.  It has been getting worse for over a year now but the last 3 to 4 months it has been getting  worse.  He walks with a limp.  It is detrimentally affecting his mobility, his quality of life and his activities of daily living.  He is interested in considering hip replacement surgery.  He does live at a facility with his wife who has Alzheimer's disease.  He said a lot of scheduling surgery will revolve around his family and being able to still take care of his wife.  He has no significant active medical issues.  He is a diabetic but reports excellent blood glucose control. ? ?HPI ? ?Review of Systems ?There is no listed headache, chest pain, shortness of breath, fever, chills, nausea, vomiting ? ?Objective: ?Vital Signs: Ht '5\' 6"'$  (1.676 m)   Wt 175 lb (79.4 kg)   BMI 28.25 kg/m?  ? ?Physical Exam ?He is alert and orient x3 and in no acute distress ?Ortho Exam ?Examination of his left hip shows significant pain with internal and external rotation as well as severe stiffness with rotation.  His right hip moves normally. ?Specialty Comments:  ?No specialty comments available. ? ?Imaging: ?XR HIP UNILAT W OR W/O PELVIS 2-3 VIEWS LEFT ? ?Result Date: 04/26/2021 ?An AP pelvis and lateral of the left hip shows severe end-stage arthritis of the left hip.  There is  significant superior lateral joint space narrowing and large periarticular osteophytes around the hip joint.  The right hip appears normal.  ? ? ?PMFS History: ?Patient Active Problem List  ? Diagnosis Date Noted  ? Unilateral primary osteoarthritis, 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  ? Positive QuantiFERON-TB Gold test 02/21/2021  ? Arthritis of left hip 10/19/2020  ? Chronic left hip pain 10/19/2020  ? Pure hypercholesterolemia 03/10/2020  ? Obstructive sleep apnea syndrome 03/10/2020  ? Insomnia 03/10/2020  ? Hypertensive heart disease without congestive heart failure 03/10/2020  ? Essential tremor 03/10/2020  ? Essential hypertension 03/10/2020  ? Elevated PSA 03/10/2020  ? Type 2 diabetes mellitus without complications ([place]) 66/59/9357  ? Benign prostatic  hyperplasia with lower urinary tract symptoms 10/15/2018  ? Shoulder arthritis 10/24/2011  ? ?Past Medical History:  ?Diagnosis Date  ? Anxiety   ? Diabetes mellitus   ? Enlarged heart   ? Hyperlipemia   ? Hypertension   ? Sleep apnea   ? on c-pap  ? Tubular adenoma of colon   ? 2011  ?  ?Family History  ?Problem Relation Age of Onset  ? Cancer Mother   ? Heart disease Father   ? Cancer Son   ?  ?Past Surgical History:  ?Procedure Laterality Date  ? CATARACT EXTRACTION  2019  ? TOTAL SHOULDER ARTHROPLASTY  10/22/2011  ? Procedure: TOTAL SHOULDER ARTHROPLASTY;  Surgeon: Nita Sells, MD;  Location: [facility];  Service: Orthopedics;  Laterality: Right;  right total shoulder arthroplasty  ? ?Social History  ? ?Occupational History  ? Occupation: Retired  ?Tobacco Use  ? Smoking status: Never  ? Smokeless tobacco: Never  ?Vaping Use  ? Vaping Use: Never used  ?Substance and Sexual Activity  ? Alcohol use: Yes  ?  Comment: 1 drink a month  ? Drug use: No  ? Sexual activity: Not Currently  ? ? ? ? ? ? ?

## 2021-05-09 ENCOUNTER — Telehealth: Payer: Self-pay

## 2021-05-09 DIAGNOSIS — C44729 Squamous cell carcinoma of skin of left lower limb, including hip: Secondary | ICD-10-CM | POA: Diagnosis not present

## 2021-05-09 DIAGNOSIS — L821 Other seborrheic keratosis: Secondary | ICD-10-CM | POA: Diagnosis not present

## 2021-05-09 DIAGNOSIS — D225 Melanocytic nevi of trunk: Secondary | ICD-10-CM | POA: Diagnosis not present

## 2021-05-09 DIAGNOSIS — D2272 Melanocytic nevi of left lower limb, including hip: Secondary | ICD-10-CM | POA: Diagnosis not present

## 2021-05-09 DIAGNOSIS — L814 Other melanin hyperpigmentation: Secondary | ICD-10-CM | POA: Diagnosis not present

## 2021-05-09 DIAGNOSIS — L570 Actinic keratosis: Secondary | ICD-10-CM | POA: Diagnosis not present

## 2021-05-09 DIAGNOSIS — D2271 Melanocytic nevi of right lower limb, including hip: Secondary | ICD-10-CM | POA: Diagnosis not present

## 2021-05-09 DIAGNOSIS — Z85828 Personal history of other malignant neoplasm of skin: Secondary | ICD-10-CM | POA: Diagnosis not present

## 2021-05-09 DIAGNOSIS — D485 Neoplasm of uncertain behavior of skin: Secondary | ICD-10-CM | POA: Diagnosis not present

## 2021-05-09 NOTE — Telephone Encounter (Signed)
MEDICATION: finasteride (PROSCAR) 5 MG tablet ? ?PHARMACY:  ?[place], [place] - [address] Phone:  [phone]  ?Fax:  [phone]  ?  ? ? ?Comments: Please call the patient once its been refilled. ? ? ? ? ?**Let patient know to contact pharmacy at the end of the day to make sure medication is ready. ** ? ?** Please notify patient to allow 48-72 hours to process** ? ?**Encourage patient to contact the pharmacy for refills or they can request refills through [facility]** ? ?

## 2021-05-10 ENCOUNTER — Other Ambulatory Visit: Payer: Self-pay

## 2021-05-10 MED ORDER — FINASTERIDE 5 MG PO TABS: 5.0000 mg | ORAL_TABLET | Freq: Every day | ORAL | 1 refills | Status: AC

## 2021-05-10 NOTE — Telephone Encounter (Signed)
Rx sent in

## 2021-06-01 ENCOUNTER — Telehealth: Payer: Self-pay | Admitting: Physician Assistant

## 2021-06-01 NOTE — Telephone Encounter (Signed)
Pt states he received a msg that his current medication has been recalled and would like Allwardt's advice on what to do.  ? ?MEDICATION:finasteride (PROSCAR) 5 MG tablet ?

## 2021-06-01 NOTE — Telephone Encounter (Signed)
Lvm for the pt to call the office back. 

## 2021-06-07 ENCOUNTER — Telehealth: Payer: Self-pay | Admitting: Orthopaedic Surgery

## 2021-06-07 NOTE — Telephone Encounter (Signed)
Pt

## 2021-06-11 ENCOUNTER — Other Ambulatory Visit: Payer: Self-pay

## 2021-06-11 ENCOUNTER — Telehealth: Payer: Self-pay | Admitting: Physician Assistant

## 2021-06-11 MED ORDER — METFORMIN HCL 500 MG PO TABS
1000.0000 mg | ORAL_TABLET | Freq: Two times a day (BID) | ORAL | 1 refills | Status: DC
Start: 2021-06-11 — End: 2021-10-15

## 2021-06-11 NOTE — Telephone Encounter (Signed)
Please make sure it gets sent to [facility] and not [facility] ? ?.. ?Encourage patient to contact the pharmacy for refills or they can request refills through [facility] ? ?LAST APPOINTMENT DATE:  03/15/21 ? ? ?NEXT APPOINTMENT DATE: 06/14/21 ? ?MEDICATION:metFORMIN (GLUCOPHAGE) 500 MG tablet ? ?Is the patient out of medication? yes ? ?PHARMACY: ?[place], [place] - [address] Phone:  [phone]  ?Fax:  [phone]  ?  ? ? ?Let patient know to contact pharmacy at the end of the day to make sure medication is ready. ? ?Please notify patient to allow 48-72 hours to process  ?

## 2021-06-11 NOTE — Telephone Encounter (Signed)
Sent to pharmacy requested below. ?

## 2021-06-14 ENCOUNTER — Ambulatory Visit (INDEPENDENT_AMBULATORY_CARE_PROVIDER_SITE_OTHER): Payer: Medicare HMO | Admitting: Physician Assistant

## 2021-06-14 ENCOUNTER — Telehealth: Payer: Self-pay | Admitting: Orthopaedic Surgery

## 2021-06-14 ENCOUNTER — Ambulatory Visit: Payer: Medicare HMO | Admitting: Physician Assistant

## 2021-06-14 ENCOUNTER — Encounter: Payer: Self-pay | Admitting: Physician Assistant

## 2021-06-14 VITALS — BP 115/71 | HR 73 | Temp 98.0°F | Ht 66.0 in | Wt 180.6 lb

## 2021-06-14 DIAGNOSIS — E1159 Type 2 diabetes mellitus with other circulatory complications: Secondary | ICD-10-CM | POA: Diagnosis not present

## 2021-06-14 DIAGNOSIS — N5201 Erectile dysfunction due to arterial insufficiency: Secondary | ICD-10-CM | POA: Diagnosis not present

## 2021-06-14 DIAGNOSIS — N401 Enlarged prostate with lower urinary tract symptoms: Secondary | ICD-10-CM

## 2021-06-14 DIAGNOSIS — I10 Essential (primary) hypertension: Secondary | ICD-10-CM

## 2021-06-14 DIAGNOSIS — E119 Type 2 diabetes mellitus without complications: Secondary | ICD-10-CM

## 2021-06-14 DIAGNOSIS — R3912 Poor urinary stream: Secondary | ICD-10-CM | POA: Diagnosis not present

## 2021-06-14 LAB — POCT GLYCOSYLATED HEMOGLOBIN (HGB A1C): Hemoglobin A1C: 6.3 % — AB (ref 4.0–5.6)

## 2021-06-14 MED ORDER — ATORVASTATIN CALCIUM 40 MG PO TABS: 40.0000 mg | ORAL_TABLET | Freq: Every day | ORAL | 3 refills | Status: DC

## 2021-06-14 NOTE — Assessment & Plan Note (Signed)
A1c has improved.  Point-of-care level is 6.3 compared to 7.0 at last visit.  He will continue on metformin 1000 mg twice daily.  He will continue to work on good dietary habits.  He does not check his sugars at home.  He is not on insulin. ?

## 2021-06-14 NOTE — Patient Instructions (Signed)
Great to see you today! ?Ha1c is 6.3 - doing great! ?Blood pressure looks great! ?Keep everything on this track. ? ?Call if any concerns.  ?

## 2021-06-14 NOTE — Assessment & Plan Note (Signed)
Patient has concerns about finasteride 5 mg daily and sexual side effects.  He is going to follow-up with [facility] this afternoon for further discussion on this. ?

## 2021-06-14 NOTE — Assessment & Plan Note (Addendum)
Blood pressure is stable and at goal.  He is currently taking amlodipine 5 mg daily and spironolactone 25 mg.  We will continue on this dose.  Continue to monitor at home. ?

## 2021-06-14 NOTE — Progress Notes (Signed)
? ?Subjective:  ? ? Patient ID: Danny Hunter, male    DOB: 15-May-1940, 81 y.o.   MRN: [id number] ? ?Chief Complaint  ?Patient presents with  ? Benign Prostatic Hypertrophy  ?  Pt would like to discuss Finasteride; He says it has too many side effects.  ?  ? ? ?Patient is in today for regular f/up.  Patient states that there have been no changes to his medications or medical history since last visit. He sold his home and is living in retirement community with his wife. He has made friends and enjoying his time there. Limited in walking by hip pain, otherwise trying to do best he can with staying active and limiting overall calories. No other complaints or concerns today. See A/P for details.  ? ?Past Medical History:  ?Diagnosis Date  ? Anxiety   ? Diabetes mellitus   ? Enlarged heart   ? Hyperlipemia   ? Hypertension   ? Sleep apnea   ? on c-pap  ? Tubular adenoma of colon   ? 2011  ? ? ?Past Surgical History:  ?Procedure Laterality Date  ? CATARACT EXTRACTION  2019  ? TOTAL SHOULDER ARTHROPLASTY  10/22/2011  ? Procedure: TOTAL SHOULDER ARTHROPLASTY;  Surgeon: Nita Sells, MD;  Location: [facility];  Service: Orthopedics;  Laterality: Right;  right total shoulder arthroplasty  ? ? ?Family History  ?Problem Relation Age of Onset  ? Cancer Mother   ? Heart disease Father   ? Cancer Son   ? ? ?Social History  ? ?Tobacco Use  ? Smoking status: Never  ? Smokeless tobacco: Never  ?Vaping Use  ? Vaping Use: Never used  ?Substance Use Topics  ? Alcohol use: Yes  ?  Comment: 1 drink a month  ? Drug use: No  ?  ? ?Allergies  ?Allergen Reactions  ? Penicillin G Other (See Comments)  ? ? ?Review of Systems ?NEGATIVE UNLESS OTHERWISE INDICATED IN HPI ? ? ?   ?Objective:  ?  ? ?BP 115/71 (BP Location: Left Arm, Patient Position: Sitting, Cuff Size: Normal)   Pulse 73   Temp 98 ?F (36.7 ?C) (Temporal)   Ht '5\' 6"'$  (1.676 m)   Wt 180 lb 9.6 oz (81.9 kg)   SpO2 95%   BMI 29.15 kg/m?  ? ?Wt Readings from Last 3  Encounters:  ?06/14/21 180 lb 9.6 oz (81.9 kg)  ?04/26/21 175 lb (79.4 kg)  ?04/16/21 178 lb 12.8 oz (81.1 kg)  ? ? ?BP Readings from Last 3 Encounters:  ?06/14/21 115/71  ?04/16/21 122/76  ?03/15/21 121/79  ?  ? ?Physical Exam ?Vitals and nursing note reviewed.  ?Constitutional:   ?   Appearance: Normal appearance.  ?HENT:  ?   Head: Normocephalic.  ?   Nose: Nose normal.  ?Eyes:  ?   Extraocular Movements: Extraocular movements intact.  ?   Pupils: Pupils are equal, round, and reactive to light.  ?Cardiovascular:  ?   Rate and Rhythm: Normal rate and regular rhythm.  ?   Pulses: Normal pulses.  ?   Heart sounds: Normal heart sounds. No murmur heard. ?Pulmonary:  ?   Effort: Pulmonary effort is normal. No respiratory distress.  ?   Breath sounds: No wheezing.  ?Neurological:  ?   General: No focal deficit present.  ?   Mental Status: He is alert and oriented to person, place, and time.  ?   Motor: No weakness.  ?Psychiatric:     ?  Mood and Affect: Mood normal.     ?   Behavior: Behavior normal.     ?   Thought Content: Thought content normal.     ?   Judgment: Judgment normal.  ? ? ?   ?Assessment & Plan:  ? ?Problem List Items Addressed This Visit   ? ?  ? Cardiovascular and Mediastinum  ? Hypertension associated with diabetes ([place])  ?  Blood pressure is stable and at goal.  He is currently taking amlodipine 5 mg daily and spironolactone 25 mg.  We will continue on this dose.  Continue to monitor at home. ? ?  ?  ? Relevant Medications  ? atorvastatin (LIPITOR) 40 MG tablet  ?  ? Endocrine  ? Controlled type 2 diabetes mellitus without complication, without long-term current use of insulin ([place]) - Primary  ?  A1c has improved.  Point-of-care level is 6.3 compared to 7.0 at last visit.  He will continue on metformin 1000 mg twice daily.  He will continue to work on good dietary habits.  He does not check his sugars at home.  He is not on insulin. ? ?  ?  ? Relevant Medications  ? atorvastatin (LIPITOR) 40 MG  tablet  ? Other Relevant Orders  ? POCT HgB A1C (Completed)  ?  ? Genitourinary  ? Benign prostatic hyperplasia with lower urinary tract symptoms  ?  Patient has concerns about finasteride 5 mg daily and sexual side effects.  He is going to [facility]-up with [facility] this afternoon for further discussion on this. ? ?  ?  ? ? ? ?Meds ordered this encounter  ?Medications  ? atorvastatin (LIPITOR) 40 MG tablet  ?  Sig: Take 1 tablet (40 mg total) by mouth daily.  ?  Dispense:  90 tablet  ?  Refill:  3  ?  new md  ?  Order Specific Question:   Supervising Provider  ?  Answer:   Marin Olp [[id number]]  ? ? ?This note was prepared with assistance of Systems analyst. Occasional wrong-word or sound-a-like substitutions may have occurred due to the inherent limitations of voice recognition software. ? ? ?Lizbet Cirrincione M Lilybeth Vien, PA-C ?

## 2021-06-14 NOTE — Telephone Encounter (Signed)
Patient requesting xrays-Please copy 04/26/21 xrays to CD. Patient will pick up, please call when ready [phone] ?

## 2021-06-14 NOTE — Telephone Encounter (Signed)
Patient aware CD is ready for pickup at front desk.  

## 2021-06-25 DIAGNOSIS — M1612 Unilateral primary osteoarthritis, left hip: Secondary | ICD-10-CM | POA: Diagnosis not present

## 2021-06-25 DIAGNOSIS — M25552 Pain in left hip: Secondary | ICD-10-CM | POA: Diagnosis not present

## 2021-06-27 ENCOUNTER — Telehealth: Payer: Self-pay | Admitting: Physician Assistant

## 2021-06-27 MED ORDER — SPIRONOLACTONE 25 MG PO TABS: 25.0000 mg | ORAL_TABLET | Freq: Every day | ORAL | 1 refills | Status: DC

## 2021-06-27 NOTE — Telephone Encounter (Signed)
Rx refill sent to pharmacy requested

## 2021-06-27 NOTE — Telephone Encounter (Signed)
.   Encourage patient to contact the pharmacy for refills or they can request refills through [facility]:  06/14/21  NEXT APPOINTMENT DATE: 12/20/21  MEDICATION:spironolactone (ALDACTONE) 25 MG tablet  Is the patient out of medication?   PHARMACY: [facility] [address], [place] - [address] Phone:  [phone]  Fax:  [phone]      Let patient know to contact pharmacy at the end of the day to make sure medication is ready.  Please notify patient to allow 48-72 hours to process

## 2021-07-31 ENCOUNTER — Other Ambulatory Visit: Payer: Self-pay | Admitting: *Deleted

## 2021-07-31 ENCOUNTER — Telehealth: Payer: Self-pay | Admitting: Physician Assistant

## 2021-07-31 MED ORDER — AMLODIPINE BESYLATE 5 MG PO TABS: 5.0000 mg | ORAL_TABLET | Freq: Every day | ORAL | 1 refills | Status: DC

## 2021-07-31 NOTE — Telephone Encounter (Signed)
Rx send to [facility]  

## 2021-07-31 NOTE — Telephone Encounter (Signed)
..   Encourage patient to contact the pharmacy for refills or they can request refills through [facility]  LAST APPOINTMENT DATE:  Please schedule appointment if longer than 1 year  NEXT APPOINTMENT DATE:12/20/21   MEDICATION:amLODipine (NORVASC) 5 MG tablet  Is the patient out of medication?  Yes   PHARMACY[facility]   Let patient know to contact pharmacy at the end of the day to make sure medication is ready.  Please notify patient to allow 48-72 hours to process

## 2021-08-16 DIAGNOSIS — M1612 Unilateral primary osteoarthritis, left hip: Secondary | ICD-10-CM | POA: Insufficient documentation

## 2021-08-22 NOTE — Patient Instructions (Addendum)
DUE TO SPACE LIMITATIONS, ONLY TWO VISITORS  (aged 82 and older) ARE ALLOWED TO COME WITH YOU AND STAY IN THE WAITING ROOM DURING YOUR PRE OP AND PROCEDURE.   **NO VISITORS ARE ALLOWED IN THE SHORT STAY AREA OR RECOVERY ROOM!!**  IF YOU WILL BE ADMITTED INTO THE HOSPITAL YOU ARE ALLOWED ONLY FOUR SUPPORT PEOPLE DURING VISITATION HOURS (7 AM -8PM)   The support person(s) must pass our screening, and use Hand sanitizing gel. Visitors GUEST BADGE MUST BE WORN VISIBLY  One adult visitor may remain with you overnight and MUST be in the room by 8 P.M.   You are not required to quarantine at this time prior to your surgery. However, you must do this: Hand Hygiene often Do NOT share personal items Notify your provider if you are in close contact with someone who has COVID or you develop fever 100.4 or greater, new onset of sneezing, cough, sore throat, shortness of breath or body aches.       Your procedure is scheduled on:  Tuesday  September 04, 2021  Report to [facility] Main Entrance.  Report to admitting at:  09:00   AM  +++++Call this number if you have any questions or problems the morning of surgery [phone]  Do not eat food :After Midnight the night prior to your surgery/procedure.  After Midnight you may have the following liquids until  08:30 AM DAY OF SURGERY  Clear Liquid Diet Water Black Coffee (sugar ok, NO MILK/CREAM OR CREAMERS)  Tea (sugar ok, NO MILK/CREAM OR CREAMERS) regular and decaf                             Plain Jell-O (NO RED)                                           Fruit ices (not with fruit pulp, NO RED)                                     Popsicles (NO RED)                                                                  Juice: apple, WHITE grape, WHITE cranberry Sports drinks like Gatorade (NO RED)                    The day of surgery:  Drink ONE (1) Pre-Surgery G2 at   08:30  AM the morning of surgery. Drink in one sitting. Do not sip.   This drink was given to you during your hospital  pre-op appointment visit. Nothing else to drink after completing the Pre-Surgery G2.   If you have questions, please contact your surgeon's office.   FOLLOW  ANY ADDITIONAL PRE OP INSTRUCTIONS YOU RECEIVED FROM YOUR SURGEON'S OFFICE!!!   Oral Hygiene is also important to reduce your risk of infection.        Remember - BRUSH YOUR TEETH THE MORNING OF SURGERY WITH YOUR REGULAR TOOTHPASTE  Take ONLY these medicines the morning of surgery with A SIP OF WATER: Amlodipine, Finasteride (Proscar), sildosin (Rapaflo)    How to Manage Your Diabetes Before and After Surgery  Why is it important to control my blood sugar before and after surgery? Improving blood sugar levels before and after surgery helps healing and can limit problems. A way of improving blood sugar control is eating a healthy diet by:  Eating less sugar and carbohydrates  Increasing activity/exercise  Talking with your doctor about reaching your blood sugar goals High blood sugars (greater than 180 mg/dL) can raise your risk of infections and slow your recovery, so you will need to focus on controlling your diabetes during the weeks before surgery. Make sure that the doctor who takes care of your diabetes knows about your planned surgery including the date and location.  How do I manage my blood sugar before surgery? Check your blood sugar at least 4 times a day, starting 2 days before surgery, to make sure that the level is not too high or low. Check your blood sugar the morning of your surgery when you wake up and every 2 hours until you get to the Short Stay unit. If your blood sugar is less than 70 mg/dL, you will need to treat for low blood sugar: Do not take insulin. Treat a low blood sugar (less than 70 mg/dL) with  cup of clear juice (cranberry or apple), 4 glucose tablets, OR glucose gel. Recheck blood sugar in 15 minutes after treatment (to make sure it is greater  than 70 mg/dL). If your blood sugar is not greater than 70 mg/dL on recheck, call [phone] for further instructions. Report your blood sugar to the short stay nurse when you get to Short Stay.  If you are admitted to the hospital after surgery: Your blood sugar will be checked by the staff and you will probably be given insulin after surgery (instead of oral diabetes medicines) to make sure you have good blood sugar levels. The goal for blood sugar control after surgery is 80-180 mg/dL.   WHAT DO I DO ABOUT MY DIABETES MEDICATION?  THE DAY BEFORE SURGERY, Take your METFORMIN as prescribed.      THE MORNING OF SURGERY, DO NOT TAKE METFORMIN  You may not have any metal on your body including jewelry, and body piercing  Do not wear make-up, lotions, powders, perfumes or deodorant  Men may shave face and neck.   You may bring a small overnight bag with you on the day of surgery, only pack items that are not valuable .[facility] IS NOT RESPONSIBLE   FOR VALUABLES THAT ARE LOST OR STOLEN.   DO NOT [place]. PHARMACY WILL DISPENSE MEDICATIONS LISTED ON YOUR MEDICATION LIST TO YOU DURING YOUR ADMISSION [place]!   Special Instructions: Bring a copy of your healthcare power of attorney and living will documents the day of surgery, if you wish to have them scanned into your [facility] Medical Records- EPIC  Please read over the following fact sheets you were given: IF YOU HAVE QUESTIONS ABOUT YOUR PRE-OP INSTRUCTIONS, PLEASE CALL [phone]  ([place])   [facility] - Preparing for Surgery Before surgery, you can play an important role.  Because skin is not sterile, your skin needs to be as free of germs as possible.  You can reduce the number of germs on your skin by washing with CHG (chlorahexidine gluconate) soap before surgery.  CHG is an  antiseptic cleaner which kills germs and bonds with the skin to continue killing germs even after  washing. Please DO NOT use if you have an allergy to CHG or antibacterial soaps.  If your skin becomes reddened/irritated stop using the CHG and inform your nurse when you arrive at Short Stay. Do not shave (including legs and underarms) for at least 48 hours prior to the first CHG shower.  You may shave your face/neck.  Please follow these instructions carefully:  1.  Shower with CHG Soap the night before surgery and the  morning of surgery.  2.  If you choose to wash your hair, wash your hair first as usual with your normal  shampoo.  3.  After you shampoo, rinse your hair and body thoroughly to remove the shampoo.                             4.  Use CHG as you would any other liquid soap.  You can apply chg directly to the skin and wash.  Gently with a scrungie or clean washcloth.  5.  Apply the CHG Soap to your body ONLY FROM THE NECK DOWN.   Do not use on face/ open                           Wound or open sores. Avoid contact with eyes, ears mouth and genitals (private parts).                       Wash face,  Genitals (private parts) with your normal soap.             6.  Wash thoroughly, paying special attention to the area where your  surgery  will be performed.  7.  Thoroughly rinse your body with warm water from the neck down.  8.  DO NOT shower/wash with your normal soap after using and rinsing off the CHG Soap.            9.  Pat yourself dry with a clean towel.            10.  Wear clean pajamas.            11.  Place clean sheets on your bed the night of your first shower and do not  sleep with pets.  ON THE DAY OF SURGERY : Do not apply any lotions/deodorants the morning of surgery.  Please wear clean clothes to the hospital/surgery center.    FAILURE TO FOLLOW THESE INSTRUCTIONS MAY RESULT IN THE CANCELLATION OF YOUR SURGERY  PATIENT SIGNATURE_________________________________  NURSE  SIGNATURE__________________________________  ________________________________________________________________________    Adam Phenix    An incentive spirometer is a tool that can help keep your lungs clear and active. This tool measures how well you are filling your lungs with each breath. Taking long deep breaths may help reverse or decrease the chance of developing breathing (pulmonary) problems (especially infection) following: A long period of time when you are unable to move or be active. BEFORE THE PROCEDURE  If the spirometer includes an indicator to show your best effort, your nurse or respiratory therapist will set it to a desired goal. If possible, sit up straight or lean slightly forward. Try not to slouch. Hold the incentive spirometer in an upright position. INSTRUCTIONS FOR USE  Sit on the edge of your bed if possible, or  sit up as far as you can in bed or on a chair. Hold the incentive spirometer in an upright position. Breathe out normally. Place the mouthpiece in your mouth and seal your lips tightly around it. Breathe in slowly and as deeply as possible, raising the piston or the ball toward the top of the column. Hold your breath for 3-5 seconds or for as long as possible. Allow the piston or ball to fall to the bottom of the column. Remove the mouthpiece from your mouth and breathe out normally. Rest for a few seconds and repeat Steps 1 through 7 at least 10 times every 1-2 hours when you are awake. Take your time and take a few normal breaths between deep breaths. The spirometer may include an indicator to show your best effort. Use the indicator as a goal to work toward during each repetition. After each set of 10 deep breaths, practice coughing to be sure your lungs are clear. If you have an incision (the cut made at the time of surgery), support your incision when coughing by placing a pillow or rolled up towels firmly against it. Once you are able to get  out of bed, walk around indoors and cough well. You may stop using the incentive spirometer when instructed by your caregiver.  RISKS AND COMPLICATIONS Take your time so you do not get dizzy or light-headed. If you are in pain, you may need to take or ask for pain medication before doing incentive spirometry. It is harder to take a deep breath if you are having pain. AFTER USE Rest and breathe slowly and easily. It can be helpful to keep track of a log of your progress. Your caregiver can provide you with a simple table to help with this. If you are using the spirometer at home, follow these instructions: [place] IF:  You are having difficultly using the spirometer. You have trouble using the spirometer as often as instructed. Your pain medication is not giving enough relief while using the spirometer. You develop fever of 100.5 F (38.1 C) or higher.                                                                                                    SEEK IMMEDIATE MEDICAL CARE IF:  You cough up bloody sputum that had not been present before. You develop fever of 102 F (38.9 C) or greater. You develop worsening pain at or near the incision site. MAKE SURE YOU:  Understand these instructions. Will watch your condition. Will get help right away if you are not doing well or get worse. Document Released: 06/03/2006 Document Revised: 04/15/2011 Document Reviewed: 08/04/2006 [facility] Patient Information 2014 [place], [place].

## 2021-08-22 NOTE — Progress Notes (Addendum)
COVID Vaccine received:  '[]'$  No '[x]'$  Yes  Date of any COVID positive Test in last 90 days: none  PCP - Alyssa Allwardt, PA-C  Cardiologist - Gwyndolyn Kaufman, MD   Chest x-ray - 02-06-21  Epic EKG -  11-27-20  Epic Stress Test - none ECHO - none Cardiac Cath - none  Pacemaker/ICD device last checked: Date:       '[x]'$  N/A Spinal Cord Stimulator:'[]'$  No '[]'$  Yes   Other Implants:   History of Sleep Apnea? '[]'$  No '[x]'$  Yes  '[]'$  unknown Sleep Study Date:   CPAP used?- '[x]'$  No '[]'$  Yes  Doesn't use CPAP after losing weight       Does the patient monitor blood sugar? '[x]'$  No '[]'$  Yes  '[]'$  N/A Fasting Blood Sugar Ranges-  Checks Blood Sugar ___0__ times a day  Blood Thinner Instructions:  none Aspirin Instructions: Last Dose:  Activity level:  Can go up a flight of stairs and perform activities of daily living without stopping and without symptoms of chest pain or shortness of breath.'[]'$  No '[x]'$  Yes  '[]'$  N/A  Able to do exercises without symptoms '[]'$  No '[x]'$  Yes  '[]'$  N/A  Patient able to complete ADLs without assistance '[]'$  No '[x]'$  Yes  '[]'$  N/A    Comments: Hx of positive TB test. 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 Patient verbalized understanding and agreement to the Pre-Surgical Instructions that were given to them at this PAT appointment. Patient was also educated of the need to review these PAT instructions again prior to his/her surgery.I reviewed the appropriate phone numbers to call if they have any and questions or concerns.

## 2021-08-23 ENCOUNTER — Encounter (HOSPITAL_COMMUNITY): Payer: Self-pay

## 2021-08-23 ENCOUNTER — Other Ambulatory Visit: Payer: Self-pay

## 2021-08-23 ENCOUNTER — Encounter (HOSPITAL_COMMUNITY)
Admission: RE | Admit: 2021-08-23 | Discharge: 2021-08-23 | Disposition: A | Discharge status: Home / Self Care | Payer: Medicare HMO | Source: Ambulatory Visit | Attending: Orthopedic Surgery | Admitting: Orthopedic Surgery

## 2021-08-23 VITALS — BP 130/82 | Temp 98.4°F | Resp 16 | Ht 66.0 in | Wt 178.0 lb

## 2021-08-23 DIAGNOSIS — Z01818 Encounter for other preprocedural examination: Secondary | ICD-10-CM | POA: Diagnosis present

## 2021-08-23 DIAGNOSIS — M1612 Unilateral primary osteoarthritis, left hip: Secondary | ICD-10-CM | POA: Insufficient documentation

## 2021-08-23 DIAGNOSIS — E119 Type 2 diabetes mellitus without complications: Secondary | ICD-10-CM | POA: Diagnosis not present

## 2021-08-23 DIAGNOSIS — I10 Essential (primary) hypertension: Secondary | ICD-10-CM | POA: Insufficient documentation

## 2021-08-23 HISTORY — DX: Unspecified osteoarthritis, unspecified site: M19.90

## 2021-08-23 HISTORY — DX: Respiratory tuberculosis unspecified: A15.9

## 2021-08-23 HISTORY — DX: Malignant (primary) neoplasm, unspecified: C80.1

## 2021-08-23 LAB — CBC
HCT: 50.6 % (ref 39.0–52.0)
Hemoglobin: 16.3 g/dL (ref 13.0–17.0)
MCH: 31 pg (ref 26.0–34.0)
MCHC: 32.2 g/dL (ref 30.0–36.0)
MCV: 96.2 fL (ref 80.0–100.0)
Platelets: 253 10*3/uL (ref 150–400)
RBC: 5.26 MIL/uL (ref 4.22–5.81)
RDW: 13.2 % (ref 11.5–15.5)
WBC: 8.9 10*3/uL (ref 4.0–10.5)
nRBC: 0 % (ref 0.0–0.2)

## 2021-08-23 LAB — BASIC METABOLIC PANEL
Anion gap: 8 (ref 5–15)
BUN: 17 mg/dL (ref 8–23)
CO2: 26 mmol/L (ref 22–32)
Calcium: 10.5 mg/dL — ABNORMAL HIGH (ref 8.9–10.3)
Chloride: 103 mmol/L (ref 98–111)
Creatinine, Ser: 0.96 mg/dL (ref 0.61–1.24)
GFR, Estimated: >60 mL/min (ref >60)
Glucose, Bld: 116 mg/dL — ABNORMAL HIGH (ref 70–99)
Potassium: 5.4 mmol/L — ABNORMAL HIGH (ref 3.5–5.1)
Sodium: 137 mmol/L (ref 135–145)

## 2021-08-23 LAB — SURGICAL PCR SCREEN
MRSA, PCR: NEGATIVE
Staphylococcus aureus: POSITIVE — AB

## 2021-08-23 LAB — TYPE AND SCREEN
ABO/RH(D): O POS
Antibody Screen: NEGATIVE

## 2021-08-23 LAB — HEMOGLOBIN A1C
Hgb A1c MFr Bld: 6.1 % — ABNORMAL HIGH (ref 4.8–5.6)
Mean Plasma Glucose: 128.37 mg/dL

## 2021-08-23 LAB — GLUCOSE, CAPILLARY: Glucose-Capillary: 110 mg/dL — ABNORMAL HIGH (ref 70–99)

## 2021-08-24 NOTE — Progress Notes (Signed)
Patient's PCR screen is positive for STAPH. Appropriate notes have been placed on the patient's chart. This note has been routed to Dr. Alvan Dame for review.

## 2021-09-03 NOTE — H&P (Signed)
TOTAL HIP ADMISSION H&P  Patient is admitted for left total hip arthroplasty.  Subjective:  Chief Complaint: left hip pain  HPI: Danny Hunter, 81 y.o. male, has a history of pain and functional disability in the left hip(s) due to arthritis and patient has failed non-surgical conservative treatments for greater than 12 weeks to include NSAID's and/or analgesics and activity modification.  Onset of symptoms was gradual starting 2 years ago with gradually worsening course since that time.The patient noted no past surgery on the left hip(s).  Patient currently rates pain in the left hip at 8 out of 10 with activity. Patient has worsening of pain with activity and weight bearing, pain that interfers with activities of daily living, and pain with passive range of motion. Patient has evidence of joint space narrowing by imaging studies. This condition presents safety issues increasing the risk of falls.  There is no current active infection.  Patient Active Problem List   Diagnosis Date Noted   Unilateral primary osteoarthritis, 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   Positive QuantiFERON-TB Gold test 02/21/2021   Arthritis of left hip 10/19/2020   Chronic left hip pain 10/19/2020   Pure hypercholesterolemia 03/10/2020   Obstructive sleep apnea syndrome 03/10/2020   Insomnia 03/10/2020   Hypertensive heart disease without congestive heart failure 03/10/2020   Essential tremor 03/10/2020   Hypertension associated with diabetes ([place]) 03/10/2020   Elevated PSA 03/10/2020   Controlled type 2 diabetes mellitus without complication, without long-term current use of insulin ([place]) 03/10/2020   Benign prostatic hyperplasia with lower urinary tract symptoms 10/15/2018   Shoulder arthritis 10/24/2011   Past Medical History:  Diagnosis Date   Anxiety    Arthritis    Cancer (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   Diabetes mellitus    Enlarged heart    Hyperlipemia    Hypertension    Sleep apnea    No longer use  CPAP, lost weight   Tuberculosis    Latent TB, low risk no follow up   Tubular adenoma of colon    2011    Past Surgical History:  Procedure Laterality Date   CATARACT EXTRACTION  2019   SKIN CANCER EXCISION Right    ear   TOTAL SHOULDER ARTHROPLASTY  10/22/2011   Procedure: TOTAL SHOULDER ARTHROPLASTY;  Surgeon: Nita Sells, MD;  Location: [facility];  Service: Orthopedics;  Laterality: Right;  right total shoulder arthroplasty    No current facility-administered medications for this encounter.   Current Outpatient Medications  Medication Sig Dispense Refill Last Dose   amLODipine (NORVASC) 5 MG tablet Take 1 tablet (5 mg total) by mouth daily. 90 tablet 1    atorvastatin (LIPITOR) 40 MG tablet Take 1 tablet (40 mg total) by mouth daily. 90 tablet 3    finasteride (PROSCAR) 5 MG tablet Take 1 tablet (5 mg total) by mouth daily. 90 tablet 1    metFORMIN (GLUCOPHAGE) 500 MG tablet Take 2 tablets (1,000 mg total) by mouth 2 (two) times daily with a meal. (Patient taking differently: Take 500-1,000 mg by mouth See admin instructions. 1000 mg in the morning, 500 mg at bedtime) 360 tablet 1    silodosin (RAPAFLO) 8 MG CAPS capsule Take 8 mg by mouth daily.      spironolactone (ALDACTONE) 25 MG tablet Take 1 tablet (25 mg total) by mouth daily. 90 tablet 1    Allergies  Allergen Reactions   Penicillin G Other (See Comments)    Childhood Reaction  Social History   Tobacco Use   Smoking status: Never   Smokeless tobacco: Never  Substance Use Topics   Alcohol use: Yes    Comment: 1 drink a month    Family History  Problem Relation Age of Onset   Cancer Mother    Heart disease Father    Cancer Son      Review of Systems  Constitutional:  Negative for chills and fever.  Respiratory:  Negative for cough and shortness of breath.   Cardiovascular:  Negative for chest pain.  Gastrointestinal:  Negative for nausea and vomiting.  Musculoskeletal:  Positive for arthralgias.      Objective:  Physical Exam Well nourished and well developed. General: Alert and oriented x3, cooperative and pleasant, no acute distress. Head: normocephalic, atraumatic, neck supple. Eyes: EOMI.  Musculoskeletal: Left hip exam: Painful limited hip flexion internal rotation over 5 degrees of pelvic tilting with external rotation of 20 degrees Mild tenderness to palpation on the left hip Slight external rotation contracture with hip flexion actively with 5 -/5 strength due to pain  Calves soft and nontender. Motor function intact in LE. Strength 5/5 LE bilaterally. Neuro: Distal pulses 2+. Sensation to light touch intact in LE.  Vital signs in last 24 hours:    Labs:   Estimated body mass index is 28.73 kg/m as calculated from the following:   Height as of 08/23/21: '5\' 6"'$  (1.676 m).   Weight as of 08/23/21: 80.7 kg.   Imaging Review Plain radiographs demonstrate severe degenerative joint disease of the left hip(s). The bone quality appears to be adequate for age and reported activity level.      Assessment/Plan:  End stage arthritis, left hip(s)  The patient history, physical examination, clinical judgement of the provider and imaging studies are consistent with end stage degenerative joint disease of the left hip(s) and total hip arthroplasty is deemed medically necessary. The treatment options including medical management, injection therapy, arthroscopy and arthroplasty were discussed at length. The risks and benefits of total hip arthroplasty were presented and reviewed. The risks due to aseptic loosening, infection, stiffness, dislocation/subluxation,  thromboembolic complications and other imponderables were discussed.  The patient acknowledged the explanation, agreed to proceed with the plan and consent was signed. Patient is being admitted for inpatient treatment for surgery, pain control, PT, OT, prophylactic antibiotics, VTE prophylaxis, progressive ambulation  and ADL's and discharge planning.The patient is planning to be discharged  home.  Therapy Plans: HEP Disposition: Home at [place] DVT Prophylaxis: aspirin '81mg'$  BID DME needed: walker PCP: Alyssa Allwardt, PA-C - clearance received TXA: IV Allergies: NKDA Anesthesia Concerns: none BMI: 29.2 Last HgbA1c: 6.3%   Other: - hydrocodone, robaxin, tylenol   Costella Hatcher, PA-C Orthopedic Surgery [facility] [place] [phone]

## 2021-09-04 ENCOUNTER — Observation Stay (HOSPITAL_COMMUNITY): Payer: Medicare HMO

## 2021-09-04 ENCOUNTER — Encounter (HOSPITAL_COMMUNITY)
Admission: RE | Disposition: A | Discharge status: Home / Self Care | Payer: Self-pay | Source: Ambulatory Visit | Attending: Orthopedic Surgery

## 2021-09-04 ENCOUNTER — Ambulatory Visit (HOSPITAL_BASED_OUTPATIENT_CLINIC_OR_DEPARTMENT_OTHER): Payer: Medicare HMO | Admitting: Certified Registered"

## 2021-09-04 ENCOUNTER — Ambulatory Visit (HOSPITAL_COMMUNITY): Payer: Medicare HMO | Admitting: Certified Registered"

## 2021-09-04 ENCOUNTER — Other Ambulatory Visit: Payer: Self-pay

## 2021-09-04 ENCOUNTER — Observation Stay (HOSPITAL_COMMUNITY)
Admission: RE | Admit: 2021-09-04 | Discharge: 2021-09-06 | Disposition: A | Discharge status: Home / Self Care | Payer: Medicare HMO | Source: Ambulatory Visit | Attending: Orthopedic Surgery | Admitting: Orthopedic Surgery

## 2021-09-04 ENCOUNTER — Encounter (HOSPITAL_COMMUNITY): Payer: Self-pay | Admitting: Orthopedic Surgery

## 2021-09-04 ENCOUNTER — Ambulatory Visit (HOSPITAL_COMMUNITY): Payer: Medicare HMO

## 2021-09-04 DIAGNOSIS — Z96642 Presence of left artificial hip joint: Secondary | ICD-10-CM

## 2021-09-04 DIAGNOSIS — M1612 Unilateral primary osteoarthritis, left hip: Principal | ICD-10-CM | POA: Insufficient documentation

## 2021-09-04 DIAGNOSIS — Z471 Aftercare following joint replacement surgery: Secondary | ICD-10-CM | POA: Diagnosis not present

## 2021-09-04 DIAGNOSIS — I119 Hypertensive heart disease without heart failure: Secondary | ICD-10-CM | POA: Insufficient documentation

## 2021-09-04 DIAGNOSIS — E119 Type 2 diabetes mellitus without complications: Secondary | ICD-10-CM | POA: Insufficient documentation

## 2021-09-04 DIAGNOSIS — Z7984 Long term (current) use of oral hypoglycemic drugs: Secondary | ICD-10-CM | POA: Diagnosis not present

## 2021-09-04 DIAGNOSIS — Z85828 Personal history of other malignant neoplasm of skin: Secondary | ICD-10-CM | POA: Insufficient documentation

## 2021-09-04 DIAGNOSIS — Z79899 Other long term (current) drug therapy: Secondary | ICD-10-CM | POA: Insufficient documentation

## 2021-09-04 DIAGNOSIS — M25752 Osteophyte, left hip: Secondary | ICD-10-CM | POA: Diagnosis not present

## 2021-09-04 DIAGNOSIS — Z96611 Presence of right artificial shoulder joint: Secondary | ICD-10-CM | POA: Diagnosis not present

## 2021-09-04 DIAGNOSIS — Z96649 Presence of unspecified artificial hip joint: Secondary | ICD-10-CM

## 2021-09-04 HISTORY — PX: TOTAL HIP ARTHROPLASTY: SHX124

## 2021-09-04 LAB — GLUCOSE, CAPILLARY
Glucose-Capillary: 160 mg/dL — ABNORMAL HIGH (ref 70–99)
Glucose-Capillary: 109 mg/dL — ABNORMAL HIGH (ref 70–99)
Glucose-Capillary: 159 mg/dL — ABNORMAL HIGH (ref 70–99)
Glucose-Capillary: 190 mg/dL — ABNORMAL HIGH (ref 70–99)

## 2021-09-04 SURGERY — ARTHROPLASTY, HIP, TOTAL, ANTERIOR APPROACH
Anesthesia: Spinal | Site: Hip | Laterality: Left

## 2021-09-04 MED ORDER — METHOCARBAMOL 500 MG PO TABS
500.0000 mg | ORAL_TABLET | Freq: Four times a day (QID) | ORAL | Status: DC | PRN
  Administered 2021-09-04 – 2021-09-05 (×2): 500 mg via ORAL
  Filled 2021-09-04 (×2): qty 1

## 2021-09-04 MED ORDER — DEXAMETHASONE SODIUM PHOSPHATE 10 MG/ML IJ SOLN
INTRAMUSCULAR | Status: AC
  Filled 2021-09-04: qty 1

## 2021-09-04 MED ORDER — HYDROMORPHONE HCL 1 MG/ML IJ SOLN
INTRAMUSCULAR | Status: AC
  Filled 2021-09-04: qty 1

## 2021-09-04 MED ORDER — METOCLOPRAMIDE HCL 5 MG PO TABS: 5.0000 mg | ORAL_TABLET | Freq: Three times a day (TID) | ORAL | Status: DC | PRN

## 2021-09-04 MED ORDER — PROPOFOL 500 MG/50ML IV EMUL
INTRAVENOUS | Status: DC | PRN
  Administered 2021-09-04: 75 ug/kg/min via INTRAVENOUS

## 2021-09-04 MED ORDER — CHLORHEXIDINE GLUCONATE 0.12 % MT SOLN
15.0000 mL | Freq: Once | OROMUCOSAL | Status: AC
  Administered 2021-09-04: 15 mL via OROMUCOSAL

## 2021-09-04 MED ORDER — INSULIN ASPART 100 UNIT/ML IJ SOLN
0.0000 [IU] | Freq: Three times a day (TID) | INTRAMUSCULAR | Status: DC
  Administered 2021-09-04 – 2021-09-05 (×2): 3 [IU] via SUBCUTANEOUS
  Administered 2021-09-05 (×2): 2 [IU] via SUBCUTANEOUS
  Administered 2021-09-06: 3 [IU] via SUBCUTANEOUS

## 2021-09-04 MED ORDER — PHENYLEPHRINE HCL (PRESSORS) 10 MG/ML IV SOLN
INTRAVENOUS | Status: AC
  Filled 2021-09-04: qty 1

## 2021-09-04 MED ORDER — SODIUM CHLORIDE 0.9 % IV SOLN: INTRAVENOUS | Status: DC

## 2021-09-04 MED ORDER — STERILE WATER FOR IRRIGATION IR SOLN
Status: DC | PRN
  Administered 2021-09-04: 2000 mL

## 2021-09-04 MED ORDER — ORAL CARE MOUTH RINSE: 15.0000 mL | Freq: Once | OROMUCOSAL | Status: AC

## 2021-09-04 MED ORDER — DOCUSATE SODIUM 100 MG PO CAPS
100.0000 mg | ORAL_CAPSULE | Freq: Two times a day (BID) | ORAL | Status: DC
  Administered 2021-09-04 – 2021-09-06 (×4): 100 mg via ORAL
  Filled 2021-09-04 (×4): qty 1

## 2021-09-04 MED ORDER — HYDROCODONE-ACETAMINOPHEN 5-325 MG PO TABS
1.0000 | ORAL_TABLET | ORAL | Status: DC | PRN
  Administered 2021-09-04: 1 via ORAL
  Filled 2021-09-04: qty 1

## 2021-09-04 MED ORDER — TRANEXAMIC ACID-NACL 1000-0.7 MG/100ML-% IV SOLN
1000.0000 mg | INTRAVENOUS | Status: AC
  Administered 2021-09-04: 1000 mg via INTRAVENOUS
  Filled 2021-09-04: qty 100

## 2021-09-04 MED ORDER — METFORMIN HCL 500 MG PO TABS
1000.0000 mg | ORAL_TABLET | Freq: Every day | ORAL | Status: DC
  Administered 2021-09-05 – 2021-09-06 (×2): 1000 mg via ORAL
  Filled 2021-09-04 (×2): qty 2

## 2021-09-04 MED ORDER — HYDROMORPHONE HCL 1 MG/ML IJ SOLN
0.2500 mg | INTRAMUSCULAR | Status: DC | PRN
  Administered 2021-09-04 (×2): 0.25 mg via INTRAVENOUS

## 2021-09-04 MED ORDER — ONDANSETRON HCL 4 MG/2ML IJ SOLN
INTRAMUSCULAR | Status: DC | PRN
  Administered 2021-09-04: 4 mg via INTRAVENOUS

## 2021-09-04 MED ORDER — CEFAZOLIN SODIUM-DEXTROSE 2-4 GM/100ML-% IV SOLN
2.0000 g | Freq: Four times a day (QID) | INTRAVENOUS | Status: AC
  Administered 2021-09-04 (×2): 2 g via INTRAVENOUS
  Filled 2021-09-04 (×2): qty 100

## 2021-09-04 MED ORDER — BISACODYL 10 MG RE SUPP: 10.0000 mg | Freq: Every day | RECTAL | Status: DC | PRN

## 2021-09-04 MED ORDER — ACETAMINOPHEN 10 MG/ML IV SOLN
1000.0000 mg | Freq: Once | INTRAVENOUS | Status: AC
Start: 2021-09-04 — End: 2021-09-04
  Administered 2021-09-04: 1000 mg via INTRAVENOUS

## 2021-09-04 MED ORDER — METFORMIN HCL 500 MG PO TABS
500.0000 mg | ORAL_TABLET | Freq: Every day | ORAL | Status: DC
  Administered 2021-09-05: 500 mg via ORAL
  Filled 2021-09-04: qty 1

## 2021-09-04 MED ORDER — ATORVASTATIN CALCIUM 40 MG PO TABS
40.0000 mg | ORAL_TABLET | Freq: Every day | ORAL | Status: DC
  Administered 2021-09-04 – 2021-09-06 (×3): 40 mg via ORAL
  Filled 2021-09-04 (×3): qty 1

## 2021-09-04 MED ORDER — AMLODIPINE BESYLATE 5 MG PO TABS
5.0000 mg | ORAL_TABLET | Freq: Every day | ORAL | Status: DC
  Administered 2021-09-05 – 2021-09-06 (×2): 5 mg via ORAL
  Filled 2021-09-04 (×2): qty 1

## 2021-09-04 MED ORDER — LACTATED RINGERS IV SOLN: INTRAVENOUS | Status: DC

## 2021-09-04 MED ORDER — ONDANSETRON HCL 4 MG/2ML IJ SOLN
INTRAMUSCULAR | Status: AC
  Filled 2021-09-04: qty 2

## 2021-09-04 MED ORDER — DIPHENHYDRAMINE HCL 12.5 MG/5ML PO ELIX: 12.5000 mg | ORAL_SOLUTION | ORAL | Status: DC | PRN

## 2021-09-04 MED ORDER — TAMSULOSIN HCL 0.4 MG PO CAPS
0.4000 mg | ORAL_CAPSULE | Freq: Every day | ORAL | Status: DC
  Administered 2021-09-05: 0.4 mg via ORAL
  Filled 2021-09-04: qty 1

## 2021-09-04 MED ORDER — FENTANYL CITRATE PF 50 MCG/ML IJ SOSY
PREFILLED_SYRINGE | INTRAMUSCULAR | Status: AC
  Filled 2021-09-04: qty 1

## 2021-09-04 MED ORDER — POVIDONE-IODINE 10 % EX SWAB
2.0000 | Freq: Once | CUTANEOUS | Status: AC
  Administered 2021-09-04: 2 via TOPICAL

## 2021-09-04 MED ORDER — DEXAMETHASONE SODIUM PHOSPHATE 10 MG/ML IJ SOLN
10.0000 mg | Freq: Once | INTRAMUSCULAR | Status: AC
  Administered 2021-09-05: 10 mg via INTRAVENOUS
  Filled 2021-09-04: qty 1

## 2021-09-04 MED ORDER — BUPIVACAINE IN DEXTROSE 0.75-8.25 % IT SOLN
INTRATHECAL | Status: DC | PRN
  Administered 2021-09-04: 1.9 mL via INTRATHECAL

## 2021-09-04 MED ORDER — METHOCARBAMOL 500 MG IVPB - SIMPLE MED
INTRAVENOUS | Status: AC
  Filled 2021-09-04: qty 55

## 2021-09-04 MED ORDER — SPIRONOLACTONE 25 MG PO TABS
25.0000 mg | ORAL_TABLET | Freq: Every day | ORAL | Status: DC
  Administered 2021-09-05 – 2021-09-06 (×2): 25 mg via ORAL
  Filled 2021-09-04 (×2): qty 1

## 2021-09-04 MED ORDER — MENTHOL 3 MG MT LOZG
1.0000 | LOZENGE | OROMUCOSAL | Status: DC | PRN
Start: 2021-09-04 — End: 2021-09-06

## 2021-09-04 MED ORDER — PHENYLEPHRINE HCL-NACL 20-0.9 MG/250ML-% IV SOLN
INTRAVENOUS | Status: DC | PRN
  Administered 2021-09-04: 40 ug/min via INTRAVENOUS

## 2021-09-04 MED ORDER — MORPHINE SULFATE (PF) 2 MG/ML IV SOLN: 0.5000 mg | INTRAVENOUS | Status: DC | PRN

## 2021-09-04 MED ORDER — ASPIRIN 81 MG PO CHEW
81.0000 mg | CHEWABLE_TABLET | Freq: Two times a day (BID) | ORAL | Status: DC
  Administered 2021-09-05 – 2021-09-06 (×3): 81 mg via ORAL
  Filled 2021-09-04 (×3): qty 1

## 2021-09-04 MED ORDER — ACETAMINOPHEN 10 MG/ML IV SOLN
INTRAVENOUS | Status: AC
  Filled 2021-09-04: qty 100

## 2021-09-04 MED ORDER — PHENOL 1.4 % MT LIQD: 1.0000 | OROMUCOSAL | Status: DC | PRN

## 2021-09-04 MED ORDER — PHENYLEPHRINE 80 MCG/ML (10ML) SYRINGE FOR IV PUSH (FOR BLOOD PRESSURE SUPPORT)
PREFILLED_SYRINGE | INTRAVENOUS | Status: AC
  Filled 2021-09-04: qty 10

## 2021-09-04 MED ORDER — LIDOCAINE HCL (CARDIAC) PF 100 MG/5ML IV SOSY
PREFILLED_SYRINGE | INTRAVENOUS | Status: DC | PRN
  Administered 2021-09-04: 40 mg via INTRAVENOUS

## 2021-09-04 MED ORDER — POLYETHYLENE GLYCOL 3350 17 G PO PACK: 17.0000 g | PACK | Freq: Every day | ORAL | Status: DC | PRN

## 2021-09-04 MED ORDER — AMISULPRIDE (ANTIEMETIC) 5 MG/2ML IV SOLN: 10.0000 mg | Freq: Once | INTRAVENOUS | Status: DC | PRN

## 2021-09-04 MED ORDER — ONDANSETRON HCL 4 MG/2ML IJ SOLN: 4.0000 mg | Freq: Once | INTRAMUSCULAR | Status: DC | PRN

## 2021-09-04 MED ORDER — METOCLOPRAMIDE HCL 5 MG/ML IJ SOLN: 5.0000 mg | Freq: Three times a day (TID) | INTRAMUSCULAR | Status: DC | PRN

## 2021-09-04 MED ORDER — DEXAMETHASONE SODIUM PHOSPHATE 10 MG/ML IJ SOLN
8.0000 mg | Freq: Once | INTRAMUSCULAR | Status: AC
  Administered 2021-09-04: 8 mg via INTRAVENOUS

## 2021-09-04 MED ORDER — CEFAZOLIN SODIUM-DEXTROSE 2-4 GM/100ML-% IV SOLN
2.0000 g | INTRAVENOUS | Status: AC
  Administered 2021-09-04: 2 g via INTRAVENOUS
  Filled 2021-09-04: qty 100

## 2021-09-04 MED ORDER — INSULIN ASPART 100 UNIT/ML IJ SOLN: 0.0000 [IU] | Freq: Three times a day (TID) | INTRAMUSCULAR | Status: DC

## 2021-09-04 MED ORDER — 0.9 % SODIUM CHLORIDE (POUR BTL) OPTIME
TOPICAL | Status: DC | PRN
  Administered 2021-09-04: 1000 mL

## 2021-09-04 MED ORDER — ONDANSETRON HCL 4 MG/2ML IJ SOLN
4.0000 mg | Freq: Four times a day (QID) | INTRAMUSCULAR | Status: DC | PRN
  Administered 2021-09-05: 4 mg via INTRAVENOUS
  Filled 2021-09-04: qty 2

## 2021-09-04 MED ORDER — FERROUS SULFATE 325 (65 FE) MG PO TABS
325.0000 mg | ORAL_TABLET | Freq: Three times a day (TID) | ORAL | Status: DC
  Administered 2021-09-05 – 2021-09-06 (×2): 325 mg via ORAL
  Filled 2021-09-04 (×2): qty 1

## 2021-09-04 MED ORDER — HYDROCODONE-ACETAMINOPHEN 7.5-325 MG PO TABS
1.0000 | ORAL_TABLET | ORAL | Status: DC | PRN
  Administered 2021-09-04 – 2021-09-05 (×2): 2 via ORAL
  Administered 2021-09-05: 1 via ORAL
  Filled 2021-09-04: qty 2
  Filled 2021-09-04: qty 1
  Filled 2021-09-04: qty 2

## 2021-09-04 MED ORDER — TRANEXAMIC ACID-NACL 1000-0.7 MG/100ML-% IV SOLN
1000.0000 mg | Freq: Once | INTRAVENOUS | Status: AC
  Administered 2021-09-04: 1000 mg via INTRAVENOUS
  Filled 2021-09-04: qty 100

## 2021-09-04 MED ORDER — ONDANSETRON HCL 4 MG PO TABS
4.0000 mg | ORAL_TABLET | Freq: Four times a day (QID) | ORAL | Status: DC | PRN
  Administered 2021-09-04: 4 mg via ORAL
  Filled 2021-09-04: qty 1

## 2021-09-04 MED ORDER — METHOCARBAMOL 500 MG IVPB - SIMPLE MED
500.0000 mg | Freq: Four times a day (QID) | INTRAVENOUS | Status: DC | PRN
  Administered 2021-09-04: 500 mg via INTRAVENOUS

## 2021-09-04 MED ORDER — FINASTERIDE 5 MG PO TABS
5.0000 mg | ORAL_TABLET | Freq: Every day | ORAL | Status: DC
  Administered 2021-09-05 – 2021-09-06 (×2): 5 mg via ORAL
  Filled 2021-09-04 (×2): qty 1

## 2021-09-04 MED ORDER — METFORMIN HCL 500 MG PO TABS: 500.0000 mg | ORAL_TABLET | ORAL | Status: DC

## 2021-09-04 MED ORDER — FENTANYL CITRATE PF 50 MCG/ML IJ SOSY
25.0000 ug | PREFILLED_SYRINGE | INTRAMUSCULAR | Status: DC | PRN
  Administered 2021-09-04 (×3): 50 ug via INTRAVENOUS

## 2021-09-04 MED ORDER — PROPOFOL 10 MG/ML IV BOLUS
INTRAVENOUS | Status: AC
  Filled 2021-09-04: qty 20

## 2021-09-04 MED ORDER — ACETAMINOPHEN 325 MG PO TABS: 325.0000 mg | ORAL_TABLET | Freq: Four times a day (QID) | ORAL | Status: DC | PRN

## 2021-09-04 MED ORDER — PHENYLEPHRINE 80 MCG/ML (10ML) SYRINGE FOR IV PUSH (FOR BLOOD PRESSURE SUPPORT)
PREFILLED_SYRINGE | INTRAVENOUS | Status: DC | PRN
  Administered 2021-09-04: 80 ug via INTRAVENOUS
  Administered 2021-09-04: 160 ug via INTRAVENOUS

## 2021-09-04 SURGICAL SUPPLY — 43 items
BAG COUNTER SPONGE SURGICOUNT (BAG) IMPLANT
BAG DECANTER FOR FLEXI CONT (MISCELLANEOUS) IMPLANT
BAG ZIPLOCK 12X15 (MISCELLANEOUS) IMPLANT
BLADE SAG 18X100X1.27 (BLADE) ×2 IMPLANT
COVER PERINEAL POST (MISCELLANEOUS) ×2 IMPLANT
COVER SURGICAL LIGHT HANDLE (MISCELLANEOUS) ×2 IMPLANT
CUP ACETBLR 54 OD PINNACLE (Hips) ×1 IMPLANT
DERMABOND ADVANCED (GAUZE/BANDAGES/DRESSINGS) ×1
DERMABOND ADVANCED .7 DNX12 (GAUZE/BANDAGES/DRESSINGS) ×1 IMPLANT
DRAPE FOOT SWITCH (DRAPES) ×2 IMPLANT
DRAPE STERI IOBAN 125X83 (DRAPES) ×2 IMPLANT
DRAPE U-SHAPE 47X51 STRL (DRAPES) ×4 IMPLANT
DRESSING AQUACEL AG SP 3.5X10 (GAUZE/BANDAGES/DRESSINGS) ×1 IMPLANT
DRSG AQUACEL AG ADV 3.5X10 (GAUZE/BANDAGES/DRESSINGS) ×1 IMPLANT
DRSG AQUACEL AG SP 3.5X10 (GAUZE/BANDAGES/DRESSINGS) ×2
DURAPREP 26ML APPLICATOR (WOUND CARE) ×2 IMPLANT
ELECT REM PT RETURN 15FT ADLT (MISCELLANEOUS) ×2 IMPLANT
ELIMINATOR HOLE APEX DEPUY (Hips) ×1 IMPLANT
GLOVE BIO SURGEON STRL SZ 6 (GLOVE) ×2 IMPLANT
GLOVE BIOGEL PI IND STRL 6.5 (GLOVE) ×1 IMPLANT
GLOVE BIOGEL PI IND STRL 7.5 (GLOVE) ×1 IMPLANT
GLOVE BIOGEL PI INDICATOR 6.5 (GLOVE) ×1
GLOVE BIOGEL PI INDICATOR 7.5 (GLOVE) ×1
GLOVE ORTHO TXT STRL SZ7.5 (GLOVE) ×4 IMPLANT
GOWN STRL REUS W/ TWL LRG LVL3 (GOWN DISPOSABLE) ×3 IMPLANT
GOWN STRL REUS W/TWL LRG LVL3 (GOWN DISPOSABLE) ×6
HEAD M SROM 36MM PLUS 1.5 (Hips) IMPLANT
HOLDER FOLEY CATH W/STRAP (MISCELLANEOUS) ×2 IMPLANT
KIT TURNOVER KIT A (KITS) IMPLANT
LINER NEUTRAL 54X36MM PLUS 4 (Hips) ×1 IMPLANT
PACK ANTERIOR HIP CUSTOM (KITS) ×2 IMPLANT
SCREW 6.5MMX25MM (Screw) ×1 IMPLANT
SROM M HEAD 36MM PLUS 1.5 (Hips) ×2 IMPLANT
STEM FEM ACTIS HIGH SZ7 (Stem) ×1 IMPLANT
SUT MNCRL AB 4-0 PS2 18 (SUTURE) ×2 IMPLANT
SUT STRATAFIX 0 PDS 27 VIOLET (SUTURE) ×2
SUT VIC AB 1 CT1 36 (SUTURE) ×6 IMPLANT
SUT VIC AB 2-0 CT1 27 (SUTURE) ×4
SUT VIC AB 2-0 CT1 TAPERPNT 27 (SUTURE) ×2 IMPLANT
SUTURE STRATFX 0 PDS 27 VIOLET (SUTURE) ×1 IMPLANT
TRAY FOLEY MTR SLVR 16FR STAT (SET/KITS/TRAYS/PACK) IMPLANT
TUBE SUCTION HIGH CAP CLEAR NV (SUCTIONS) ×2 IMPLANT
WATER STERILE IRR 1000ML POUR (IV SOLUTION) ×2 IMPLANT

## 2021-09-04 NOTE — Anesthesia Preprocedure Evaluation (Addendum)
Anesthesia Evaluation  Patient identified by MRN, date of birth, ID band Patient awake    Reviewed: Allergy & Precautions, NPO status , Patient's Chart, lab work & pertinent test results  Airway Mallampati: II  TM Distance: >3 FB Neck ROM: Full    Dental no notable dental hx.    Pulmonary sleep apnea ,    Pulmonary exam normal        Cardiovascular hypertension, Pt. on medications Normal cardiovascular exam  ECG: NSR, rate 88   Neuro/Psych Anxiety negative neurological ROS     GI/Hepatic negative GI ROS, Neg liver ROS,   Endo/Other  diabetes, Oral Hypoglycemic Agents  Renal/GU negative Renal ROS     Musculoskeletal  (+) Arthritis ,   Abdominal   Peds  Hematology negative hematology ROS (+)   Anesthesia Other Findings Left hip osteoarthritis  Reproductive/Obstetrics                            Anesthesia Physical Anesthesia Plan  ASA: 2  Anesthesia Plan: Spinal   Post-op Pain Management:    Induction: Intravenous  PONV Risk Score and Plan: 1 and Ondansetron, Dexamethasone, Propofol infusion and Treatment may vary due to age or medical condition  Airway Management Planned: Simple Face Mask  Additional Equipment:   Intra-op Plan:   Post-operative Plan:   Informed Consent: I have reviewed the patients History and Physical, chart, labs and discussed the procedure including the risks, benefits and alternatives for the proposed anesthesia with the patient or authorized representative who has indicated his/her understanding and acceptance.     Dental advisory given  Plan Discussed with: CRNA  Anesthesia Plan Comments:         Anesthesia Quick Evaluation

## 2021-09-04 NOTE — Anesthesia Procedure Notes (Signed)
Spinal  Patient location during procedure: OR Start time: 09/04/2021 11:28 AM End time: 09/04/2021 11:32 AM Reason for block: surgical anesthesia Staffing Performed: resident/CRNA  Resident/CRNA: Pulliam, Elizabeth C, CRNA Performed by: Pulliam, Elizabeth C, CRNA Authorized by: Ellender, Ryan P, MD   Preanesthetic Checklist Completed: patient identified, IV checked, risks and benefits discussed, surgical consent, monitors and equipment checked, pre-op evaluation and timeout performed Spinal Block Patient position: sitting Prep: DuraPrep Patient monitoring: heart rate Approach: midline Location: L3-4 Injection technique: single-shot Needle Needle type: Pencan  Needle gauge: 24 G Needle length: 10 cm Additional Notes Expiration date of kit checked. Clear CSF flow prior to injection. Dr. Ellender present     

## 2021-09-04 NOTE — Interval H&P Note (Signed)
History and Physical Interval Note:  09/04/2021 10:03 AM  Danny Hunter  has presented today for surgery, with the diagnosis of Left hip osteoarthritis.  The various methods of treatment have been discussed with the patient and family. After consideration of risks, benefits and other options for treatment, the patient has consented to  Procedure(s): TOTAL HIP ARTHROPLASTY ANTERIOR APPROACH (Left) as a surgical intervention.  The patient's history has been reviewed, patient examined, no change in status, stable for surgery.  I have reviewed the patient's chart and labs.  Questions were answered to the patient's satisfaction.     [name]

## 2021-09-04 NOTE — Anesthesia Procedure Notes (Signed)
Procedure Name: MAC Date/Time: 09/04/2021 11:26 AM  Performed by: Niel Hummer, CRNAPre-anesthesia Checklist: Patient identified, Emergency Drugs available, Suction available and Patient being monitored Oxygen Delivery Method: Simple face mask

## 2021-09-04 NOTE — Discharge Instructions (Signed)

## 2021-09-04 NOTE — Op Note (Signed)
NAME:  Danny Hunter                ACCOUNT NO.: [id number]      MEDICAL RECORD NO.: [id number]      FACILITY:  [facility]      PHYSICIAN:  [name]  DATE OF BIRTH:  29-Jun-1940     DATE OF PROCEDURE:  09/04/2021                                 OPERATIVE REPORT         PREOPERATIVE DIAGNOSIS: Left  hip osteoarthritis.      POSTOPERATIVE DIAGNOSIS:  Left hip osteoarthritis.      PROCEDURE:  Left total hip replacement through an anterior approach   utilizing DePuy THR system, component size 54 mm pinnacle cup, a size 36+4 neutral   Altrex liner, a size 7 Hi Actis stem with a 36+1.5 Articuleze metal head ball.      SURGEON:  Pietro Cassis. Alvan Dame, M.D.      ASSISTANT:  surgical team     ANESTHESIA:  Spinal.      SPECIMENS:  None.      COMPLICATIONS:  None.      BLOOD LOSS:  600 cc     DRAINS:  None.      INDICATION OF THE PROCEDURE:  Danny Hunter is a 81 y.o. male who had   presented to office for evaluation of left hip pain.  Radiographs revealed   progressive degenerative changes with bone-on-bone   articulation of the  hip joint, including subchondral cystic changes and osteophytes.  The patient had painful limited range of   motion significantly affecting their overall quality of life and function.  The patient was failing to    respond to conservative measures including medications and/or injections and activity modification and at this point was ready   to proceed with more definitive measures.  Consent was obtained for   benefit of pain relief.  Specific risks of infection, DVT, component   failure, dislocation, neurovascular injury, and need for revision surgery were reviewed in the office.     PROCEDURE IN DETAIL:  The patient was brought to operative theater.   Once adequate anesthesia, preoperative antibiotics, 2 gm of Ancef, 1 gm of Tranexamic Acid, and 10 mg of Decadron were administered, the patient was positioned supine on the [employer] table.  Once the patient was safely positioned with adequate padding of boney prominences we predraped out the hip, and used fluoroscopy to confirm orientation of the pelvis.      The left hip was then prepped and draped from proximal iliac crest to   mid thigh with a shower curtain technique.      Time-out was performed identifying the patient, planned procedure, and the appropriate extremity.     An incision was then made 2 cm lateral to the   anterior superior iliac spine extending over the orientation of the   tensor fascia lata muscle and sharp dissection was carried down to the   fascia of the muscle.      The fascia was then incised.  The muscle belly was identified and swept   laterally and retractor placed along the superior neck.  Following   cauterization of the circumflex vessels and removing some pericapsular   fat, a second cobra retractor was placed on the inferior neck.  A T-capsulotomy was made along the line of the   superior neck to the trochanteric fossa, then extended proximally and   distally.  Tag sutures were placed and the retractors were then placed   intracapsular.  We then identified the trochanteric fossa and   orientation of my neck cut and then made a neck osteotomy with the femur on traction.  The femoral   head was removed without difficulty or complication.  Traction was let   off and retractors were placed posterior and anterior around the   acetabulum.      The labrum and foveal tissue were debrided.  I began reaming with a 48 mm   reamer and reamed up to 53 mm reamer with good bony bed preparation and a 54 mm  cup was chosen.  The final 54 mm Pinnacle cup was then impacted under fluoroscopy to confirm the depth of penetration and orientation with respect to   Abduction and forward flexion.  A screw was placed into the ilium followed by the hole eliminator.  The final   36+4 neutral Altrex liner was impacted with good visualized rim fit.  The cup  was positioned anatomically within the acetabular portion of the pelvis.      At this point, the femur was rolled to 100 degrees.  Further capsule was   released off the inferior aspect of the femoral neck.  I then   released the superior capsule proximally.  With the leg in a neutral position the hook was placed laterally   along the femur under the vastus lateralis origin and elevated manually and then held in position using the hook attachment on the bed.  The leg was then extended and adducted with the leg rolled to 100   degrees of external rotation.  Retractors were placed along the medial calcar and posteriorly over the greater trochanter.  Once the proximal femur was fully   exposed, I used a box osteotome to set orientation.  I then began   broaching with the starting chili pepper broach and passed this by hand and then broached up to 7.  With the 7 broach in place I chose a high offset neck and did several trial reductions.  The offset was appropriate, leg lengths   appeared to be equal best matched with the +1.5 head ball trial confirmed radiographically.   Given these findings, I went ahead and dislocated the hip, repositioned all   retractors and positioned the right hip in the extended and abducted position.  The final 7 Hi Actis stem was   chosen and it was impacted down to the level of neck cut.  Based on this   and the trial reductions, a final 36+1.5 Articuleze metal head ball was chosen and   impacted onto a clean and dry trunnion, and the hip was reduced.  The   hip had been irrigated throughout the case again at this point.  I did   reapproximate the superior capsular leaflet to the anterior leaflet   using #1 Vicryl.  The fascia of the   tensor fascia lata muscle was then reapproximated using #1 Vicryl and #0 Stratafix sutures.  The   remaining wound was closed with 2-0 Vicryl and running 4-0 Monocryl.   The hip was cleaned, dried, and dressed sterilely using Dermabond and    Aquacel dressing.  The patient was then brought   to recovery room in stable condition tolerating the procedure well.  Pietro Cassis Alvan Dame, M.D.        09/04/2021 1:00 PM

## 2021-09-04 NOTE — Anesthesia Postprocedure Evaluation (Signed)
Anesthesia Post Note  Patient: Danny Hunter  Procedure(s) Performed: TOTAL HIP ARTHROPLASTY ANTERIOR APPROACH (Left: Hip)     Patient location during evaluation: PACU Anesthesia Type: Spinal Level of consciousness: awake Pain management: pain level controlled Vital Signs Assessment: post-procedure vital signs reviewed and stable Respiratory status: spontaneous breathing, nonlabored ventilation, respiratory function stable and patient connected to nasal cannula oxygen Cardiovascular status: stable and blood pressure returned to baseline Postop Assessment: no apparent nausea or vomiting Anesthetic complications: no   No notable events documented.  Last Vitals:  Vitals:   09/04/21 1615 09/04/21 1637  BP: 124/78 130/76  Pulse: 62 68  Resp: 11 20  Temp:  36.9 C  SpO2: 100% 99%    Last Pain:  Vitals:   09/04/21 1943  TempSrc:   PainSc: 4                  Loreta Blouch P Tony Friscia

## 2021-09-04 NOTE — Evaluation (Signed)
Physical Therapy Evaluation Patient Details Name: Danny Hunter MRN: [id number] DOB: Jul 20, 1940 Today's Date: 09/04/2021  History of Present Illness  Pt is an 81yo male presenting s/p L-THA, AA on 09/04/21 PMH: anxiety, DM, HLD, HTN, R-TSA 2013, BPH  Clinical Impression  Danny Hunter is a 81 y.o. male POD 0 s/p L-THA,AA. Patient reports independence with mobility at baseline. Patient is now limited by functional impairments (see PT problem list below) and requires min assist for bed mobility and min guard for transfers, after performing step pivot transfer pt dizzy and nauseated, further mobility deferred, BP 142/76, RN notified. Patient instructed in exercise to facilitate ROM and circulation to manage edema. Provided incentive spirometer and with Vcs pt able to achieve 1251m. Patient will benefit from continued skilled PT interventions to address impairments and progress towards PLOF. Acute PT will follow to progress mobility and stair training in preparation for safe discharge home.       Recommendations for follow up therapy are one component of a multi-disciplinary discharge planning process, led by the attending physician.  Recommendations may be updated based on patient status, additional functional criteria and insurance authorization.  Follow Up Recommendations Follow physician's recommendations for discharge plan and follow up therapies      Assistance Recommended at Discharge Intermittent Supervision/Assistance  Patient can return home with the following  A little help with walking and/or transfers;A little help with bathing/dressing/bathroom;Assistance with cooking/housework;Assist for transportation;Help with stairs or ramp for entrance    Equipment Recommendations None recommended by PT;Rolling walker (2 wheels) (Pt reports his son has RW, but is unsure.)  Recommendations for Other Services       Functional Status Assessment Patient has had a recent decline in their  functional status and demonstrates the ability to make significant improvements in function in a reasonable and predictable amount of time.     Precautions / Restrictions Precautions Precautions: Fall Restrictions Weight Bearing Restrictions: No Other Position/Activity Restrictions: WBAT      Mobility  Bed Mobility Overal bed mobility: Needs Assistance Bed Mobility: Supine to Sit     Supine to sit: Min assist     General bed mobility comments: Min assist to bring RLE off bed    Transfers Overall transfer level: Needs assistance Equipment used: Rolling walker (2 wheels) Transfers: Sit to/from Stand, Bed to chair/wheelchair/BSC Sit to Stand: Min guard, From elevated surface   Step pivot transfers: Min guard       General transfer comment: Min guard for safety, no physical assist required or overt LOB noted, VCs for hand placement and sequencing.    Ambulation/Gait               General Gait Details: deferred, pt nauseated and dizzy  Stairs            Wheelchair Mobility    Modified Rankin (Stroke Patients Only)       Balance Overall balance assessment: Needs assistance Sitting-balance support: Feet supported, No upper extremity supported Sitting balance-Leahy Scale: Good     Standing balance support: Reliant on assistive device for balance, During functional activity, Bilateral upper extremity supported Standing balance-Leahy Scale: Poor                               Pertinent Vitals/Pain Pain Assessment Pain Assessment: 0-10 Pain Score: 5  Pain Location: left hip Pain Descriptors / Indicators: Burning, Operative site guarding Pain Intervention(s): Monitored during session, Limited activity  within patient's tolerance, Repositioned, Ice applied    Home Living Family/patient expects to be discharged to:: Assisted living                 Home Equipment: Conservation officer, nature (2 wheels) Additional Comments: Pt lives at assisted  living facility with his wife who has Parkinson's disease. Reports level entry, elevator, walk in shower, elevated toilet, can walk to cafeteria approximately 262f.    Prior Function Prior Level of Function : Independent/Modified Independent             Mobility Comments: ind ADLs Comments: ind     Hand Dominance   Dominant Hand: Left    Extremity/Trunk Assessment   Upper Extremity Assessment Upper Extremity Assessment: Overall WFL for tasks assessed    Lower Extremity Assessment Lower Extremity Assessment: RLE deficits/detail;LLE deficits/detail RLE Deficits / Details: MMT ank DF/PF 5/5 RLE Sensation: WNL LLE Deficits / Details: MMT ank DF/PF 5/5 LLE Sensation: WNL    Cervical / Trunk Assessment Cervical / Trunk Assessment: Normal  Communication   Communication: No difficulties  Cognition Arousal/Alertness: Awake/alert Behavior During Therapy: WFL for tasks assessed/performed Overall Cognitive Status: Within Functional Limits for tasks assessed                                          General Comments      Exercises Total Joint Exercises Ankle Circles/Pumps: AROM, Both, 10 reps   Assessment/Plan    PT Assessment Patient needs continued PT services  PT Problem List Decreased strength;Decreased range of motion;Decreased activity tolerance;Decreased balance;Decreased mobility;Decreased coordination;Decreased knowledge of use of DME;Pain       PT Treatment Interventions DME instruction;Functional mobility training;Therapeutic activities;Therapeutic exercise;Balance training;Neuromuscular re-education;Patient/family education;Gait training    PT Goals (Current goals can be found in the Care Plan section)  Acute Rehab PT Goals Patient Stated Goal: I'd like to get back to walking PT Goal Formulation: With patient Time For Goal Achievement: 09/11/21 Potential to Achieve Goals: Good    Frequency 7X/week     Co-evaluation                AM-PAC PT "6 Clicks" Mobility  Outcome Measure Help needed turning from your back to your side while in a flat bed without using bedrails?: None Help needed moving from lying on your back to sitting on the side of a flat bed without using bedrails?: A Little Help needed moving to and from a bed to a chair (including a wheelchair)?: A Little Help needed standing up from a chair using your arms (e.g., wheelchair or bedside chair)?: A Little Help needed to walk in hospital room?: A Little Help needed climbing 3-5 steps with a railing? : A Lot 6 Click Score: 18    End of Session Equipment Utilized During Treatment: Gait belt Activity Tolerance: Patient limited by pain Patient left: in chair;with call bell/phone within reach;with chair alarm set;with SCD's reapplied Nurse Communication: Mobility status PT Visit Diagnosis: Pain;Difficulty in walking, not elsewhere classified (R26.2) Pain - Right/Left: Left Pain - part of body: Hip    Time: 13016-0109PT Time Calculation (min) (ACUTE ONLY): 40 min   Charges:   PT Evaluation $PT Eval Low Complexity: 1 Low PT Treatments $Therapeutic Activity: 8-22 mins        WCoolidge Breeze PT, DPT [facility] Office: [phone]: [phone] WCoolidge Breeze8/02/2021, 7:08 PM

## 2021-09-04 NOTE — Transfer of Care (Signed)
Immediate Anesthesia Transfer of Care Note  Patient: Danny Hunter  Procedure(s) Performed: TOTAL HIP ARTHROPLASTY ANTERIOR APPROACH (Left: Hip)  Patient Location: PACU  Anesthesia Type:Spinal  Level of Consciousness: drowsy  Airway & Oxygen Therapy: Patient Spontanous Breathing and Patient connected to face mask oxygen  Post-op Assessment: Report given to RN and Post -op Vital signs reviewed and stable  Post vital signs: Reviewed and stable  Last Vitals:  Vitals Value Taken Time  BP 107/68   Temp    Pulse 65 09/04/21 1302  Resp 16 09/04/21 1302  SpO2 100 % 09/04/21 1302  Vitals shown include unvalidated device data.  Last Pain:  Vitals:   09/04/21 0923  TempSrc: Oral  PainSc:       Patients Stated Pain Goal: 5 (88/41/66 0630)  Complications: No notable events documented.

## 2021-09-04 NOTE — Plan of Care (Signed)
  Problem: Activity: Goal: Risk for activity intolerance will decrease Outcome: Progressing   Problem: Nutrition: Goal: Adequate nutrition will be maintained Outcome: Progressing   Problem: Education: Goal: Knowledge of the prescribed therapeutic regimen will improve Outcome: Progressing Goal: Understanding of discharge needs will improve Outcome: Progressing   Problem: Activity: Goal: Ability to avoid complications of mobility impairment will improve Outcome: Progressing Goal: Ability to tolerate increased activity will improve Outcome: Progressing   Problem: Clinical Measurements: Goal: Postoperative complications will be avoided or minimized Outcome: Progressing   Problem: Pain Management: Goal: Pain level will decrease with appropriate interventions Outcome: Progressing

## 2021-09-05 ENCOUNTER — Encounter (HOSPITAL_COMMUNITY): Payer: Self-pay | Admitting: Orthopedic Surgery

## 2021-09-05 DIAGNOSIS — M1612 Unilateral primary osteoarthritis, left hip: Secondary | ICD-10-CM | POA: Diagnosis not present

## 2021-09-05 DIAGNOSIS — Z7984 Long term (current) use of oral hypoglycemic drugs: Secondary | ICD-10-CM | POA: Diagnosis not present

## 2021-09-05 DIAGNOSIS — Z79899 Other long term (current) drug therapy: Secondary | ICD-10-CM | POA: Diagnosis not present

## 2021-09-05 DIAGNOSIS — I119 Hypertensive heart disease without heart failure: Secondary | ICD-10-CM | POA: Diagnosis not present

## 2021-09-05 DIAGNOSIS — Z96611 Presence of right artificial shoulder joint: Secondary | ICD-10-CM | POA: Diagnosis not present

## 2021-09-05 DIAGNOSIS — E119 Type 2 diabetes mellitus without complications: Secondary | ICD-10-CM | POA: Diagnosis not present

## 2021-09-05 DIAGNOSIS — Z85828 Personal history of other malignant neoplasm of skin: Secondary | ICD-10-CM | POA: Diagnosis not present

## 2021-09-05 LAB — CBC
HCT: 43.2 % (ref 39.0–52.0)
Hemoglobin: 14 g/dL (ref 13.0–17.0)
MCH: 31.2 pg (ref 26.0–34.0)
MCHC: 32.4 g/dL (ref 30.0–36.0)
MCV: 96.2 fL (ref 80.0–100.0)
Platelets: 215 10*3/uL (ref 150–400)
RBC: 4.49 MIL/uL (ref 4.22–5.81)
RDW: 12.9 % (ref 11.5–15.5)
WBC: 14.9 10*3/uL — ABNORMAL HIGH (ref 4.0–10.5)
nRBC: 0 % (ref 0.0–0.2)

## 2021-09-05 LAB — BASIC METABOLIC PANEL
Anion gap: 6 (ref 5–15)
BUN: 19 mg/dL (ref 8–23)
CO2: 26 mmol/L (ref 22–32)
Calcium: 9.5 mg/dL (ref 8.9–10.3)
Chloride: 106 mmol/L (ref 98–111)
Creatinine, Ser: 0.96 mg/dL (ref 0.61–1.24)
GFR, Estimated: >60 mL/min (ref >60)
Glucose, Bld: 175 mg/dL — ABNORMAL HIGH (ref 70–99)
Potassium: 4.3 mmol/L (ref 3.5–5.1)
Sodium: 138 mmol/L (ref 135–145)

## 2021-09-05 LAB — GLUCOSE, CAPILLARY
Glucose-Capillary: 124 mg/dL — ABNORMAL HIGH (ref 70–99)
Glucose-Capillary: 138 mg/dL — ABNORMAL HIGH (ref 70–99)
Glucose-Capillary: 177 mg/dL — ABNORMAL HIGH (ref 70–99)
Glucose-Capillary: 140 mg/dL — ABNORMAL HIGH (ref 70–99)

## 2021-09-05 MED ORDER — DOCUSATE SODIUM 100 MG PO CAPS: 100.0000 mg | ORAL_CAPSULE | Freq: Two times a day (BID) | ORAL | 0 refills | Status: DC

## 2021-09-05 MED ORDER — HYDROCODONE-ACETAMINOPHEN 5-325 MG PO TABS: 1.0000 | ORAL_TABLET | ORAL | 0 refills | Status: DC | PRN

## 2021-09-05 MED ORDER — ASPIRIN 81 MG PO CHEW: 81.0000 mg | CHEWABLE_TABLET | Freq: Two times a day (BID) | ORAL | 0 refills | Status: AC

## 2021-09-05 MED ORDER — METHOCARBAMOL 500 MG PO TABS: 500.0000 mg | ORAL_TABLET | Freq: Four times a day (QID) | ORAL | 0 refills | Status: DC | PRN

## 2021-09-05 MED ORDER — POLYETHYLENE GLYCOL 3350 17 G PO PACK: 17.0000 g | PACK | Freq: Every day | ORAL | 0 refills | Status: DC | PRN

## 2021-09-05 NOTE — TOC Transition Note (Signed)
Transition of Care [facility]) - CM/SW Discharge Note  Patient Details  Name: Danny Hunter MRN: [id number] Date of Birth: 1940-04-22  Transition of Care [facility] CM/SW Contact:  Sherie Don, LCSW Phone Number: 09/05/2021, 11:36 AM  Clinical Narrative: Patient is expected to discharge home after working with PT. CSW met with patient and son to confirm discharge plan and needs. Patient will go home with a home exercise program (HEP). Patient will need a rolling walker, which was delivered to patient's room by [employer]. TOC signing off.  Final next level of care: Home/Self Care Barriers to Discharge: No Barriers Identified  Patient Goals and CMS Choice Patient states their goals for this hospitalization and ongoing recovery are:: Discharge home with HEP CMS Medicare.gov Compare Post Acute Care list provided to:: Patient Choice offered to / list presented to : Patient  Discharge Plan and Services        DME Arranged: Walker rolling DME Agency: [employer] Representative spoke with at DME Agency: Prearranged in orthopedist's office  Readmission Risk Interventions     No data to display

## 2021-09-05 NOTE — Progress Notes (Signed)
Physical Therapy Treatment Patient Details Name: Danny Hunter MRN: [id number] DOB: [birth year] Today's Date: 09/05/2021   History of Present Illness Pt is an 81yo male presenting s/p L-THA, AA on 09/04/21 PMH: anxiety, DM, HLD, HTN, R-TSA 2013, BPH    PT Comments    Pt is progressing with PT, amb 140' with RW, min/guard however was dizzy and nauseous after amb. BP 143/75. Improved stability with gait however concerned about pt going home today with dizziness.  Pt son will be with pt and wife (with Parkinson's/dementia) until Sunday.  Son and pt asking about a few HHPT visits if possible.    Recommendations for follow up therapy are one component of a multi-disciplinary discharge planning process, led by the attending physician.  Recommendations may be updated based on patient status, additional functional criteria and insurance authorization.  Follow Up Recommendations  Follow physician's recommendations for discharge plan and follow up therapies     Assistance Recommended at Discharge Intermittent Supervision/Assistance  Patient can return home with the following A little help with walking and/or transfers;A little help with bathing/dressing/bathroom;Assistance with cooking/housework;Assist for transportation;Help with stairs or ramp for entrance   Equipment Recommendations  Rolling walker (2 wheels)    Recommendations for Other Services       Precautions / Restrictions Precautions Precautions: Fall Restrictions Weight Bearing Restrictions: No Other Position/Activity Restrictions: WBAT     Mobility  Bed Mobility Overal bed mobility: Needs Assistance Bed Mobility: Supine to Sit, Sit to Supine     Supine to sit: Min assist Sit to supine: Min assist, Min guard   General bed mobility comments: incr time, use of rail. cues for technique and use of gait belt for return to sit (pt does sleep in recliner at home)    Transfers Overall transfer level: Needs  assistance Equipment used: Rolling walker (2 wheels) Transfers: Sit to/from Stand Sit to Stand: Min guard           General transfer comment: min/guard to transition to RW safely, heavy reliance on UEs    Ambulation/Gait Ambulation/Gait assistance: Min guard Gait Distance (Feet): 140 Feet Assistive device: Rolling walker (2 wheels) Gait Pattern/deviations: Step-to pattern, Decreased stance time - left       General Gait Details: cues for posture, step length, RW position   Stairs             Wheelchair Mobility    Modified Rankin (Stroke Patients Only)       Balance Overall balance assessment: Needs assistance Sitting-balance support: Feet supported, No upper extremity supported Sitting balance-Leahy Scale: Good     Standing balance support: Reliant on assistive device for balance, During functional activity, Bilateral upper extremity supported Standing balance-Leahy Scale: Poor                              Cognition Arousal/Alertness: Awake/alert Behavior During Therapy: WFL for tasks assessed/performed Overall Cognitive Status: Within Functional Limits for tasks assessed                                          Exercises Total Joint Exercises Ankle Circles/Pumps: AROM, Both, 10 reps Quad Sets: AROM, Both, 10 reps Short Arc Quad: AROM, Left, 10 reps Heel Slides: AAROM, AROM, Left, 10 reps Hip ABduction/ADduction: AAROM, Left, 10 reps    General Comments  Pertinent Vitals/Pain Pain Assessment Pain Assessment: 0-10 Pain Score: 3  Pain Location: left hip Pain Descriptors / Indicators: Discomfort, Grimacing, Sore Pain Intervention(s): Limited activity within patient's tolerance, Monitored during session, Premedicated before session, Repositioned, Ice applied    Home Living                          Prior Function            PT Goals (current goals can now be found in the care plan section)  Acute Rehab PT Goals Patient Stated Goal: I'd like to get back to walking PT Goal Formulation: With patient Time For Goal Achievement: 09/11/21 Potential to Achieve Goals: Good Progress towards PT goals: Progressing toward goals    Frequency    7X/week      PT Plan Current plan remains appropriate    Co-evaluation              AM-PAC PT "6 Clicks" Mobility   Outcome Measure  Help needed turning from your back to your side while in a flat bed without using bedrails?: A Little Help needed moving from lying on your back to sitting on the side of a flat bed without using bedrails?: A Little Help needed moving to and from a bed to a chair (including a wheelchair)?: A Little Help needed standing up from a chair using your arms (e.g., wheelchair or bedside chair)?: A Little Help needed to walk in hospital room?: A Little Help needed climbing 3-5 steps with a railing? : A Little 6 Click Score: 18    End of Session Equipment Utilized During Treatment: Gait belt Activity Tolerance: Other (comment) (dizziness.nausea) Patient left: in chair;with call bell/phone within reach;with chair alarm set;with family/visitor present Nurse Communication: Mobility status PT Visit Diagnosis: Pain;Difficulty in walking, not elsewhere classified (R26.2) Pain - Right/Left: Left Pain - part of body: Hip     Time: 5462-7035 PT Time Calculation (min) (ACUTE ONLY): 44 min  Charges:  $Gait Training: 8-22 mins $Therapeutic Exercise: 8-22 mins $Therapeutic Activity: 8-22 mins                     Baxter Flattery, PT  Acute Rehab Dept [facility]) [phone]  [facility] Weekend Pager ([place] only)  [phone]  09/05/2021    [facility] 09/05/2021, 4:18 PM

## 2021-09-05 NOTE — Progress Notes (Signed)
Patient ID: Danny Hunter, male   DOB: 14-Sep-1940, 81 y.o.   MRN: [id number] Subjective: 1 Day Post-Op Procedure(s) (LRB): TOTAL HIP ARTHROPLASTY ANTERIOR APPROACH (Left)    Patient reports pain as mild. Up in chair this morning No events noted or reported  Objective:   VITALS:   Vitals:   09/05/21 0307 09/05/21 0600  BP: 123/73 123/65  Pulse: 70 71  Resp: 17 18  Temp: 98.3 F (36.8 C) 98.3 F (36.8 C)  SpO2: 99% 99%    Neurovascular intact Incision: dressing C/D/I  LABS Recent Labs    09/05/21 0243  HGB 14.0  HCT 43.2  WBC 14.9*  PLT 215    Recent Labs    09/05/21 0243  NA 138  K 4.3  BUN 19  CREATININE 0.96  GLUCOSE 175*    No results for input(s): "LABPT", "INR" in the last 72 hours.   Assessment/Plan: 1 Day Post-Op Procedure(s) (LRB): TOTAL HIP ARTHROPLASTY ANTERIOR APPROACH (Left)   Advance diet Up with therapy Home today after therapy Reviewed post op expectations with regards to activity RTC in 2 weeks

## 2021-09-05 NOTE — Progress Notes (Signed)
Physical Therapy Treatment Patient Details Name: Danny Hunter MRN: [id number] DOB: [birth year] Today's Date: 09/05/2021   History of Present Illness Pt is an 81yo male presenting s/p L-THA, AA on 09/04/21 PMH: anxiety, DM, HLD, HTN, R-TSA 2013, BPH    PT Comments    Pt reports he slept in recliner.  Pt dizzy with mild nausea with amb this am. BP 132/57. Discussed with RN, will see how pt progresses this afternoon. Son concerned about pt progression at d/c.   Recommendations for follow up therapy are one component of a multi-disciplinary discharge planning process, led by the attending physician.  Recommendations may be updated based on patient status, additional functional criteria and insurance authorization.  Follow Up Recommendations  Follow physician's recommendations for discharge plan and follow up therapies     Assistance Recommended at Discharge Intermittent Supervision/Assistance  Patient can return home with the following A little help with walking and/or transfers;A little help with bathing/dressing/bathroom;Assistance with cooking/housework;Assist for transportation;Help with stairs or ramp for entrance   Equipment Recommendations  Rolling walker (2 wheels)    Recommendations for Other Services       Precautions / Restrictions Precautions Precautions: Fall Restrictions Weight Bearing Restrictions: No Other Position/Activity Restrictions: WBAT     Mobility  Bed Mobility               General bed mobility comments: in recliner    Transfers Overall transfer level: Needs assistance Equipment used: Rolling walker (2 wheels) Transfers: Sit to/from Stand Sit to Stand: Min assist, Min guard           General transfer comment: min to min/guard to transition to RW safely    Ambulation/Gait Ambulation/Gait assistance: Min guard Gait Distance (Feet): 70 Feet Assistive device: Rolling walker (2 wheels) Gait Pattern/deviations: Step-to pattern, Decreased  stance time - left       General Gait Details: cues for posture, step length, RW position   Stairs             Wheelchair Mobility    Modified Rankin (Stroke Patients Only)       Balance Overall balance assessment: Needs assistance Sitting-balance support: Feet supported, No upper extremity supported Sitting balance-Leahy Scale: Good     Standing balance support: Reliant on assistive device for balance, During functional activity, Bilateral upper extremity supported Standing balance-Leahy Scale: Poor                              Cognition Arousal/Alertness: Awake/alert Behavior During Therapy: WFL for tasks assessed/performed Overall Cognitive Status: Within Functional Limits for tasks assessed                                          Exercises Total Joint Exercises Ankle Circles/Pumps: AROM, Both, 10 reps    General Comments        Pertinent Vitals/Pain Pain Assessment Pain Assessment: 0-10 Pain Score: 4  Pain Location: left hip Pain Descriptors / Indicators: Burning, Operative site guarding Pain Intervention(s): Limited activity within patient's tolerance, Monitored during session, Premedicated before session, Repositioned, Ice applied    Home Living                          Prior Function            PT Goals (current  goals can now be found in the care plan section) Acute Rehab PT Goals Patient Stated Goal: I'd like to get back to walking PT Goal Formulation: With patient Time For Goal Achievement: 09/11/21 Potential to Achieve Goals: Good Progress towards PT goals: Progressing toward goals    Frequency    7X/week      PT Plan Current plan remains appropriate    Co-evaluation              AM-PAC PT "6 Clicks" Mobility   Outcome Measure  Help needed turning from your back to your side while in a flat bed without using bedrails?: A Little Help needed moving from lying on your back to  sitting on the side of a flat bed without using bedrails?: A Little Help needed moving to and from a bed to a chair (including a wheelchair)?: A Little Help needed standing up from a chair using your arms (e.g., wheelchair or bedside chair)?: A Little Help needed to walk in hospital room?: A Little Help needed climbing 3-5 steps with a railing? : A Little 6 Click Score: 18    End of Session Equipment Utilized During Treatment: Gait belt Activity Tolerance: Other (comment) (dizziness.nausea) Patient left: in chair;with call bell/phone within reach;with chair alarm set;with family/visitor present Nurse Communication: Mobility status PT Visit Diagnosis: Pain;Difficulty in walking, not elsewhere classified (R26.2) Pain - Right/Left: Left Pain - part of body: Hip     Time: 2248-2500 PT Time Calculation (min) (ACUTE ONLY): 28 min  Charges:  $Gait Training: 23-37 mins                     Shreyas Piatkowski, PT  Acute Rehab Dept [facility]) [phone]  [facility] Weekend Pager ([place] only)  [phone]  09/05/2021    [facility] 09/05/2021, 12:08 PM

## 2021-09-06 DIAGNOSIS — E119 Type 2 diabetes mellitus without complications: Secondary | ICD-10-CM | POA: Diagnosis not present

## 2021-09-06 DIAGNOSIS — Z79899 Other long term (current) drug therapy: Secondary | ICD-10-CM | POA: Diagnosis not present

## 2021-09-06 DIAGNOSIS — Z96611 Presence of right artificial shoulder joint: Secondary | ICD-10-CM | POA: Diagnosis not present

## 2021-09-06 DIAGNOSIS — I119 Hypertensive heart disease without heart failure: Secondary | ICD-10-CM | POA: Diagnosis not present

## 2021-09-06 DIAGNOSIS — Z96642 Presence of left artificial hip joint: Secondary | ICD-10-CM | POA: Diagnosis not present

## 2021-09-06 DIAGNOSIS — Z85828 Personal history of other malignant neoplasm of skin: Secondary | ICD-10-CM | POA: Diagnosis not present

## 2021-09-06 DIAGNOSIS — Z7984 Long term (current) use of oral hypoglycemic drugs: Secondary | ICD-10-CM | POA: Diagnosis not present

## 2021-09-06 DIAGNOSIS — M1612 Unilateral primary osteoarthritis, left hip: Secondary | ICD-10-CM | POA: Diagnosis not present

## 2021-09-06 LAB — CBC
HCT: 40.6 % (ref 39.0–52.0)
Hemoglobin: 13.5 g/dL (ref 13.0–17.0)
MCH: 31.5 pg (ref 26.0–34.0)
MCHC: 33.3 g/dL (ref 30.0–36.0)
MCV: 94.6 fL (ref 80.0–100.0)
Platelets: 212 10*3/uL (ref 150–400)
RBC: 4.29 MIL/uL (ref 4.22–5.81)
RDW: 13.2 % (ref 11.5–15.5)
WBC: 17.4 10*3/uL — ABNORMAL HIGH (ref 4.0–10.5)
nRBC: 0 % (ref 0.0–0.2)

## 2021-09-06 LAB — GLUCOSE, CAPILLARY
Glucose-Capillary: 167 mg/dL — ABNORMAL HIGH (ref 70–99)
Glucose-Capillary: 92 mg/dL (ref 70–99)

## 2021-09-06 MED ORDER — ONDANSETRON HCL 4 MG PO TABS: 4.0000 mg | ORAL_TABLET | Freq: Four times a day (QID) | ORAL | 0 refills | Status: DC | PRN

## 2021-09-06 MED ORDER — SODIUM CHLORIDE 0.9 % IV BOLUS
250.0000 mL | Freq: Once | INTRAVENOUS | Status: AC
  Administered 2021-09-06: 250 mL via INTRAVENOUS

## 2021-09-06 NOTE — Progress Notes (Signed)
Physical Therapy Treatment Patient Details Name: ERNIE KASLER MRN: [id number] DOB: [birth year] Today's Date: 09/06/2021   History of Present Illness Pt is an 81yo male presenting s/p L-THA, AA on 09/04/21 PMH: anxiety, DM, HLD, HTN, R-TSA 2013, BPH    PT Comments    POD # 2 Pt AxO x 3 very motivated to "go home".  Son present during session.  Had Son "hands on" assist pt out of recliner to amb in hallway.  General transfer comment: one VC on proper hand placement and 25% VC's safety with turns "using walker throughout". Then returned to room to perform some TE's following HEP handout.  Instructed on proper tech, freq as well as use of ICE.   Addressed all mobility questions, discussed appropriate activity, educated on use of ICE.  Pt ready for D/C to home.   Recommendations for follow up therapy are one component of a multi-disciplinary discharge planning process, led by the attending physician.  Recommendations may be updated based on patient status, additional functional criteria and insurance authorization.  Follow Up Recommendations  Follow physician's recommendations for discharge plan and follow up therapies     Assistance Recommended at Discharge Intermittent Supervision/Assistance  Patient can return home with the following A little help with walking and/or transfers;A little help with bathing/dressing/bathroom;Assistance with cooking/housework;Assist for transportation;Help with stairs or ramp for entrance   Equipment Recommendations  Rolling walker (2 wheels)    Recommendations for Other Services       Precautions / Restrictions Precautions Precautions: Fall Restrictions Weight Bearing Restrictions: No Other Position/Activity Restrictions: WBAT     Mobility  Bed Mobility               General bed mobility comments: OOB in recliner    Transfers Overall transfer level: Needs assistance Equipment used: Rolling walker (2 wheels) Transfers: Sit to/from  Stand Sit to Stand: Supervision           General transfer comment: one VC on proper hand placement and 25% VC's safety with turns "using walker throughout".    Ambulation/Gait Ambulation/Gait assistance: Supervision, Min guard Gait Distance (Feet): 125 Feet Assistive device: Rolling walker (2 wheels) Gait Pattern/deviations: Step-to pattern, Decreased stance time - left Gait velocity: decreased     General Gait Details: tolerated a functional distance with NO dizziness.  BP 126/74.  Pt did receive IV Boulus this morning.  Had Son "ahnds on" assist with instruction on safe handling and appropriate activity level.  Pt wants to walk to dinning room for meals "right away" but advised to have meals delivered to his room and progress waliking distance before attempting dinning roomdistance  which is also on another floor involving an elevator ride.   Stairs             Wheelchair Mobility    Modified Rankin (Stroke Patients Only)       Balance                                            Cognition Arousal/Alertness: Awake/alert Behavior During Therapy: WFL for tasks assessed/performed Overall Cognitive Status: Within Functional Limits for tasks assessed                                 General Comments: AxO x 3 very pleasant and motivated  Exercises  Total Hip Replacement TE's following HEP Handout 10 reps ankle pumps 05 reps knee presses 05 reps heel slides 05 reps SAQ's 05 reps ABD 05 reps all standing TE's  Instructed how to use a belt loop to assist  Followed by ICE     General Comments        Pertinent Vitals/Pain Pain Assessment Pain Assessment: 0-10 Pain Score: 5  Pain Location: left hip Pain Descriptors / Indicators: Discomfort, Grimacing, Sore, Operative site guarding Pain Intervention(s): Monitored during session, Premedicated before session, Repositioned    Home Living                           Prior Function            PT Goals (current goals can now be found in the care plan section) Progress towards PT goals: Progressing toward goals    Frequency    7X/week      PT Plan Current plan remains appropriate    Co-evaluation              AM-PAC PT "6 Clicks" Mobility   Outcome Measure  Help needed turning from your back to your side while in a flat bed without using bedrails?: A Little Help needed moving from lying on your back to sitting on the side of a flat bed without using bedrails?: A Little Help needed moving to and from a bed to a chair (including a wheelchair)?: A Little Help needed standing up from a chair using your arms (e.g., wheelchair or bedside chair)?: A Little Help needed to walk in hospital room?: A Little Help needed climbing 3-5 steps with a railing? : A Little 6 Click Score: 18    End of Session Equipment Utilized During Treatment: Gait belt Activity Tolerance: Patient tolerated treatment well Patient left: in chair;with call bell/phone within reach;with chair alarm set;with family/visitor present Nurse Communication: Mobility status PT Visit Diagnosis: Pain;Difficulty in walking, not elsewhere classified (R26.2) Pain - Right/Left: Left Pain - part of body: Hip     Time: 9024-0973 PT Time Calculation (min) (ACUTE ONLY): 26 min  Charges:  $Gait Training: 8-22 mins $Therapeutic Exercise: 8-22 mins                     {Emilygrace Grothe  PTA Acute  Rehabilitation [employer] M-F          [phone] Weekend pager [phone]

## 2021-09-06 NOTE — Progress Notes (Signed)
Patient ID: Danny Hunter, male   DOB: [birth year], 81 y.o.   MRN: [id number] Subjective: 2 Days Post-Op Procedure(s) (LRB): TOTAL HIP ARTHROPLASTY ANTERIOR APPROACH (Left)    Patient reports pain as mild. He states that the nausea and dizziness seem to be a lot better this am Did well with therapy yesterday   Objective:   VITALS:   Vitals:   09/05/21 2151 09/06/21 0552  BP: 133/66 (!) 119/57  Pulse: 71 75  Resp: 17 17  Temp: 98.1 F (36.7 C) 98.1 F (36.7 C)  SpO2: 98% 95%    Neurovascular intact Incision: dressing C/D/I  LABS Recent Labs    09/05/21 0243 09/06/21 0234  HGB 14.0 13.5  HCT 43.2 40.6  WBC 14.9* 17.4*  PLT 215 212    Recent Labs    09/05/21 0243  NA 138  K 4.3  BUN 19  CREATININE 0.96  GLUCOSE 175*    No results for input(s): "LABPT", "INR" in the last 72 hours.   Assessment/Plan: 2 Days Post-Op Procedure(s) (LRB): TOTAL HIP ARTHROPLASTY ANTERIOR APPROACH (Left)   Up with therapy Home today after therapy Should be fine with PT instructions provided from hospital and simply work on mobility and simple exercises at home RTC in 2 weeks

## 2021-09-06 NOTE — Plan of Care (Signed)
  Problem: Education: Goal: Ability to describe self-care measures that may prevent or decrease complications (Diabetes Survival Skills Education) will improve Outcome: Progressing Goal: Individualized Educational Video(s) Outcome: Progressing   Problem: Coping: Goal: Ability to adjust to condition or change in health will improve Outcome: Progressing   Problem: Fluid Volume: Goal: Ability to maintain a balanced intake and output will improve Outcome: Progressing   Problem: Health Behavior/Discharge Planning: Goal: Ability to identify and utilize available resources and services will improve Outcome: Progressing Goal: Ability to manage health-related needs will improve Outcome: Progressing   Problem: Metabolic: Goal: Ability to maintain appropriate glucose levels will improve Outcome: Progressing   Problem: Nutritional: Goal: Maintenance of adequate nutrition will improve Outcome: Progressing Goal: Progress toward achieving an optimal weight will improve Outcome: Progressing   Problem: Skin Integrity: Goal: Risk for impaired skin integrity will decrease Outcome: Progressing   Problem: Tissue Perfusion: Goal: Adequacy of tissue perfusion will improve Outcome: Progressing   Problem: Education: Goal: Knowledge of General Education information will improve Description: Including pain rating scale, medication(s)/side effects and non-pharmacologic comfort measures Outcome: Progressing   Problem: Clinical Measurements: Goal: Ability to maintain clinical measurements within normal limits will improve Outcome: Progressing Goal: Will remain free from infection Outcome: Progressing Goal: Diagnostic test results will improve Outcome: Progressing Goal: Respiratory complications will improve Outcome: Progressing Goal: Cardiovascular complication will be avoided Outcome: Progressing   Problem: Health Behavior/Discharge Planning: Goal: Ability to manage health-related needs  will improve Outcome: Progressing

## 2021-09-06 NOTE — TOC Progression Note (Signed)
Transition of Care [facility]) - Progression Note   Patient Details  Name: Danny Hunter MRN: [id number] Date of Birth: 07-30-1940  Transition of [facility]) CM/SW [place], LCSW Phone Number: 09/06/2021, 10:21 AM  Clinical Narrative: CSW notified by RN that patient was asking about HHPT. CSW spoke with patient and asked if patient had discussed [place] with the orthopedist this morning. Patient reported he had not. CSW explained that most patients that get a hip replacement go home with an HEP and recommended patient follow up with the orthopedist's office regarding [place] since it was not initially recommended. Patient verbalized understanding.    Barriers to Discharge: No Barriers Identified  Expected Discharge Plan and Services Expected Discharge Date: 09/06/21               DME Arranged: Gilford Rile rolling DME Agency: [employer] Representative spoke with at DME Agency: Prearranged in orthopedist's office  Readmission Risk Interventions     No data to display

## 2021-09-06 NOTE — Progress Notes (Signed)
Pt is ready for discharge.  PIV removed.  Pt is alert and oriented.  Son at bedside.  AVS given.  Pt able to verbalize understanding all of discharge instructions, including wound care and mobility.  All questions answered.

## 2021-09-06 NOTE — Plan of Care (Signed)
Problem: Education: Goal: Ability to describe self-care measures that may prevent or decrease complications (Diabetes Survival Skills Education) will improve 09/06/2021 1227 by Chonte Ricke L, RN Outcome: Adequate for Discharge 09/06/2021 0835 by Adriyanna Christians L, RN Outcome: Progressing Goal: Individualized Educational Video(s) 09/06/2021 1227 by Kemiya Batdorf L, RN Outcome: Adequate for Discharge 09/06/2021 0835 by Saman Umstead L, RN Outcome: Progressing   Problem: Education: Goal: Individualized Educational Video(s) 09/06/2021 1227 by Zykera Abella L, RN Outcome: Adequate for Discharge 09/06/2021 0835 by Kaylany Tesoriero L, RN Outcome: Progressing   Problem: Coping: Goal: Ability to adjust to condition or change in health will improve 09/06/2021 1227 by Ilisha Blust L, RN Outcome: Adequate for Discharge 09/06/2021 0835 by Geo Slone L, RN Outcome: Progressing   Problem: Fluid Volume: Goal: Ability to maintain a balanced intake and output will improve 09/06/2021 1227 by Shareef Eddinger L, RN Outcome: Adequate for Discharge 09/06/2021 0835 by Earlene Bjelland L, RN Outcome: Progressing   Problem: Health Behavior/Discharge Planning: Goal: Ability to identify and utilize available resources and services will improve 09/06/2021 1227 by Williette Loewe L, RN Outcome: Adequate for Discharge 09/06/2021 0835 by Dian Laprade L, RN Outcome: Progressing Goal: Ability to manage health-related needs will improve 09/06/2021 1227 by Kassity Woodson L, RN Outcome: Adequate for Discharge 09/06/2021 0835 by Tanasha Menees L, RN Outcome: Progressing   Problem: Metabolic: Goal: Ability to maintain appropriate glucose levels will improve 09/06/2021 1227 by Riaan Toledo L, RN Outcome: Adequate for Discharge 09/06/2021 0835 by Galen Russman L, RN Outcome: Progressing   Problem: Nutritional: Goal: Maintenance of adequate nutrition will improve 09/06/2021 1227 by Pranit Owensby L, RN Outcome: Adequate for  Discharge 09/06/2021 0835 by Janne Faulk L, RN Outcome: Progressing Goal: Progress toward achieving an optimal weight will improve 09/06/2021 1227 by Verita Kuroda L, RN Outcome: Adequate for Discharge 09/06/2021 0835 by Harsha Yusko L, RN Outcome: Progressing   Problem: Skin Integrity: Goal: Risk for impaired skin integrity will decrease 09/06/2021 1227 by Fremon Zacharia L, RN Outcome: Adequate for Discharge 09/06/2021 0835 by Cesar Rogerson L, RN Outcome: Progressing   Problem: Tissue Perfusion: Goal: Adequacy of tissue perfusion will improve 09/06/2021 1227 by Hipolito Martinezlopez L, RN Outcome: Adequate for Discharge 09/06/2021 0835 by Ajmal Kathan L, RN Outcome: Progressing   Problem: Education: Goal: Knowledge of General Education information will improve Description: Including pain rating scale, medication(s)/side effects and non-pharmacologic comfort measures 09/06/2021 1227 by Briseida Gittings L, RN Outcome: Adequate for Discharge 09/06/2021 0835 by Gerre Ranum L, RN Outcome: Progressing   Problem: Health Behavior/Discharge Planning: Goal: Ability to manage health-related needs will improve 09/06/2021 1227 by Joss Mcdill L, RN Outcome: Adequate for Discharge 09/06/2021 0835 by Ajit Errico L, RN Outcome: Progressing   Problem: Clinical Measurements: Goal: Ability to maintain clinical measurements within normal limits will improve 09/06/2021 1227 by Pasqualina Colasurdo L, RN Outcome: Adequate for Discharge 09/06/2021 0835 by Shavontae Gibeault L, RN Outcome: Progressing Goal: Will remain free from infection 09/06/2021 1227 by Harshika Mago L, RN Outcome: Adequate for Discharge 09/06/2021 0835 by Kimberlyann Hollar L, RN Outcome: Progressing Goal: Diagnostic test results will improve 09/06/2021 1227 by Avyukt Cimo L, RN Outcome: Adequate for Discharge 09/06/2021 0835 by Nayzeth Altman L, RN Outcome: Progressing Goal: Respiratory complications will improve 09/06/2021 1227 by Ceejay Kegley L, RN Outcome:  Adequate for Discharge 09/06/2021 0835 by Tadarrius Burch L, RN Outcome: Progressing Goal: Cardiovascular complication will be avoided 09/06/2021 1227 by Miriam Kestler L, RN Outcome: Adequate for Discharge 09/06/2021 0835 by  Cheril Slattery L, RN Outcome: Progressing   Problem: Activity: Goal: Risk for activity intolerance will decrease 09/06/2021 1227 by Debara Kamphuis L, RN Outcome: Adequate for Discharge 09/06/2021 0835 by Augustin Bun L, RN Outcome: Progressing   Problem: Nutrition: Goal: Adequate nutrition will be maintained 09/06/2021 1227 by Emslee Lopezmartinez L, RN Outcome: Adequate for Discharge 09/06/2021 0835 by Aubryn Spinola L, RN Outcome: Progressing   Problem: Coping: Goal: Level of anxiety will decrease 09/06/2021 1227 by Sakiya Stepka L, RN Outcome: Adequate for Discharge 09/06/2021 0835 by Michaelann Gunnoe L, RN Outcome: Progressing   Problem: Elimination: Goal: Will not experience complications related to bowel motility 09/06/2021 1227 by Tamari Redwine L, RN Outcome: Adequate for Discharge 09/06/2021 0835 by Bynum Mccullars L, RN Outcome: Progressing Goal: Will not experience complications related to urinary retention 09/06/2021 1227 by Adrielle Polakowski L, RN Outcome: Adequate for Discharge 09/06/2021 0835 by Mariabelen Pressly L, RN Outcome: Progressing   Problem: Pain Managment: Goal: General experience of comfort will improve 09/06/2021 1227 by Oliver Heitzenrater L, RN Outcome: Adequate for Discharge 09/06/2021 0835 by Wafaa Deemer L, RN Outcome: Progressing   Problem: Safety: Goal: Ability to remain free from injury will improve 09/06/2021 1227 by Yahye Siebert L, RN Outcome: Adequate for Discharge 09/06/2021 0835 by Shaeleigh Graw L, RN Outcome: Progressing   Problem: Skin Integrity: Goal: Risk for impaired skin integrity will decrease 09/06/2021 1227 by Mirna Sutcliffe L, RN Outcome: Adequate for Discharge 09/06/2021 0835 by Marajade Lei L, RN Outcome: Progressing   Problem: Education: Goal:  Knowledge of the prescribed therapeutic regimen will improve 09/06/2021 1227 by Daquana Paddock L, RN Outcome: Adequate for Discharge 09/06/2021 0835 by Nicoletta Hush L, RN Outcome: Progressing Goal: Understanding of discharge needs will improve 09/06/2021 1227 by Saliou Barnier L, RN Outcome: Adequate for Discharge 09/06/2021 0835 by Nandini Bogdanski L, RN Outcome: Progressing Goal: Individualized Educational Video(s) 09/06/2021 1227 by Lorrain Rivers L, RN Outcome: Adequate for Discharge 09/06/2021 0835 by Peggye Poon L, RN Outcome: Progressing   Problem: Activity: Goal: Ability to avoid complications of mobility impairment will improve 09/06/2021 1227 by Michael Ventresca L, RN Outcome: Adequate for Discharge 09/06/2021 0835 by Ahsha Hinsley L, RN Outcome: Progressing Goal: Ability to tolerate increased activity will improve 09/06/2021 1227 by Dorothey Oetken L, RN Outcome: Adequate for Discharge 09/06/2021 0835 by Brett Darko L, RN Outcome: Progressing   Problem: Pain Management: Goal: Pain level will decrease with appropriate interventions 09/06/2021 1227 by Holle Sprick L, RN Outcome: Adequate for Discharge 09/06/2021 0835 by Linnea Todisco L, RN Outcome: Progressing   Problem: Education: Goal: Knowledge of the prescribed therapeutic regimen will improve 09/06/2021 1227 by Imanol Bihl L, RN Outcome: Adequate for Discharge 09/06/2021 0835 by Salvator Seppala L, RN Outcome: Progressing Goal: Understanding of discharge needs will improve 09/06/2021 1227 by Malayja Freund L, RN Outcome: Adequate for Discharge 09/06/2021 0835 by Jerron Niblack L, RN Outcome: Progressing Goal: Individualized Educational Video(s) 09/06/2021 1227 by Britaney Espaillat L, RN Outcome: Adequate for Discharge 09/06/2021 0835 by Johna Kearl L, RN Outcome: Progressing   Problem: Activity: Goal: Ability to avoid complications of mobility impairment will improve 09/06/2021 1227 by David Towson L, RN Outcome: Adequate for  Discharge 09/06/2021 0835 by Corrinne Benegas L, RN Outcome: Progressing Goal: Ability to tolerate increased activity will improve 09/06/2021 1227 by Vandy Fong L, RN Outcome: Adequate for Discharge 09/06/2021 [id number] by Tahliyah Anagnos L, RN Outcome: Progressing   Problem: Clinical Measurements: Goal: Postoperative complications will be avoided or minimized 09/06/2021 1227 by  Tashana Haberl L, RN Outcome: Adequate for Discharge 09/06/2021 0835 by Emilly Lavey L, RN Outcome: Progressing   Problem: Pain Management: Goal: Pain level will decrease with appropriate interventions 09/06/2021 1227 by Mcdonald Reiling L, RN Outcome: Adequate for Discharge 09/06/2021 0835 by Swayzee Wadley L, RN Outcome: Progressing   Problem: Skin Integrity: Goal: Will show signs of wound healing 09/06/2021 1227 by Erion Hermans L, RN Outcome: Adequate for Discharge 09/06/2021 0835 by Klaryssa Fauth L, RN Outcome: Progressing

## 2021-09-10 DIAGNOSIS — M6281 Muscle weakness (generalized): Secondary | ICD-10-CM | POA: Diagnosis not present

## 2021-09-17 DIAGNOSIS — M6281 Muscle weakness (generalized): Secondary | ICD-10-CM | POA: Diagnosis not present

## 2021-09-17 NOTE — Discharge Summary (Signed)
Physician Discharge Summary   Patient ID: Danny Hunter MRN: [id number] DOB/AGE: Feb 18, 1940 81 y.o.  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   Weight as of this encounter: 80.7 kg.  Procedure:  Procedure(s) (LRB): TOTAL HIP ARTHROPLASTY ANTERIOR APPROACH (Left)   Consults: None  HPI: Danny Hunter is a 81 y.o. male who had   presented to office for evaluation of left hip pain.  Radiographs revealed   progressive degenerative changes with bone-on-bone   articulation of the  hip joint, including subchondral cystic changes and osteophytes.  The patient had painful limited range of   motion significantly affecting their overall quality of life and function.  The patient was failing to    respond to conservative measures including medications and/or injections and activity modification and at this point was ready   to proceed with more definitive measures.  Consent was obtained for   benefit of pain relief.  Specific risks of infection, DVT, component   failure, dislocation, neurovascular injury, and need for revision surgery were reviewed in the office.     Laboratory Data: Admission on 09/04/2021, Discharged on 09/06/2021  Component Date Value Ref Range Status   Glucose-Capillary 09/04/2021 160 (H)  70 - 99 mg/dL Final   Glucose  reference range applies only to samples taken after fasting for at least 8 hours.   Glucose-Capillary 09/04/2021 109 (H)  70 - 99 mg/dL Final   Glucose reference range applies only to samples taken after fasting for at least 8 hours.   Glucose-Capillary 09/04/2021 159 (H)  70 - 99 mg/dL Final   Glucose reference range applies only to samples taken after fasting for at least 8 hours.   WBC 09/05/2021 14.9 (H)  4.0 - 10.5 K/uL Final   RBC 09/05/2021 4.49  4.22 - 5.81 MIL/uL Final   Hemoglobin 09/05/2021 14.0  13.0 - 17.0 g/dL Final   HCT 09/05/2021 43.2  39.0 - 52.0 % Final   MCV 09/05/2021 96.2  80.0 - 100.0 fL Final   MCH 09/05/2021 31.2  26.0 - 34.0 pg Final   MCHC 09/05/2021 32.4  30.0 - 36.0 g/dL Final   RDW 09/05/2021 12.9  11.5 - 15.5 % Final   Platelets 09/05/2021 215  150 - 400 K/uL Final   nRBC 09/05/2021 0.0  0.0 - 0.2 % Final   Performed at [facility], [place] [address] [place], [place] [address]   Sodium 09/05/2021 138  135 - 145 mmol/L Final   Potassium 09/05/2021 4.3  3.5 - 5.1 mmol/L Final   Chloride 09/05/2021 106  98 - 111 mmol/L Final   CO2 09/05/2021 26  22 - 32 mmol/L Final   Glucose, Bld  09/05/2021 175 (H)  70 - 99 mg/dL Final   Glucose reference range applies only to samples taken after fasting for at least 8 hours.   BUN 09/05/2021 19  8 - 23 mg/dL Final   Creatinine, Ser 09/05/2021 0.96  0.61 - 1.24 mg/dL Final   Calcium 09/05/2021 9.5  8.9 - 10.3 mg/dL Final   GFR, Estimated 09/05/2021 >60  >60 mL/min Final   Comment: (NOTE) Calculated using the CKD-EPI Creatinine Equation (2021)    Anion gap 09/05/2021 6  5 - 15 Final   Performed at [facility], [place] [address], [place], [place] [address]   Glucose-Capillary 09/04/2021 190 (H)  70 - 99 mg/dL Final   Glucose reference range applies only to samples taken after fasting for at least 8 hours.   Glucose-Capillary 09/05/2021 124 (H)  70 - 99 mg/dL Final   Glucose reference  range applies only to samples taken after fasting for at least 8 hours.   Glucose-Capillary 09/05/2021 138 (H)  70 - 99 mg/dL Final   Glucose reference range applies only to samples taken after fasting for at least 8 hours.   WBC 09/06/2021 17.4 (H)  4.0 - 10.5 K/uL Final   RBC 09/06/2021 4.29  4.22 - 5.81 MIL/uL Final   Hemoglobin 09/06/2021 13.5  13.0 - 17.0 g/dL Final   HCT 09/06/2021 40.6  39.0 - 52.0 % Final   MCV 09/06/2021 94.6  80.0 - 100.0 fL Final   MCH 09/06/2021 31.5  26.0 - 34.0 pg Final   MCHC 09/06/2021 33.3  30.0 - 36.0 g/dL Final   RDW 09/06/2021 13.2  11.5 - 15.5 % Final   Platelets 09/06/2021 212  150 - 400 K/uL Final   nRBC 09/06/2021 0.0  0.0 - 0.2 % Final   Performed at [facility], [place] [address] [place], [place] [address]   Glucose-Capillary 09/05/2021 177 (H)  70 - 99 mg/dL Final   Glucose reference range applies only to samples taken after fasting for at least 8 hours.   Glucose-Capillary 09/05/2021 140 (H)  70 - 99 mg/dL Final   Glucose reference range applies only to samples taken after fasting for at least 8 hours.   Glucose-Capillary 09/06/2021 167 (H)  70 - 99 mg/dL Final   Glucose reference range applies only to samples taken after fasting for at least 8 hours.   Glucose-Capillary 09/06/2021 92  70 - 99 mg/dL Final   Glucose reference range applies only to samples taken after fasting for at least 8 hours.  Hospital Outpatient Visit on 08/23/2021  Component Date Value Ref Range Status   MRSA, PCR 08/23/2021 NEGATIVE  NEGATIVE Final   Staphylococcus aureus 08/23/2021 POSITIVE (A)  NEGATIVE Final   Comment: (NOTE) The Xpert SA Assay (FDA approved for NASAL specimens in patients 11 years of age and older), is one component of a comprehensive surveillance program. It is not intended to diagnose infection nor to guide or monitor treatment. Performed at [facility], [place] [address] [place], [place] [address]     ABO/RH(D) 08/23/2021 O POS   Final   Antibody Screen 08/23/2021 NEG   Final   Sample Expiration 08/23/2021 09/06/2021,2359   Final   Extend sample reason 08/23/2021    Final                   Value:NO TRANSFUSIONS OR PREGNANCY IN THE PAST 3 MONTHS Performed at [place] [address]  [place], [place] [address]    Hgb A1c MFr Bld 08/23/2021 6.1 (H)  4.8 - 5.6 % Final   Comment: (NOTE) Pre diabetes:          5.7%-6.4%  Diabetes:              >6.4%  Glycemic control for   <7.0% adults with diabetes    Mean Plasma Glucose 08/23/2021 128.37  mg/dL Final   Performed at [place] [address], [place], [place] [address]   WBC 08/23/2021 8.9  4.0 - 10.5 K/uL Final   RBC 08/23/2021 5.26  4.22 - 5.81 MIL/uL Final   Hemoglobin 08/23/2021 16.3  13.0 - 17.0 g/dL Final   HCT 08/23/2021 50.6  39.0 - 52.0 % Final   MCV 08/23/2021 96.2  80.0 - 100.0 fL Final   MCH 08/23/2021 31.0  26.0 - 34.0 pg Final   MCHC 08/23/2021 32.2  30.0 - 36.0 g/dL Final   RDW 08/23/2021 13.2  11.5 - 15.5 % Final   Platelets 08/23/2021 253  150 - 400 K/uL Final   nRBC 08/23/2021 0.0  0.0 - 0.2 % Final   Performed at [facility], [place] [address], [place], [place] [address]   Sodium 08/23/2021 137  135 - 145 mmol/L Final   Potassium 08/23/2021 5.4 (H)  3.5 - 5.1 mmol/L Final   Chloride 08/23/2021 103  98 - 111 mmol/L Final   CO2 08/23/2021 26  22 - 32 mmol/L Final   Glucose, Bld 08/23/2021 116 (H)  70 - 99 mg/dL Final   Glucose reference range applies only to samples taken after fasting for at least 8 hours.   BUN 08/23/2021 17  8 - 23 mg/dL Final   Creatinine, Ser 08/23/2021 0.96  0.61 - 1.24 mg/dL Final   Calcium 08/23/2021 10.5 (H)  8.9 - 10.3 mg/dL Final   GFR, Estimated 08/23/2021 >60  >60 mL/min Final   Comment: (NOTE) Calculated using the CKD-EPI Creatinine Equation (2021)    Anion gap 08/23/2021 8  5 - 15 Final   Performed at [facility], [place] [address], [place],  [address]   Glucose-Capillary 08/23/2021 110 (H)  70 - 99 mg/dL Final   Glucose reference range applies only to samples taken after fasting for at least 8 hours.     X-Rays:DG Pelvis Portable  Result Date: 09/04/2021 CLINICAL DATA:  Status post left total hip replacement EXAM: PORTABLE PELVIS 1-2 VIEWS COMPARISON:  09/04/2021 intraoperative fluoroscopy FINDINGS: Status post left total hip arthroplasty. No periprosthetic lucency or fracture. Near anatomic alignment of the prosthetic components. Air within the soft tissues is not unexpected postoperatively. Degenerative changes in the right hip. IMPRESSION: Expected postoperative appearance, status post left total hip arthroplasty. Electronically Signed   By: Merilyn Baba M.D.   On: 09/04/2021 13:51   DG HIP UNILAT WITH PELVIS 2-3 VIEWS LEFT  Result Date: 09/04/2021 CLINICAL DATA:  Left hip replacement EXAM: DG HIP (WITH OR WITHOUT PELVIS) 2-3V LEFT COMPARISON:  Hip radiographs 04/26/2021 FINDINGS: Nine C-arm fluoroscopic images were obtained intraoperatively and submitted for post operative interpretation. The initial image demonstrates degenerative changes about the left hip. Subsequent images demonstrate postsurgical changes reflecting left hip arthroplasty. Hardware alignment is within expected limits. Degenerative changes are noted about the right hip. Fluoro time 11 seconds. Dose 1.21 mGy. Please see the performing provider's procedural report for further detail. IMPRESSION: Intraoperative images during left hip arthroplasty as above. Electronically Signed   By:  Valetta Mole M.D.   On: 09/04/2021 12:44   DG C-Arm 1-60 Min-No Report  Result Date: 09/04/2021 Fluoroscopy was utilized by the requesting physician.  No radiographic interpretation.    EKG: Orders placed or performed in visit on 11/27/20   EKG 12-Lead     Hospital Course: Danny Hunter is a 81 y.o. who was admitted to [facility]. They were brought to the operating room on 09/04/2021 and underwent Procedure(s): [place].  Patient tolerated the procedure well and was later transferred to the recovery room and then to the orthopaedic floor for postoperative care. They were given PO and IV analgesics for pain control following their surgery. They were given 24 hours of postoperative antibiotics of  Anti-infectives (From admission, onward)    Start     Dose/Rate Route Frequency Ordered Stop   09/04/21 1800  ceFAZolin (ANCEF) IVPB 2g/100 mL premix        2 g 200 mL/hr over 30 Minutes Intravenous Every 6 hours 09/04/21 1639 09/05/21 0010   09/04/21 0915  ceFAZolin (ANCEF) IVPB 2g/100 mL premix        2 g 200 mL/hr over 30 Minutes Intravenous On call to O.R. 09/04/21 0321 09/04/21 1203      and started on DVT prophylaxis in the form of Aspirin.   PT and OT were ordered for total joint protocol. Discharge planning consulted to help with postop disposition and equipment needs. Patient had a good night on the evening of surgery. They started to get up OOB with therapy on POD #1 but was limited by dizziness.  Continued to work with therapy into POD #2. Pt was seen during rounds on day two and was ready to go home pending progress with therapy. Pt worked with therapy for one additional session and was meeting their goals. He was discharged to home later that day in stable condition.  Diet: Regular diet Activity: WBAT Follow-up: in 2 weeks Disposition: Home Discharged Condition: good   Discharge Instructions     Call MD / Call 911   Complete by: As directed    If you experience chest pain or shortness of breath, CALL 911 and be transported to the hospital emergency room.  If you develope a fever above 101 F, pus (white drainage) or increased drainage or redness at the wound, or calf pain, call your surgeon's office.   Change dressing   Complete by: As directed    Maintain surgical  dressing until follow up in the clinic. If the edges start to pull up, may reinforce with tape. If the dressing is no longer working, may remove and cover with gauze and tape, but must keep the area dry and clean.  Call with any questions or concerns.   Constipation Prevention   Complete by: As directed    Drink plenty of fluids.  Prune juice may be helpful.  You may use a stool softener, such as Colace (over the counter) 100 mg twice a day.  Use MiraLax (over the counter) for constipation as needed.   Diet - low sodium heart healthy   Complete by: As directed    Increase activity slowly as tolerated   Complete by: As directed    Weight bearing as tolerated with assist device (walker, cane, etc) as directed, use it as long as suggested by your surgeon or therapist, typically at least 4-6 weeks.   Post-operative opioid taper instructions:   Complete by: As directed  POST-OPERATIVE OPIOID TAPER INSTRUCTIONS: It is important to wean off of your opioid medication as soon as possible. If you do not need pain medication after your surgery it is ok to stop day one. Opioids include: Codeine, Hydrocodone(Norco, Vicodin), Oxycodone(Percocet, oxycontin) and hydromorphone amongst others.  Long term and even short term use of opiods can cause: Increased pain response Dependence Constipation Depression Respiratory depression And more.  Withdrawal symptoms can include Flu like symptoms Nausea, vomiting And more Techniques to manage these symptoms Hydrate well Eat regular healthy meals Stay active Use relaxation techniques(deep breathing, meditating, yoga) Do Not substitute Alcohol to help with tapering If you have been on opioids for less than two weeks and do not have pain than it is ok to stop all together.  Plan to wean off of opioids This plan should start within one week post op of your joint replacement. Maintain the same interval or time between taking each dose and first decrease the  dose.  Cut the total daily intake of opioids by one tablet each day Next start to increase the time between doses. The last dose that should be eliminated is the evening dose.      TED hose   Complete by: As directed    Use stockings (TED hose) for 2 weeks on both leg(s).  You may remove them at night for sleeping.      Allergies as of 09/06/2021       Reactions   Penicillin G Other (See Comments)   Childhood Reaction  Tolerated Cephalosporin Date: 09/05/21.        Medication List     TAKE these medications    amLODipine 5 MG tablet Commonly known as: NORVASC Take 1 tablet (5 mg total) by mouth daily.   aspirin 81 MG chewable tablet Chew 1 tablet (81 mg total) by mouth 2 (two) times daily for 28 days.   atorvastatin 40 MG tablet Commonly known as: LIPITOR Take 1 tablet (40 mg total) by mouth daily.   docusate sodium 100 MG capsule Commonly known as: COLACE Take 1 capsule (100 mg total) by mouth 2 (two) times daily.   finasteride 5 MG tablet Commonly known as: PROSCAR Take 1 tablet (5 mg total) by mouth daily.   HYDROcodone-acetaminophen 5-325 MG tablet Commonly known as: NORCO/VICODIN Take 1 tablet by mouth every 4 (four) hours as needed for severe pain.   metFORMIN 500 MG tablet Commonly known as: GLUCOPHAGE Take 2 tablets (1,000 mg total) by mouth 2 (two) times daily with a meal. What changed:  how much to take when to take this additional instructions   methocarbamol 500 MG tablet Commonly known as: ROBAXIN Take 1 tablet (500 mg total) by mouth every 6 (six) hours as needed for muscle spasms.   ondansetron 4 MG tablet Commonly known as: ZOFRAN Take 1 tablet (4 mg total) by mouth every 6 (six) hours as needed for nausea.   polyethylene glycol 17 g packet Commonly known as: MIRALAX / GLYCOLAX Take 17 g by mouth daily as needed for mild constipation.   silodosin 8 MG Caps capsule Commonly known as: RAPAFLO Take 8 mg by mouth daily.    spironolactone 25 MG tablet Commonly known as: ALDACTONE Take 1 tablet (25 mg total) by mouth daily.               Discharge Care Instructions  (From admission, onward)           Start     Ordered  09/05/21 0000  Change dressing       Comments: Maintain surgical dressing until follow up in the clinic. If the edges start to pull up, may reinforce with tape. If the dressing is no longer working, may remove and cover with gauze and tape, but must keep the area dry and clean.  Call with any questions or concerns.   09/05/21 0847            Follow-up Information     Paralee Cancel, MD. Schedule an appointment as soon as possible for a visit in 2 week(s).   Specialty: Orthopedic Surgery Contact information: [address] [place] [address] [phone]                 Signed: Griffith Citron, PA-C Orthopedic Surgery 09/17/2021, 7:55 AM

## 2021-09-19 DIAGNOSIS — M25562 Pain in left knee: Secondary | ICD-10-CM | POA: Diagnosis not present

## 2021-09-19 DIAGNOSIS — Z5189 Encounter for other specified aftercare: Secondary | ICD-10-CM | POA: Diagnosis not present

## 2021-09-20 ENCOUNTER — Ambulatory Visit: Payer: Medicare HMO

## 2021-09-24 DIAGNOSIS — R351 Nocturia: Secondary | ICD-10-CM | POA: Diagnosis not present

## 2021-09-24 DIAGNOSIS — M6281 Muscle weakness (generalized): Secondary | ICD-10-CM | POA: Diagnosis not present

## 2021-09-24 DIAGNOSIS — N401 Enlarged prostate with lower urinary tract symptoms: Secondary | ICD-10-CM | POA: Diagnosis not present

## 2021-10-01 DIAGNOSIS — M6281 Muscle weakness (generalized): Secondary | ICD-10-CM | POA: Diagnosis not present

## 2021-10-15 ENCOUNTER — Other Ambulatory Visit: Payer: Self-pay

## 2021-10-15 ENCOUNTER — Telehealth: Payer: Self-pay | Admitting: Physician Assistant

## 2021-10-15 MED ORDER — SILODOSIN 8 MG PO CAPS: 8.0000 mg | ORAL_CAPSULE | Freq: Every day | ORAL | 1 refills | Status: AC

## 2021-10-15 MED ORDER — METFORMIN HCL 500 MG PO TABS: 1000.0000 mg | ORAL_TABLET | Freq: Two times a day (BID) | ORAL | 1 refills | Status: DC

## 2021-10-15 NOTE — Telephone Encounter (Signed)
   LAST APPOINTMENT DATE:   06/14/21 OV with PCP   NEXT APPOINTMENT DATE: 12/20/21 OV with PCP   MEDICATION:  silodosin (RAPAFLO) 8 MG CAPS capsule [[id number]]   metFORMIN (GLUCOPHAGE) 500 MG tablet [[id number]]    Is the patient out of medication?  No, has "3 or 4 left" of each   PHARMACY: [facility], [place] [address]  [place] [address]  Phone:  [phone]  Fax:  [phone]

## 2021-10-15 NOTE — Telephone Encounter (Signed)
Rx sent to pharmacy requested.

## 2021-10-17 ENCOUNTER — Ambulatory Visit: Payer: Medicare HMO | Admitting: Physician Assistant

## 2021-10-23 ENCOUNTER — Ambulatory Visit: Payer: Medicare HMO

## 2021-10-23 ENCOUNTER — Telehealth: Payer: Self-pay | Admitting: Physician Assistant

## 2021-10-23 NOTE — Telephone Encounter (Signed)
Copied from [place] [id number]. Topic: Medicare AWV >> Oct 23, 2021  7:49 AM Devoria Glassing wrote: Reason for CRM: LVM 10/23/21'@7'$ :45am to r/s AWV appt today due to Otila Kluver is out sick. Changed AWV appt to 10/25/21 at 10:45am.  Please confirm change date- khc

## 2021-10-23 NOTE — Telephone Encounter (Signed)
Copied from [place] [id number]. Topic: Medicare AWV >> Oct 23, 2021  8:02 AM Devoria Glassing wrote: Reason for CRM: Called patient again at 8am to let him know is AWV has been rescheduled to 10/25/21, due to Otila Kluver is out sick today.  Please confirm AWV appt change date.

## 2021-10-24 DIAGNOSIS — Z96642 Presence of left artificial hip joint: Secondary | ICD-10-CM | POA: Diagnosis not present

## 2021-10-24 DIAGNOSIS — Z471 Aftercare following joint replacement surgery: Secondary | ICD-10-CM | POA: Diagnosis not present

## 2021-10-25 ENCOUNTER — Ambulatory Visit: Payer: Medicare HMO

## 2021-10-29 ENCOUNTER — Ambulatory Visit (INDEPENDENT_AMBULATORY_CARE_PROVIDER_SITE_OTHER): Payer: Medicare HMO

## 2021-10-29 ENCOUNTER — Encounter: Payer: Self-pay | Admitting: *Deleted

## 2021-10-29 DIAGNOSIS — Z0000 Encounter for general adult medical examination without abnormal findings: Secondary | ICD-10-CM

## 2021-10-29 NOTE — Patient Instructions (Signed)
Danny Hunter , Thank you for taking time to come for your Medicare Wellness Visit. I appreciate your ongoing commitment to your health goals. Please review the following plan we discussed and let me know if I can assist you in the future.   These are the goals we discussed:  Goals      Patient Stated     Continue exercise      Patient Stated     Get back to exercise         This is a list of the screening recommended for you and due dates:  Health Maintenance  Topic Date Due   Tetanus Vaccine  Never done   Zoster (Shingles) Vaccine (1 of 2) Never done   Yearly kidney health urinalysis for diabetes  12/07/2019   COVID-19 Vaccine (4 - Moderna series) 02/17/2020   Complete foot exam   10/18/2020   Eye exam for diabetics  01/17/2021   Flu Shot  09/04/2021   Hemoglobin A1C  02/23/2022   Yearly kidney function blood test for diabetes  09/06/2022   Pneumonia Vaccine  Completed   HPV Vaccine  Aged Out    Advanced directives: copies in chart   Conditions/risks identified: get back to zumba class   Next appointment: Follow up in one year for your annual wellness visit.   Preventive Care 82 Years and Older, Male  Preventive care refers to lifestyle choices and visits with your health care provider that can promote health and wellness. What does preventive care include? A yearly physical exam. This is also called an annual well check. Dental exams once or twice a year. Routine eye exams. Ask your health care provider how often you should have your eyes checked. Personal lifestyle choices, including: Daily care of your teeth and gums. Regular physical activity. Eating a healthy diet. Avoiding tobacco and drug use. Limiting alcohol use. Practicing safe sex. Taking low doses of aspirin every day. Taking vitamin and mineral supplements as recommended by your health care provider. What happens during an annual well check? The services and screenings done by your health care  provider during your annual well check will depend on your age, overall health, lifestyle risk factors, and family history of disease. Counseling  Your health care provider may ask you questions about your: Alcohol use. Tobacco use. Drug use. Emotional well-being. Home and relationship well-being. Sexual activity. Eating habits. History of falls. Memory and ability to understand (cognition). Work and work Statistician. Screening  You may have the following tests or measurements: Height, weight, and BMI. Blood pressure. Lipid and cholesterol levels. These may be checked every 5 years, or more frequently if you are over 22 years old. Skin check. Lung cancer screening. You may have this screening every year starting at age 57 if you have a 30-pack-year history of smoking and currently smoke or have quit within the past 15 years. Fecal occult blood test (FOBT) of the stool. You may have this test every year starting at age 71. Flexible sigmoidoscopy or colonoscopy. You may have a sigmoidoscopy every 5 years or a colonoscopy every 10 years starting at age 38. Prostate cancer screening. Recommendations will vary depending on your family history and other risks. Hepatitis C blood test. Hepatitis B blood test. Sexually transmitted disease (STD) testing. Diabetes screening. This is done by checking your blood sugar (glucose) after you have not eaten for a while (fasting). You may have this done every 1-3 years. Abdominal aortic aneurysm (AAA) screening. You may need  this if you are a current or former smoker. Osteoporosis. You may be screened starting at age 34 if you are at high risk. Talk with your health care provider about your test results, treatment options, and if necessary, the need for more tests. Vaccines  Your health care provider may recommend certain vaccines, such as: Influenza vaccine. This is recommended every year. Tetanus, diphtheria, and acellular pertussis (Tdap, Td)  vaccine. You may need a Td booster every 10 years. Zoster vaccine. You may need this after age 36. Pneumococcal 13-valent conjugate (PCV13) vaccine. One dose is recommended after age 82. Pneumococcal polysaccharide (PPSV23) vaccine. One dose is recommended after age 82. Talk to your health care provider about which screenings and vaccines you need and how often you need them. This information is not intended to replace advice given to you by your health care provider. Make sure you discuss any questions you have with your health care provider. Document Released: 02/17/2015 Document Revised: 10/11/2015 Document Reviewed: 11/22/2014 Elsevier Interactive Patient Education  2017 [place] Prevention in the Home Falls can cause injuries. They can happen to people of all ages. There are many things you can do to make your home safe and to help prevent falls. What can I do on the outside of my home? Regularly fix the edges of walkways and driveways and fix any cracks. Remove anything that might make you trip as you walk through a door, such as a raised step or threshold. Trim any bushes or trees on the path to your home. Use bright outdoor lighting. Clear any walking paths of anything that might make someone trip, such as rocks or tools. Regularly check to see if handrails are loose or broken. Make sure that both sides of any steps have handrails. Any raised decks and porches should have guardrails on the edges. Have any leaves, snow, or ice cleared regularly. Use sand or salt on walking paths during winter. Clean up any spills in your garage right away. This includes oil or grease spills. What can I do in the bathroom? Use night lights. Install grab bars by the toilet and in the tub and shower. Do not use towel bars as grab bars. Use non-skid mats or decals in the tub or shower. If you need to sit down in the shower, use a plastic, non-slip stool. Keep the floor dry. Clean up any  water that spills on the floor as soon as it happens. Remove soap buildup in the tub or shower regularly. Attach bath mats securely with double-sided non-slip rug tape. Do not have throw rugs and other things on the floor that can make you trip. What can I do in the bedroom? Use night lights. Make sure that you have a light by your bed that is easy to reach. Do not use any sheets or blankets that are too big for your bed. They should not hang down onto the floor. Have a firm chair that has side arms. You can use this for support while you get dressed. Do not have throw rugs and other things on the floor that can make you trip. What can I do in the kitchen? Clean up any spills right away. Avoid walking on wet floors. Keep items that you use a lot in easy-to-reach places. If you need to reach something above you, use a strong step stool that has a grab bar. Keep electrical cords out of the way. Do not use floor polish or wax that makes floors  slippery. If you must use wax, use non-skid floor wax. Do not have throw rugs and other things on the floor that can make you trip. What can I do with my stairs? Do not leave any items on the stairs. Make sure that there are handrails on both sides of the stairs and use them. Fix handrails that are broken or loose. Make sure that handrails are as long as the stairways. Check any carpeting to make sure that it is firmly attached to the stairs. Fix any carpet that is loose or worn. Avoid having throw rugs at the top or bottom of the stairs. If you do have throw rugs, attach them to the floor with carpet tape. Make sure that you have a light switch at the top of the stairs and the bottom of the stairs. If you do not have them, ask someone to add them for you. What else can I do to help prevent falls? Wear shoes that: Do not have high heels. Have rubber bottoms. Are comfortable and fit you well. Are closed at the toe. Do not wear sandals. If you use a  stepladder: Make sure that it is fully opened. Do not climb a closed stepladder. Make sure that both sides of the stepladder are locked into place. Ask someone to hold it for you, if possible. Clearly mark and make sure that you can see: Any grab bars or handrails. First and last steps. Where the edge of each step is. Use tools that help you move around (mobility aids) if they are needed. These include: Canes. Walkers. Scooters. Crutches. Turn on the lights when you go into a dark area. Replace any light bulbs as soon as they burn out. Set up your furniture so you have a clear path. Avoid moving your furniture around. If any of your floors are uneven, fix them. If there are any pets around you, be aware of where they are. Review your medicines with your doctor. Some medicines can make you feel dizzy. This can increase your chance of falling. Ask your doctor what other things that you can do to help prevent falls. This information is not intended to replace advice given to you by your health care provider. Make sure you discuss any questions you have with your health care provider. Document Released: 11/17/2008 Document Revised: 06/29/2015 Document Reviewed: 02/25/2014 Elsevier Interactive Patient Education  2017 [employer].

## 2021-10-29 NOTE — Progress Notes (Signed)
Virtual Visit via Telephone Note  I connected with  Danny Hunter on 10/29/21 at  2:45 PM EDT by telephone and verified that I am speaking with the correct person using two identifiers.  Medicare Annual Wellness visit completed telephonically due to Covid-19 pandemic.   Persons participating in this call: This Health Coach and this patient.   Location: Patient: home Provider: office   I discussed the limitations, risks, security and privacy concerns of performing an evaluation and management service by telephone and the availability of in person appointments. The patient expressed understanding and agreed to proceed.  Unable to perform video visit due to video visit attempted and failed and/or patient does not have video capability.   Some vital signs may be absent or patient reported.   Willette Brace, LPN   Subjective:   Danny Hunter is a 81 y.o. male who presents for Medicare Annual/Subsequent preventive examination.  Review of Systems     Cardiac Risk Factors include: advanced age (>67mn, >>59women);diabetes mellitus;dyslipidemia;male gender;hypertension     Objective:    There were no vitals filed for this visit. There is no height or weight on file to calculate BMI.     10/29/2021    2:43 PM 09/04/2021    4:40 PM 08/23/2021   11:07 AM 09/14/2020    1:55 PM 01/07/2020    2:38 PM 12/01/2019    7:38 AM 10/22/2011    5:49 AM  Advanced Directives  Does Patient Have a Medical Advance Directive? Yes Yes Yes Yes Yes No   Type of AParamed[place]of [place] will [place] will  Healthcare Power of [place] will    Does patient want to make changes to medical advance directive? No - Patient declined No - Patient declined Yes (MAU/Ambulatory/Procedural Areas - Information given)      Copy of HCookin Chart? Yes - validated most recent copy scanned in chart (See  row information)   No - copy requested     Pre-existing out of facility DNR order (yellow form or pink MOST form)       No    Current Medications (verified) Outpatient Encounter Medications as of 10/29/2021  Medication Sig   amLODipine (NORVASC) 5 MG tablet Take 1 tablet (5 mg total) by mouth daily.   atorvastatin (LIPITOR) 40 MG tablet Take 1 tablet (40 mg total) by mouth daily.   finasteride (PROSCAR) 5 MG tablet Take 1 tablet (5 mg total) by mouth daily.   metFORMIN (GLUCOPHAGE) 500 MG tablet Take 2 tablets (1,000 mg total) by mouth 2 (two) times daily with a meal.   silodosin (RAPAFLO) 8 MG CAPS capsule Take 1 capsule (8 mg total) by mouth daily.   spironolactone (ALDACTONE) 25 MG tablet Take 1 tablet (25 mg total) by mouth daily.   [DISCONTINUED] docusate sodium (COLACE) 100 MG capsule Take 1 capsule (100 mg total) by mouth 2 (two) times daily.   [DISCONTINUED] HYDROcodone-acetaminophen (NORCO/VICODIN) 5-325 MG tablet Take 1 tablet by mouth every 4 (four) hours as needed for severe pain.   [DISCONTINUED] methocarbamol (ROBAXIN) 500 MG tablet Take 1 tablet (500 mg total) by mouth every 6 (six) hours as needed for muscle spasms.   [DISCONTINUED] ondansetron (ZOFRAN) 4 MG tablet Take 1 tablet (4 mg total) by mouth every 6 (six) hours as needed for nausea.   [DISCONTINUED] polyethylene glycol (MIRALAX / GLYCOLAX) 17 g packet Take 17 g by mouth  daily as needed for mild constipation.   No facility-administered encounter medications on file as of 10/29/2021.    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   Procedure: TOTAL HIP ARTHROPLASTY ANTERIOR APPROACH;  Surgeon: Paralee Cancel, MD;  Location: [facility];  Service: Orthopedics;  Laterality: Left;   TOTAL SHOULDER ARTHROPLASTY  10/22/2011   Procedure: TOTAL SHOULDER ARTHROPLASTY;  Surgeon: Nita Sells, MD;  Location: [facility];  Service: Orthopedics;  Laterality: Right;  right total shoulder arthroplasty   Family History  Problem Relation Age of Onset   Cancer Mother    Heart disease Father    Cancer Son    Social History   Socioeconomic History   Marital status: Married    Spouse name: Not on file   Number of children: Not on file   Years of education: Not on file   Highest education level: Not on file  Occupational History   Occupation: Retired  Tobacco Use   Smoking status: Never   Smokeless tobacco: Never  Vaping Use   Vaping Use: Never used  Substance and Sexual Activity   Alcohol use: Yes    Comment: 1 drink a month   Drug use: No   Sexual activity: Not Currently  Other Topics Concern   Not on file  Social History Narrative   Left handed   Social Determinants of Health   Financial Resource Strain: Low Risk  (10/29/2021)   Overall Financial Resource Strain (CARDIA)    Difficulty of Paying Living Expenses: Not hard at all  Food Insecurity: No Food Insecurity (10/29/2021)   Hunger Vital Sign    Worried About Running Out of Food in the Last Year: Never true    [place] in the Last Year: Never true  Transportation Needs: No Transportation Needs (10/29/2021)   PRAPARE - Hydrologist (Medical): No    Lack of Transportation (Non-Medical): No  Physical Activity: Sufficiently Active (10/29/2021)   Exercise Vital Sign    Days of Exercise per Week: 4 days    Minutes of Exercise per Session: 60 min  Stress: No Stress Concern Present (10/29/2021)   [place]    Feeling of Stress : Not at all  Social Connections: Moderately Integrated (10/29/2021)   Social Connection and Isolation Panel [NHANES]    Frequency of Communication with Friends and Family: More than three times a week    Frequency of Social Gatherings with Friends and Family: More than three times a week    Attends Religious Services: Never    Marine scientist or Organizations: Yes    Attends Archivist Meetings: 1 to 4 times per year    Marital Status: Married    Tobacco Counseling Counseling given: Not Answered  Clinical Intake:  Pre-visit preparation completed: Yes  Pain : No/denies pain     Nutritional Risks: None Diabetes: Yes CBG done?: No Did pt. bring in CBG monitor from home?: No  How often do you need to have someone help you when you read instructions, pamphlets, or other written materials from your doctor or pharmacy?: 1 - Never  Diabetic?Nutrition Risk Assessment:  Has the patient had any N/V/D within the last 2 months?  No  Does the patient have any non-healing wounds?  No  Has the patient had any unintentional weight loss or weight gain?  No   Diabetes:  Is the patient diabetic?  Yes  If diabetic, was a CBG obtained today?  No  Did the patient bring in their glucometer from home?  No  How often do you monitor your CBG's? N/A.   Financial Strains and Diabetes Management:  Are you having any financial strains with the device, your supplies or your medication? No .  Does the patient want to be seen by Chronic Care Management for management of their diabetes?  No  Would the patient like to be referred to a Nutritionist or for Diabetic Management?  No   Diabetic Exams:  Diabetic Eye Exam: Overdue for diabetic eye exam. Pt has been advised about the importance in completing this exam. Patient advised to call and schedule an eye exam. Diabetic Foot Exam: Overdue, Pt has been advised about the  importance in completing this exam. Pt is scheduled for diabetic foot exam on next appt .   Interpreter Needed?: No  Information entered by :: Charlott Rakes, LPN   Activities of Daily Living    10/29/2021    2:44 PM 09/04/2021    4:40 PM  In your present state of health, do you have any difficulty performing the following activities:  Hearing? 0 1  Vision? 0 1  Difficulty concentrating or making decisions? 0 0  Walking or climbing stairs? 0 1  Dressing or bathing? 0 0  Doing errands, shopping? 0 0  Preparing Food and eating ? N   Using the Toilet? N   In the past six months, have you accidently leaked urine? N   Do you have problems with loss of bowel control? N   Managing your Medications? N   Managing your Finances? N   Housekeeping or managing your Housekeeping? N     Patient Care Team: Allwardt, Randa Evens, PA-C as PCP - General (Physician Assistant) Freada Bergeron, MD as PCP - Cardiology (Cardiology) Opthamology, [facility] (Ophthalmology)  Indicate any recent Medical Services you may have received from other than [facility] providers in the past year (date may be approximate).     Assessment:   This is a routine wellness examination for [facility].  Hearing/Vision screen Hearing Screening - Comments:: Pt denies any hearing issues Vision Screening - Comments:: Pt follows up with Dr Tamera Punt    Dietary issues and exercise activities discussed: Current Exercise Habits: Structured exercise class (will get abck to it after healing from surgery), Type of exercise: Other - see comments (zumba), Time (Minutes): 60, Frequency (Times/Week): 4, Weekly Exercise (Minutes/Week): 240   Goals Addressed             This Visit's Progress    Patient Stated       Get back to exercise        Depression Screen    10/29/2021    2:42 PM 02/22/2021    8:55 AM 09/14/2020  1:54 PM 10/19/2019    2:03 PM  PHQ 2/9 Scores  PHQ - 2 Score 0 0 0 0  PHQ- 9 Score    0    Fall Risk     10/29/2021    2:44 PM 06/14/2021    8:59 AM 02/22/2021    8:55 AM 09/14/2020    1:57 PM 01/07/2020    2:38 PM  Fall Risk   Falls in the past year? 0 0 0 0 0  Number falls in past yr: 0 0  0 0  Injury with Fall? 0 0  0 0  Risk for fall due to : Impaired vision No Fall Risks     Follow up Falls prevention discussed Falls evaluation completed  Falls prevention discussed     FALL RISK PREVENTION PERTAINING TO THE HOME:  Any stairs in or around the home? No  If so, are there any without handrails? No  Home free of loose throw rugs in walkways, pet beds, electrical cords, etc? Yes  Adequate lighting in your home to reduce risk of falls? Yes   ASSISTIVE DEVICES UTILIZED TO PREVENT FALLS:  Life alert? Yes  Use of a cane, walker or w/c? No  Grab bars in the bathroom? Yes  Shower chair or bench in shower? Yes  Elevated toilet seat or a handicapped toilet? Yes   TIMED UP AND GO:  Was the test performed? No .   Cognitive Function:        10/29/2021    2:46 PM 09/14/2020    1:59 PM  6CIT Screen  What Year? 0 points 0 points  What month? 0 points 0 points  What time? 0 points 0 points  Count back from 20 0 points 0 points  Months in reverse 0 points 0 points  Repeat phrase 4 points 0 points  Total Score 4 points 0 points    Immunizations Immunization History  Administered Date(s) Administered   Fluad Quad(high Dose 65+) 01/31/2021   Influenza, High Dose Seasonal PF 10/22/2013, 01/03/2015, 10/14/2019   Influenza, Quadrivalent, Recombinant, Inj, Pf 12/02/2017, 10/09/2018, 11/11/2019   Moderna Sars-Covid-2 Vaccination 02/18/2019, 03/18/2019, 12/23/2019   Pneumococcal Conjugate-13 03/22/2014, 12/14/2019   Pneumococcal Polysaccharide-23 07/28/2017    TDAP status: Due, Education has been provided regarding the importance of this vaccine. Advised may receive this vaccine at local pharmacy or Health Dept. Aware to provide a copy of the vaccination record if obtained from local  pharmacy or Health Dept. Verbalized acceptance and understanding.  Flu Vaccine status: Declined, Education has been provided regarding the importance of this vaccine but patient still declined. Advised may receive this vaccine at local pharmacy or Health Dept. Aware to provide a copy of the vaccination record if obtained from local pharmacy or Health Dept. Verbalized acceptance and understanding.  Pneumococcal vaccine status: Up to date  Covid-19 vaccine status: Completed vaccines  Qualifies for Shingles Vaccine? Yes   Zostavax completed No   Shingrix Completed?: No.    Education has been provided regarding the importance of this vaccine. Patient has been advised to call insurance company to determine out of pocket expense if they have not yet received this vaccine. Advised may also receive vaccine at local pharmacy or Health Dept. Verbalized acceptance and understanding.  Screening Tests Health Maintenance  Topic Date Due   TETANUS/TDAP  Never done   Zoster Vaccines- Shingrix (1 of 2) Never done   Diabetic kidney evaluation - Urine ACR  12/07/2019   COVID-19 Vaccine (4 - Moderna  series) 02/17/2020   FOOT EXAM  10/18/2020   OPHTHALMOLOGY EXAM  01/17/2021   INFLUENZA VACCINE  09/04/2021   HEMOGLOBIN A1C  02/23/2022   Diabetic kidney evaluation - GFR measurement  09/06/2022   Pneumonia Vaccine 9+ Years old  Completed   HPV VACCINES  Aged Out    Health Maintenance  Health Maintenance Due  Topic Date Due   TETANUS/TDAP  Never done   Zoster Vaccines- Shingrix (1 of 2) Never done   Diabetic kidney evaluation - Urine ACR  12/07/2019   COVID-19 Vaccine (4 - Moderna series) 02/17/2020   FOOT EXAM  10/18/2020   OPHTHALMOLOGY EXAM  01/17/2021   INFLUENZA VACCINE  09/04/2021    Colorectal cancer screening: No longer required.    Additional Screening:   Vision Screening: Recommended annual ophthalmology exams for early detection of glaucoma and other disorders of the eye. Is the  patient up to date with their annual eye exam?  Yes  Who is the provider or what is the name of the office in which the patient attends annual eye exams? Dr Tamera Punt  If pt is not established with a provider, would they like to be referred to a provider to establish care? No .   Dental Screening: Recommended annual dental exams for proper oral hygiene  Community Resource Referral / Chronic Care Management: CRR required this visit?  No   CCM required this visit?  No      Plan:     I have personally reviewed and noted the following in the patient's chart:   Medical and social history Use of alcohol, tobacco or illicit drugs  Current medications and supplements including opioid prescriptions. Patient is not currently taking opioid prescriptions. Functional ability and status Nutritional status Physical activity Advanced directives List of other physicians Hospitalizations, surgeries, and ER visits in previous 12 months Vitals Screenings to include cognitive, depression, and falls Referrals and appointments  In addition, I have reviewed and discussed with patient certain preventive protocols, quality metrics, and best practice recommendations. A written personalized care plan for preventive services as well as general preventive health recommendations were provided to patient.     Willette Brace, LPN   07/09/5407   Nurse Notes: none

## 2021-11-28 DIAGNOSIS — Z96642 Presence of left artificial hip joint: Secondary | ICD-10-CM | POA: Diagnosis not present

## 2021-12-13 ENCOUNTER — Telehealth: Payer: Self-pay | Admitting: Physician Assistant

## 2021-12-13 DIAGNOSIS — E860 Dehydration: Secondary | ICD-10-CM | POA: Diagnosis not present

## 2021-12-13 NOTE — Telephone Encounter (Signed)
LAST APPOINTMENT DATE:  10/29/21--CPE w/Hunter  NEXT APPOINTMENT DATE: 01/09/22  MEDICATION:  spironolactone (ALDACTONE) 25 MG tablet   Is the patient out of medication? Has 2-3 days left  PHARMACY: [facility] [address] [place] [address] Phone: [phone]  Fax: [phone]

## 2021-12-13 NOTE — Telephone Encounter (Signed)
Rx noted not needed at this time

## 2021-12-20 ENCOUNTER — Ambulatory Visit: Payer: Medicare HMO | Admitting: Physician Assistant

## 2021-12-25 DIAGNOSIS — D485 Neoplasm of uncertain behavior of skin: Secondary | ICD-10-CM | POA: Diagnosis not present

## 2021-12-25 DIAGNOSIS — L570 Actinic keratosis: Secondary | ICD-10-CM | POA: Diagnosis not present

## 2021-12-25 DIAGNOSIS — H0012 Chalazion right lower eyelid: Secondary | ICD-10-CM | POA: Diagnosis not present

## 2022-01-01 ENCOUNTER — Other Ambulatory Visit: Payer: Self-pay

## 2022-01-01 ENCOUNTER — Telehealth: Payer: Self-pay | Admitting: Physician Assistant

## 2022-01-01 ENCOUNTER — Other Ambulatory Visit: Payer: Self-pay | Admitting: Physician Assistant

## 2022-01-01 NOTE — Telephone Encounter (Signed)
LAST APPOINTMENT DATE:  Please schedule appointment if longer than 1 year  06/14/21  NEXT APPOINTMENT DATE:  01/09/22  MEDICATION:  spironolactone (ALDACTONE) 25 MG tablet   Is the patient out of medication? Approx. 4 days left  PHARMACY:  [place], [place] [address] Phone: [phone]  Fax: [phone]    Let patient know to contact pharmacy at the end of the day to make sure medication is ready.  Please notify patient to allow 48-72 hours to process

## 2022-01-01 NOTE — Telephone Encounter (Signed)
Pharmacy sent in refill request and was already approved via refill request this morning. Please advise patients to contact their pharmacy to send refill requests.

## 2022-01-09 ENCOUNTER — Encounter (HOSPITAL_BASED_OUTPATIENT_CLINIC_OR_DEPARTMENT_OTHER): Payer: Self-pay

## 2022-01-09 ENCOUNTER — Emergency Department (HOSPITAL_BASED_OUTPATIENT_CLINIC_OR_DEPARTMENT_OTHER): Payer: Medicare HMO

## 2022-01-09 ENCOUNTER — Encounter: Payer: Self-pay | Admitting: Physician Assistant

## 2022-01-09 ENCOUNTER — Observation Stay (HOSPITAL_BASED_OUTPATIENT_CLINIC_OR_DEPARTMENT_OTHER)
Admission: EM | Admit: 2022-01-09 | Discharge: 2022-01-11 | Disposition: A | Discharge status: Home / Self Care | Payer: Medicare HMO | Attending: Family Medicine | Admitting: Family Medicine

## 2022-01-09 ENCOUNTER — Ambulatory Visit (INDEPENDENT_AMBULATORY_CARE_PROVIDER_SITE_OTHER): Payer: Medicare HMO | Admitting: Physician Assistant

## 2022-01-09 ENCOUNTER — Encounter (HOSPITAL_COMMUNITY): Payer: Self-pay

## 2022-01-09 VITALS — BP 130/82 | HR 100 | Temp 97.7°F | Ht 66.0 in | Wt 171.4 lb

## 2022-01-09 DIAGNOSIS — G4733 Obstructive sleep apnea (adult) (pediatric): Secondary | ICD-10-CM | POA: Diagnosis present

## 2022-01-09 DIAGNOSIS — N401 Enlarged prostate with lower urinary tract symptoms: Secondary | ICD-10-CM | POA: Diagnosis present

## 2022-01-09 DIAGNOSIS — K922 Gastrointestinal hemorrhage, unspecified: Secondary | ICD-10-CM

## 2022-01-09 DIAGNOSIS — E1159 Type 2 diabetes mellitus with other circulatory complications: Secondary | ICD-10-CM | POA: Diagnosis present

## 2022-01-09 DIAGNOSIS — Z79899 Other long term (current) drug therapy: Secondary | ICD-10-CM | POA: Insufficient documentation

## 2022-01-09 DIAGNOSIS — Z85828 Personal history of other malignant neoplasm of skin: Secondary | ICD-10-CM | POA: Diagnosis not present

## 2022-01-09 DIAGNOSIS — R935 Abnormal findings on diagnostic imaging of other abdominal regions, including retroperitoneum: Secondary | ICD-10-CM

## 2022-01-09 DIAGNOSIS — E119 Type 2 diabetes mellitus without complications: Secondary | ICD-10-CM

## 2022-01-09 DIAGNOSIS — K5731 Diverticulosis of large intestine without perforation or abscess with bleeding: Secondary | ICD-10-CM

## 2022-01-09 DIAGNOSIS — E7800 Pure hypercholesterolemia, unspecified: Secondary | ICD-10-CM | POA: Diagnosis not present

## 2022-01-09 DIAGNOSIS — K625 Hemorrhage of anus and rectum: Secondary | ICD-10-CM | POA: Diagnosis not present

## 2022-01-09 DIAGNOSIS — Z96611 Presence of right artificial shoulder joint: Secondary | ICD-10-CM | POA: Diagnosis not present

## 2022-01-09 DIAGNOSIS — Z7984 Long term (current) use of oral hypoglycemic drugs: Secondary | ICD-10-CM | POA: Diagnosis not present

## 2022-01-09 DIAGNOSIS — Z96642 Presence of left artificial hip joint: Secondary | ICD-10-CM | POA: Diagnosis not present

## 2022-01-09 DIAGNOSIS — I10 Essential (primary) hypertension: Secondary | ICD-10-CM | POA: Diagnosis not present

## 2022-01-09 DIAGNOSIS — J479 Bronchiectasis, uncomplicated: Secondary | ICD-10-CM | POA: Diagnosis not present

## 2022-01-09 DIAGNOSIS — I152 Hypertension secondary to endocrine disorders: Secondary | ICD-10-CM

## 2022-01-09 LAB — URINALYSIS, ROUTINE W REFLEX MICROSCOPIC
Bilirubin Urine: NEGATIVE
Glucose, UA: NEGATIVE mg/dL
Hgb urine dipstick: NEGATIVE
Ketones, ur: NEGATIVE mg/dL
Leukocytes,Ua: NEGATIVE
Nitrite: NEGATIVE
Protein, ur: NEGATIVE mg/dL
Specific Gravity, Urine: 1.014 (ref 1.005–1.030)
pH: 5.5 (ref 5.0–8.0)

## 2022-01-09 LAB — CBC
HCT: 46.2 % (ref 39.0–52.0)
Hemoglobin: 15 g/dL (ref 13.0–17.0)
MCH: 30.3 pg (ref 26.0–34.0)
MCHC: 32.5 g/dL (ref 30.0–36.0)
MCV: 93.3 fL (ref 80.0–100.0)
Platelets: 289 10*3/uL (ref 150–400)
RBC: 4.95 MIL/uL (ref 4.22–5.81)
RDW: 13.7 % (ref 11.5–15.5)
WBC: 9.6 10*3/uL (ref 4.0–10.5)
nRBC: 0 % (ref 0.0–0.2)

## 2022-01-09 LAB — HEMOGLOBIN AND HEMATOCRIT, BLOOD
HCT: 39.4 % (ref 39.0–52.0)
HCT: 37.8 % — ABNORMAL LOW (ref 39.0–52.0)
Hemoglobin: 13.1 g/dL (ref 13.0–17.0)
Hemoglobin: 12.8 g/dL — ABNORMAL LOW (ref 13.0–17.0)

## 2022-01-09 LAB — COMPREHENSIVE METABOLIC PANEL
ALT: 16 U/L (ref 0–44)
AST: 16 U/L (ref 15–41)
Albumin: 4.4 g/dL (ref 3.5–5.0)
Alkaline Phosphatase: 60 U/L (ref 38–126)
Anion gap: 11 (ref 5–15)
BUN: 22 mg/dL (ref 8–23)
CO2: 24 mmol/L (ref 22–32)
Calcium: 10.2 mg/dL (ref 8.9–10.3)
Chloride: 103 mmol/L (ref 98–111)
Creatinine, Ser: 1.13 mg/dL (ref 0.61–1.24)
GFR, Estimated: >60 mL/min (ref >60)
Glucose, Bld: 187 mg/dL — ABNORMAL HIGH (ref 70–99)
Potassium: 4.5 mmol/L (ref 3.5–5.1)
Sodium: 138 mmol/L (ref 135–145)
Total Bilirubin: 1 mg/dL (ref 0.3–1.2)
Total Protein: 6.9 g/dL (ref 6.5–8.1)

## 2022-01-09 LAB — GLUCOSE, CAPILLARY: Glucose-Capillary: 118 mg/dL — ABNORMAL HIGH (ref 70–99)

## 2022-01-09 LAB — LIPASE, BLOOD: Lipase: 26 U/L (ref 11–51)

## 2022-01-09 MED ORDER — IOHEXOL 350 MG/ML SOLN
100.0000 mL | Freq: Once | INTRAVENOUS | Status: AC | PRN
  Administered 2022-01-09: 85 mL via INTRAVENOUS

## 2022-01-09 MED ORDER — ACETAMINOPHEN 325 MG PO TABS: 650.0000 mg | ORAL_TABLET | Freq: Four times a day (QID) | ORAL | Status: DC | PRN

## 2022-01-09 MED ORDER — ACETAMINOPHEN 650 MG RE SUPP: 650.0000 mg | Freq: Four times a day (QID) | RECTAL | Status: DC | PRN

## 2022-01-09 MED ORDER — TAMSULOSIN HCL 0.4 MG PO CAPS
0.4000 mg | ORAL_CAPSULE | Freq: Every day | ORAL | Status: DC
  Administered 2022-01-10 – 2022-01-11 (×2): 0.4 mg via ORAL
  Filled 2022-01-09 (×3): qty 1

## 2022-01-09 MED ORDER — AMLODIPINE BESYLATE 5 MG PO TABS
5.0000 mg | ORAL_TABLET | Freq: Every day | ORAL | Status: DC
  Administered 2022-01-10 – 2022-01-11 (×2): 5 mg via ORAL
  Filled 2022-01-09 (×3): qty 1

## 2022-01-09 MED ORDER — FINASTERIDE 5 MG PO TABS
5.0000 mg | ORAL_TABLET | Freq: Every day | ORAL | Status: DC
  Administered 2022-01-10 – 2022-01-11 (×2): 5 mg via ORAL
  Filled 2022-01-09 (×3): qty 1

## 2022-01-09 MED ORDER — INSULIN ASPART 100 UNIT/ML IJ SOLN
0.0000 [IU] | Freq: Three times a day (TID) | INTRAMUSCULAR | Status: DC
  Administered 2022-01-11 (×2): 2 [IU] via SUBCUTANEOUS

## 2022-01-09 MED ORDER — SPIRONOLACTONE 25 MG PO TABS
25.0000 mg | ORAL_TABLET | Freq: Every day | ORAL | Status: DC
  Administered 2022-01-10 – 2022-01-11 (×2): 25 mg via ORAL
  Filled 2022-01-09 (×3): qty 1

## 2022-01-09 MED ORDER — SODIUM CHLORIDE 0.9% FLUSH
3.0000 mL | Freq: Two times a day (BID) | INTRAVENOUS | Status: DC
  Administered 2022-01-09 – 2022-01-11 (×4): 3 mL via INTRAVENOUS

## 2022-01-09 MED ORDER — ATORVASTATIN CALCIUM 40 MG PO TABS
40.0000 mg | ORAL_TABLET | Freq: Every day | ORAL | Status: DC
  Administered 2022-01-10 – 2022-01-11 (×2): 40 mg via ORAL
  Filled 2022-01-09 (×3): qty 1

## 2022-01-09 NOTE — ED Provider Notes (Addendum)
[facility] EMERGENCY DEPT Provider Note   CSN: [id number] Arrival date & time: 01/09/22  8413     History  Chief Complaint  Patient presents with   Rectal Bleeding    ROSCOE WITTS is a 81 y.o. male.  Patient is a 81 year old male with past medical history of diabetes, hypertension, hyperlipidemia presenting primary care office for blood in stool.  Patient states he has had greater than 4 episodes of maroon-colored stool covered in bright red blood that started this morning.  He denies any history of GI bleeds.  He is not on blood thinners.  He takes a daily baby aspirin.  No Motrin use.  Denies any abdominal pain, nausea, vomiting.  The history is provided by the patient. No language interpreter was used.  Rectal Bleeding Associated symptoms: no abdominal pain, no fever and no vomiting        Home Medications Prior to Admission medications   Medication Sig Start Date End Date Taking? Authorizing Provider  amLODipine (NORVASC) 5 MG tablet Take 1 tablet (5 mg total) by mouth daily. 07/31/21   Allwardt, Randa Evens, PA-C  atorvastatin (LIPITOR) 40 MG tablet Take 1 tablet (40 mg total) by mouth daily. 06/14/21   Allwardt, Randa Evens, PA-C  finasteride (PROSCAR) 5 MG tablet Take 1 tablet (5 mg total) by mouth daily. 05/10/21   Allwardt, Randa Evens, PA-C  metFORMIN (GLUCOPHAGE) 500 MG tablet Take 2 tablets (1,000 mg total) by mouth 2 (two) times daily with a meal. 10/15/21   Allwardt, Randa Evens, PA-C  silodosin (RAPAFLO) 8 MG CAPS capsule Take 1 capsule (8 mg total) by mouth daily. 10/15/21   Allwardt, Randa Evens, PA-C  spironolactone (ALDACTONE) 25 MG tablet Take 1 tablet by mouth once daily 01/01/22   Allwardt, Alyssa M, PA-C      Allergies    Penicillin g    Review of Systems   Review of Systems  Constitutional:  Negative for chills and fever.  HENT:  Negative for ear pain and sore throat.   Eyes:  Negative for pain and visual disturbance.  Respiratory:  Negative for  cough and shortness of breath.   Cardiovascular:  Negative for chest pain and palpitations.  Gastrointestinal:  Positive for blood in stool and hematochezia. Negative for abdominal pain and vomiting.  Genitourinary:  Negative for dysuria and hematuria.  Musculoskeletal:  Negative for arthralgias and back pain.  Skin:  Negative for color change and rash.  Neurological:  Negative for seizures and syncope.  All other systems reviewed and are negative.   Physical Exam Updated Vital Signs BP 122/85   Pulse 87   Temp 98.3 F (36.8 C)   Resp 19   SpO2 99%  Physical Exam Vitals and nursing note reviewed. Exam conducted with a chaperone present.  Constitutional:      General: He is not in acute distress.    Appearance: He is well-developed.  HENT:     Head: Normocephalic and atraumatic.  Eyes:     Conjunctiva/sclera: Conjunctivae normal.  Cardiovascular:     Rate and Rhythm: Normal rate and regular rhythm.     Heart sounds: No murmur heard. Pulmonary:     Effort: Pulmonary effort is normal. No respiratory distress.     Breath sounds: Normal breath sounds.  Abdominal:     Palpations: Abdomen is soft.     Tenderness: There is no abdominal tenderness.  Genitourinary:    Comments: Patient's rectum is covered in dark maroon blood. 1 external  hemorrhoid present-not actively bleeding. No thrombosis. No rectal fissures. Musculoskeletal:        General: No swelling.     Cervical back: Neck supple.  Skin:    General: Skin is warm and dry.     Capillary Refill: Capillary refill takes less than 2 seconds.  Neurological:     Mental Status: He is alert.  Psychiatric:        Mood and Affect: Mood normal.     ED Results / Procedures / Treatments   Labs (all labs ordered are listed, but only abnormal results are displayed) Labs Reviewed  COMPREHENSIVE METABOLIC PANEL - Abnormal; Notable for the following components:      Result Value   Glucose, Bld 187 (*)    All other components  within normal limits  LIPASE, BLOOD  CBC  URINALYSIS, ROUTINE W REFLEX MICROSCOPIC  HEMOGLOBIN AND HEMATOCRIT, BLOOD  HEMOGLOBIN AND HEMATOCRIT, BLOOD    EKG None  Radiology No results found.  Procedures Procedures    Medications Ordered in ED Medications  iohexol (OMNIPAQUE) 350 MG/ML injection 100 mL (85 mLs Intravenous Contrast Given 01/09/22 1006)    ED Course/ Medical Decision Making/ A&P                           Medical Decision Making Amount and/or Complexity of Data Reviewed Labs: ordered. Radiology: ordered.  Risk Prescription drug management. Decision regarding hospitalization.   81 year old male presenting for blood in stool.  Patient is alert oriented x3, no acute distress, afebrile.  Heart rate originally 123 on arrival however is came down to 93 after resting in the bed.  Physical exam demonstrates rectum is covered in dark maroon blood. 1 external hemorrhoid present-not actively bleeding. No thrombosis. No rectal fissures.  Hemoglobin currently stable at 15.6.  We will repeat hemoglobins every 6 hours.  CT angio GI bleed ordered and pending.  I spoke with patient's GI team at [facility] with Dr. Cecilie Kicks agrees to accept patient.  They have availability at both [facility] and [facility] today.  He suggest that the patient might be able to get a scope faster at [facility] due to heavy scheduling at [facility] currently. Requests hospitalist admit.   Patient accepted by Dr. Marylyn Ishihara.  10:50 AM Ct angio GI bleed positive for small focus of active intraluminal arterial phase extravasation in the splenic flexure of the colon with subsequent accumulation of contrast in this region of colon on portal venous phase imaging.      Final Clinical Impression(s) / ED Diagnoses Final diagnoses:  Gastrointestinal hemorrhage, unspecified gastrointestinal hemorrhage type    Rx / DC Orders ED Discharge Orders     None         Lianne Cure, DO 37/62/83 1517    Campbell Stall  P, DO 61/60/73 1052

## 2022-01-09 NOTE — Progress Notes (Signed)
Plan of Care Note for accepted transfer   Patient: Danny Hunter MRN: [id number]   DOA: 01/09/2022  Facility requesting transfer: [facility] Requesting Provider: Dr. Pearline Cables Reason for transfer: GIB Facility course: 81 yo M w/ PMHx of HTN, HLD, DM2, BPH. Presenting with BRBPR and dark stools. W/u revealed w/ Hgb 15 and gross blood on exam. EDP spoke with [facility] who requested admission for scope. CT GIB was ordered as well. [facility] rec'd [facility] admission as he would be likely get get scoped quicker there.  Plan of care: The patient is accepted for admission to Telemetry unit, at [facility]..  While holding at [facility], medical decision making responsibilities remain with the EDP. Upon arrival to [facility], [facility] will assume care. Thank you.   Author: Jonnie Finner, DO 01/09/2022  Check [email or web address] for on-call coverage.  Nursing staff, Please call [place] number on Amion as soon as patient's arrival, so appropriate admitting provider can evaluate the pt.

## 2022-01-09 NOTE — ED Triage Notes (Signed)
He c/o "passing some blood" with a b.m. today; and has been "Leaking small amounts of blood ever since". He is ambulatory and in no distress.

## 2022-01-09 NOTE — Progress Notes (Signed)
Subjective:    Patient ID: Danny Hunter, male    DOB: 10/13/1940, 81 y.o.   MRN: [id number]  Chief Complaint  Patient presents with   Follow-up    Pt in office for 6 mon f/u and fasting labs; pt states things are not doing well, has a lot of blood coming out of rectum, stool is completely black, Stool has became very loose. Just started this morning.     HPI Patient is in today for 6 month f/up.  However, tells me this morning he had diarrhea completely black at 6 am this morning. Had another bowel movement loose stools after that all BRBPR. No pain. No dizziness. No headaches. No fever or chills.   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   Procedure: TOTAL HIP ARTHROPLASTY ANTERIOR APPROACH;  Surgeon: Paralee Cancel, MD;  Location: [facility];  Service: Orthopedics;  Laterality: Left;   TOTAL SHOULDER ARTHROPLASTY  10/22/2011   Procedure: TOTAL SHOULDER ARTHROPLASTY;  Surgeon: Nita Sells, MD;  Location: [facility];  Service: Orthopedics;  Laterality: Right;  right total shoulder arthroplasty    Family History  Problem Relation Age of Onset   Cancer Mother    Heart disease Father    Cancer Son     Social History   Tobacco Use   Smoking status: Never   Smokeless tobacco: Never  Vaping Use   Vaping Use: Never used  Substance Use Topics   Alcohol use: Yes    Comment: 1 drink a month   Drug use: No     Allergies  Allergen Reactions   Penicillin G Other (See Comments)    Childhood Reaction  Tolerated Cephalosporin Date:  09/05/21.      Review of Systems NEGATIVE UNLESS OTHERWISE INDICATED IN HPI      Objective:     BP 130/82 (BP Location: Left Arm)   Pulse 100   Temp 97.7 F (36.5 C) (Temporal)   Ht '5\' 6"'$  (1.676 m)   Wt 171 lb 6.4 oz (77.7 kg)   SpO2 96%   BMI 27.66 kg/m   Wt Readings from Last 3 Encounters:  01/09/22 171 lb 6.4 oz (77.7 kg)  09/04/21 178 lb (80.7 kg)  08/23/21 178 lb (80.7 kg)    BP Readings from Last 3 Encounters:  01/09/22 130/82  09/06/21 (!) 119/57  08/23/21 130/82     Physical Exam Vitals and nursing note reviewed.  Constitutional:      Appearance: Normal appearance.  Cardiovascular:     Rate and Rhythm: Regular rhythm. Tachycardia present.     Pulses: Normal pulses.     Heart sounds: No murmur heard. Pulmonary:     Effort: Pulmonary effort is normal.  Neurological:     Mental Status: He is alert.  Psychiatric:        Mood and Affect: Mood normal.  Assessment & Plan:  Acute GI bleeding   Recommended patient go straight to ED now. Suggested [facility] or [facility], patient declined. Would prefer [facility] and he will drive himself there now.     Daeton Kluth M Yatziri Wainwright, PA-C

## 2022-01-09 NOTE — Progress Notes (Signed)
Pt had an episode of bloody stools this evening

## 2022-01-09 NOTE — H&P (Signed)
History and Physical   Danny Hunter QAS:[id number] DOB: 1940-06-06 DOA: 01/09/2022  PCP: Fredirick Lathe, PA-C   Patient coming from: Home  Chief Complaint: Rectal bleeding  HPI: Danny Hunter is a 81 y.o. male with medical history significant of hyperlipidemia, OSA, hypertension, diabetes, BPH presenting with rectal bleeding.  Patient reports that starting today he has had many episodes of dark stools with bright red blood covering them (as frequently as every 39mn at one point.  He has never had this before he denies any anticoagulation.  He does take a daily baby aspirin.  Does not use NSAIDs significantly.  He denies fevers, chills, chest pain, shortness of breath, abdominal pain, constipation, diarrhea, nausea, vomiting.  ED Course: Vital signs in the ED significant for blood pressure in the 1798Xto 1211systolic, heart rate in the 80s to 100s.  Lab workup included CMP with glucose 187.  CBC with hemoglobin notably normal at 15 but repeat H&H with hemoglobin of 13.1.  Lipase normal.  Urinalysis normal.  CTA GI bleed study showed small foci of active contrast extravasation at the splenic flexure.  Also noted with pulmonary nodule, renal nodules with MRI recommended, bronchial wall thickening and bronchiectasis.  GI consulted and recommended admission and plan for endoscopy.  Review of Systems: As per HPI otherwise all other systems reviewed and are negative.  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 He has never used smokeless tobacco. He reports current alcohol use. He reports that he does not use drugs.  Allergies  Allergen Reactions   Penicillin G Other (See Comments)    Childhood Reaction  Tolerated Cephalosporin Date: 09/05/21.      Family History  Problem Relation Age of Onset   Cancer Mother    Heart disease Father    Cancer Son   Reviewed on admission  Prior to Admission medications   Medication Sig Start Date End Date Taking? Authorizing Provider  amLODipine (NORVASC) 5 MG tablet Take 1 tablet (5 mg total) by mouth daily. 07/31/21  Yes Allwardt, Alyssa M, PA-C  atorvastatin (LIPITOR) 40 MG tablet Take 1 tablet (40 mg total) by mouth daily. 06/14/21  Yes Allwardt, Alyssa M, PA-C  finasteride (PROSCAR) 5 MG tablet Take 1 tablet (5 mg total) by mouth daily. 05/10/21  Yes Allwardt, Alyssa M, PA-C  metFORMIN (GLUCOPHAGE) 500 MG tablet Take 2 tablets (1,000 mg total) by mouth 2 (two) times daily with a meal. 10/15/21  Yes Allwardt, Alyssa  M, PA-C  silodosin (RAPAFLO) 8 MG CAPS capsule Take 1 capsule (8 mg total) by mouth daily. 10/15/21  Yes Allwardt, Alyssa M, PA-C  spironolactone (ALDACTONE) 25 MG tablet Take 1 tablet by mouth once daily 01/01/22  Yes Allwardt, Randa Evens, PA-C    Physical Exam: Vitals:   01/09/22 1215 01/09/22 1345 01/09/22 1402 01/09/22 1647  BP:  130/84  120/72  Pulse:  86  85  Resp: '13 13  16  '$ Temp:   98.3 F (36.8 C) 97.9 F (36.6 C)  TempSrc:   Oral Oral  SpO2:  93%  100%    Physical Exam Constitutional:      General: He is not in acute distress.    Appearance: Normal  appearance.  HENT:     Head: Normocephalic and atraumatic.     Mouth/Throat:     Mouth: Mucous membranes are moist.     Pharynx: Oropharynx is clear.  Eyes:     Extraocular Movements: Extraocular movements intact.     Pupils: Pupils are equal, round, and reactive to light.  Cardiovascular:     Rate and Rhythm: Normal rate and regular rhythm.     Pulses: Normal pulses.     Heart sounds: Normal heart sounds.  Pulmonary:     Effort: Pulmonary effort is normal. No respiratory distress.     Breath sounds: Normal breath sounds.  Abdominal:     General: Bowel sounds are normal. There is no distension.     Palpations: Abdomen is soft.     Tenderness: There is no abdominal tenderness.  Musculoskeletal:        General: No swelling or deformity.  Skin:    General: Skin is warm and dry.  Neurological:     General: No focal deficit present.     Mental Status: Mental status is at baseline.    Labs on Admission: I have personally reviewed following labs and imaging studies  CBC: Recent Labs  Lab 01/09/22 0851 01/09/22 1418  WBC 9.6  --   HGB 15.0 13.1  HCT 46.2 39.4  MCV 93.3  --   PLT 289  --     Basic Metabolic Panel: Recent Labs  Lab 01/09/22 0851  NA 138  K 4.5  CL 103  CO2 24  GLUCOSE 187*  BUN 22  CREATININE 1.13  CALCIUM 10.2    GFR: Estimated Creatinine Clearance: 49.5 mL/min (by C-G formula based on SCr of 1.13 mg/dL).  Liver Function Tests: Recent Labs  Lab 01/09/22 0851  AST 16  ALT 16  ALKPHOS 60  BILITOT 1.0  PROT 6.9  ALBUMIN 4.4    Urine analysis:    Component Value Date/Time   COLORURINE YELLOW 01/09/2022 1003   APPEARANCEUR CLEAR 01/09/2022 1003   LABSPEC 1.014 01/09/2022 1003   PHURINE 5.5 01/09/2022 1003   GLUCOSEU NEGATIVE 01/09/2022 1003   HGBUR NEGATIVE 01/09/2022 1003   BILIRUBINUR NEGATIVE 01/09/2022 1003   KETONESUR NEGATIVE 01/09/2022 1003   PROTEINUR NEGATIVE 01/09/2022 1003   UROBILINOGEN 1.0 10/17/2011 1455   NITRITE  NEGATIVE 01/09/2022 1003   LEUKOCYTESUR NEGATIVE 01/09/2022 1003    Radiological Exams on Admission: CT ANGIO GI BLEED  Result Date: 01/09/2022 CLINICAL DATA:  Rectal bleeding. EXAM: CTA ABDOMEN AND PELVIS WITHOUT AND WITH CONTRAST TECHNIQUE: Multidetector CT imaging of the abdomen and pelvis was performed using the standard protocol during bolus administration of intravenous contrast. Multiplanar reconstructed images and MIPs were obtained and reviewed to evaluate the vascular anatomy. RADIATION DOSE  REDUCTION: This exam was performed according to the departmental dose-optimization program which includes automated exposure control, adjustment of the mA and/or kV according to patient size and/or use of iterative reconstruction technique. CONTRAST:  68m OMNIPAQUE IOHEXOL 350 MG/ML SOLN COMPARISON:  None Available. FINDINGS: VASCULAR Aorta: Normal caliber aorta without aneurysm, dissection, vasculitis or significant stenosis. Mild atherosclerotic calcification evident. Celiac: Patent without evidence of aneurysm, dissection, vasculitis or significant stenosis. SMA: Patent without evidence of aneurysm, dissection, vasculitis or significant stenosis. Renals: Both renal arteries are patent without evidence of aneurysm, dissection, vasculitis, fibromuscular dysplasia or significant stenosis. Accessory right renal artery evident. IMA: Patent without evidence of aneurysm, dissection, vasculitis or significant stenosis. Inflow: Patent without evidence of aneurysm, dissection, vasculitis or significant stenosis. Proximal Outflow: Bilateral common femoral and visualized portions of the superficial and profunda femoral arteries are patent without evidence of aneurysm, dissection, vasculitis or significant stenosis. Veins: No obvious venous abnormality within the limitations of this arterial phase study. Review of the MIP images confirms the above findings. NON-VASCULAR Lower chest: Bronchial wall thickening with mild  diffuse bronchiectasis noted in the lung bases bilaterally. 8 mm right lower lobe pulmonary nodule on image 1/series 15 is been incompletely visualized. Hepatobiliary: No suspicious focal abnormality within the liver parenchyma. There is no evidence for gallstones, gallbladder wall thickening, or pericholecystic fluid. No intrahepatic or extrahepatic biliary dilation. Pancreas: No focal mass lesion. No dilatation of the main duct. No intraparenchymal cyst. No peripancreatic edema. Spleen: No splenomegaly. No focal mass lesion. Adrenals/Urinary Tract: No adrenal nodule or mass. 9.4 cm exophytic simple cyst noted lower pole left kidney 2.7 cm exophytic low-density lesion lower pole right kidney (38/4) has somewhat prominent ill-defined wall without definite enhancing mural nodule. Other scattered hypoattenuating lesions in both kidneys are too small to characterize. 8 mm hyperattenuating lesion upper pole right kidney on 28/4 may be a tiny cyst complicated by proteinaceous debris or hemorrhage. No evidence for hydroureter. The urinary bladder appears normal for the degree of distention. Stomach/Bowel: Stomach is unremarkable. No gastric wall thickening. No evidence of outlet obstruction. Duodenum is normally positioned as is the ligament of Treitz. No small bowel wall thickening. No small bowel dilatation. The terminal ileum is normal. The appendix is normal. No gross colonic mass. No colonic wall thickening. No evidence for arterial phase contrast extravasation into the stomach or small bowel to suggest active arterial phase bleeding. There is a focus of active intraluminal extravasation in the splenic flexure of the colon (axial image 50/series 5 with subsequent accumulation of contrast in this region of colon on portal venous phase imaging (images 17-22 of series 11). No colonic wall mass lesion evident at this location features may be related to bleeding from an adjacent diverticulum or underlying vascular  malformation/angiodysplasia. Lymphatic: There is no gastrohepatic or hepatoduodenal ligament lymphadenopathy. No retroperitoneal or mesenteric lymphadenopathy. No pelvic sidewall lymphadenopathy. Reproductive: The prostate gland and seminal vesicles are unremarkable. Other: No intraperitoneal free fluid. Musculoskeletal: Status post left total hip replacement. Bones are diffusely demineralized. IMPRESSION: 1. Small focus of active intraluminal arterial phase extravasation in the splenic flexure of the colon with subsequent accumulation of contrast in this region of colon on portal venous phase imaging. No colonic wall mass lesion evident at this location. Imaging features may be related to bleeding from an adjacent diverticulum or underlying vascular malformation/angiodysplasia. 2. 8 mm right lower lobe pulmonary nodule, incompletely visualized. Dedicated CT chest without contrast recommended to further evaluate. 3. 2.7 cm exophytic low-density lesion lower pole right  kidney has somewhat prominent ill-defined wall without definite enhancing mural nodule. Additional lesions in both kidneys evident, too small to characterize. MRI abdomen with and without contrast could be used to further evaluate. 4. 8 mm hyperattenuating lesion upper pole right kidney may be a tiny cyst complicated by proteinaceous debris or hemorrhage. This could also be assessed at the follow-up MRI. 5. Bronchial wall thickening with mild diffuse bronchiectasis in the lung bases bilaterally. I have personally made multiple attempts to contact the emergency room without answer. These results will be called to the ordering clinician or representative by the Radiologist Assistant, and communication documented in the PACS or [employer]. Electronically Signed   By: Misty Stanley M.D.   On: 01/09/2022 10:47    EKG: Not performed in the emergency department  Assessment/Plan Principal Problem:   GIB (gastrointestinal bleeding) Active  Problems:   Pure hypercholesterolemia   Obstructive sleep apnea syndrome   Hypertension associated with diabetes ([place])   Controlled type 2 diabetes mellitus without complication, without long-term current use of insulin (H[facility])   Benign prostatic hyperplasia with lower urinary tract symptoms   Abnormal CT of the abdomen    GI bleed > Presenting with over 4 episodes of dark stools with bright red blood covering them starting just this morning. > Gross blood on ED exam.  Initial hemoglobin stable at 15 however repeat had dropped 2 g to 13.1 over the course of the day. > GI has been consulted and are following with plan for endoscopy. - Monitor on telemetry unit - Appreciate GI recommendations - Continue to trend hemoglobin - Transfuse for hemoglobin less than 7 or significant symptoms  CT abnormalities > Patient noted to have lung nodule on CT scan incidentally with recommendation for dedicated CT chest follow-up > Patient also noted to have renal lesion with recommended for dedicated MRI follow-up.  Note does have family history of kidney lesions/cancer.  Hyperlipidemia - Continue home atorvastatin  OSA - No longer using CPAP   Hypertension - Plan to resume home amlodipine and spironolactone tomorrow  provided he does not drop his blood pressure  Diabetes - SSI  BPH - Continue home finasteride - Replace home silodosin with formulary Flomax  DVT prophylaxis: SCDs Code Status:   Full Family Communication:  Son updated on speaker phone while with the patient. Disposition Plan:   Patient is from:  Home  Anticipated DC to:  Home  Anticipated DC date:  1 to 3 days  Anticipated DC barriers: None  Consults called:  GI Admission status:  Observation, telemetry  Severity of Illness: The appropriate patient status for this patient is OBSERVATION. Observation status is judged to be reasonable and necessary in order to provide the required intensity of service to ensure the  patient's safety. The patient's presenting symptoms, physical exam findings, and initial radiographic and laboratory data in the context of their medical condition is felt to place them at decreased risk for further clinical deterioration. Furthermore, it is anticipated that the patient will be medically stable for discharge from the hospital within 2 midnights of admission.    Marcelyn Bruins MD [facility]  How to contact the [facility] Attending or Consulting provider [place] or covering provider during after hours [place], for this patient?   Check the care team in [facility] and look for a) attending/consulting [facility] provider listed and b) the [facility] team listed Log into [email or web address] and use [facility] universal password to access. If you do not have  the password, please contact the hospital operator. Locate the [facility] provider you are looking for under [facility] and page to a number that you can be directly reached. If you still have difficulty reaching the provider, please page the [facility] (Director on Call) for the Hospitalists listed on amion for assistance.  01/09/2022, 5:37 PM

## 2022-01-09 NOTE — ED Notes (Signed)
Pt ambulatory to restroom

## 2022-01-10 DIAGNOSIS — K5731 Diverticulosis of large intestine without perforation or abscess with bleeding: Secondary | ICD-10-CM | POA: Diagnosis not present

## 2022-01-10 DIAGNOSIS — N401 Enlarged prostate with lower urinary tract symptoms: Secondary | ICD-10-CM | POA: Diagnosis not present

## 2022-01-10 DIAGNOSIS — K921 Melena: Secondary | ICD-10-CM

## 2022-01-10 DIAGNOSIS — K5791 Diverticulosis of intestine, part unspecified, without perforation or abscess with bleeding: Secondary | ICD-10-CM | POA: Diagnosis not present

## 2022-01-10 DIAGNOSIS — I152 Hypertension secondary to endocrine disorders: Secondary | ICD-10-CM | POA: Diagnosis not present

## 2022-01-10 DIAGNOSIS — E1159 Type 2 diabetes mellitus with other circulatory complications: Secondary | ICD-10-CM | POA: Diagnosis not present

## 2022-01-10 DIAGNOSIS — R935 Abnormal findings on diagnostic imaging of other abdominal regions, including retroperitoneum: Secondary | ICD-10-CM | POA: Diagnosis not present

## 2022-01-10 LAB — GLUCOSE, CAPILLARY
Glucose-Capillary: 119 mg/dL — ABNORMAL HIGH (ref 70–99)
Glucose-Capillary: 110 mg/dL — ABNORMAL HIGH (ref 70–99)
Glucose-Capillary: 96 mg/dL (ref 70–99)
Glucose-Capillary: 109 mg/dL — ABNORMAL HIGH (ref 70–99)

## 2022-01-10 LAB — HEMOGLOBIN AND HEMATOCRIT, BLOOD
HCT: 38.3 % — ABNORMAL LOW (ref 39.0–52.0)
HCT: 38.9 % — ABNORMAL LOW (ref 39.0–52.0)
HCT: 34.9 % — ABNORMAL LOW (ref 39.0–52.0)
HCT: 33.4 % — ABNORMAL LOW (ref 39.0–52.0)
Hemoglobin: 12.5 g/dL — ABNORMAL LOW (ref 13.0–17.0)
Hemoglobin: 12.6 g/dL — ABNORMAL LOW (ref 13.0–17.0)
Hemoglobin: 11.9 g/dL — ABNORMAL LOW (ref 13.0–17.0)
Hemoglobin: 11.2 g/dL — ABNORMAL LOW (ref 13.0–17.0)

## 2022-01-10 LAB — BASIC METABOLIC PANEL
Anion gap: 8 (ref 5–15)
BUN: 20 mg/dL (ref 8–23)
CO2: 24 mmol/L (ref 22–32)
Calcium: 9.7 mg/dL (ref 8.9–10.3)
Chloride: 104 mmol/L (ref 98–111)
Creatinine, Ser: 1.19 mg/dL (ref 0.61–1.24)
GFR, Estimated: >60 mL/min (ref >60)
Glucose, Bld: 108 mg/dL — ABNORMAL HIGH (ref 70–99)
Potassium: 4.1 mmol/L (ref 3.5–5.1)
Sodium: 136 mmol/L (ref 135–145)

## 2022-01-10 MED ORDER — GUAIFENESIN-DM 100-10 MG/5ML PO SYRP
5.0000 mL | ORAL_SOLUTION | ORAL | Status: DC | PRN
  Administered 2022-01-10 – 2022-01-11 (×2): 5 mL via ORAL
  Filled 2022-01-10 (×2): qty 5

## 2022-01-10 MED ORDER — IPRATROPIUM-ALBUTEROL 0.5-2.5 (3) MG/3ML IN SOLN: 3.0000 mL | Freq: Four times a day (QID) | RESPIRATORY_TRACT | Status: DC | PRN

## 2022-01-10 MED ORDER — DOXYCYCLINE HYCLATE 100 MG PO TABS
100.0000 mg | ORAL_TABLET | Freq: Two times a day (BID) | ORAL | Status: DC
  Administered 2022-01-10 – 2022-01-11 (×2): 100 mg via ORAL
  Filled 2022-01-10 (×2): qty 1

## 2022-01-10 NOTE — Consult Note (Addendum)
Referring Provider: EDP Primary Care Physician:  Allwardt, Randa Evens, PA-C Primary Gastroenterologist:  Dr. Deatra Ina previously  Reason for Consultation:  GI bleed  HPI: Danny Hunter is a 81 y.o. male with medical history significant of hyperlipidemia, OSA, hypertension, diabetes, BPH who presented to [place] with complaints of rectal bleeding.   Patient started having several episodes of dark stools with bright red blood covering them (as frequently as every 15-30 min at one point) at home yesterday.  He has never had this before he denies any anticoagulation.  He does take a daily baby aspirin just since recent hip surgery about 8 weeks ago.  Does not use NSAIDs significantly.  No abdominal pain.  BUN  normal.  Hgb down to 12.6 grams today from about a 15 gram baseline.     CT angio showed the following:   IMPRESSION: 1. Small focus of active intraluminal arterial phase extravasation in the splenic flexure of the colon with subsequent accumulation of contrast in this region of colon on portal venous phase imaging. No colonic wall mass lesion evident at this location. Imaging features may be related to bleeding from an adjacent diverticulum or underlying vascular malformation/angiodysplasia. 2. 8 mm right lower lobe pulmonary nodule, incompletely visualized. Dedicated CT chest without contrast recommended to further evaluate. 3. 2.7 cm exophytic low-density lesion lower pole right kidney has somewhat prominent ill-defined wall without definite enhancing mural nodule. Additional lesions in both kidneys evident, too small to characterize. MRI abdomen with and without contrast could be used to further evaluate. 4. 8 mm hyperattenuating lesion upper pole right kidney may be a tiny cyst complicated by proteinaceous debris or hemorrhage. This could also be assessed at the follow-up MRI. 5. Bronchial wall thickening with mild diffuse bronchiectasis in the lung bases  bilaterally.  Was transferred to [facility] hospital.  By the time he got here the bleeding had slowed and he was not taken to IR for intervention.  It has now been about 12 hours since any further passing of blood (around 10 o'clock last PM).  Otherwise he feels well.  Two sons are at bedside.  Colonoscopy 10/2014:  ENDOSCOPIC IMPRESSION: 1. Sessile polyp was found in the descending colon; polypectomy was performed with a cold snare.  Tubular adenoma. 2. There was mild diverticulosis noted in the ascending colon, transverse colon, and descending colon 3. 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 Authorizing Provider  amLODipine (NORVASC)  5 MG tablet Take 1 tablet (5 mg total) by mouth daily. 07/31/21  Yes Allwardt, Alyssa M, PA-C  atorvastatin (LIPITOR) 40 MG tablet Take 1 tablet (40 mg total) by mouth daily. 06/14/21  Yes Allwardt, Alyssa M, PA-C  finasteride (PROSCAR) 5 MG tablet Take 1 tablet (5 mg total) by mouth daily. 05/10/21  Yes Allwardt, Alyssa M, PA-C  metFORMIN (GLUCOPHAGE) 500 MG tablet Take 2  tablets (1,000 mg total) by mouth 2 (two) times daily with a meal. 10/15/21  Yes Allwardt, Alyssa M, PA-C  silodosin (RAPAFLO) 8 MG CAPS capsule Take 1 capsule (8 mg total) by mouth daily. 10/15/21  Yes Allwardt, Alyssa M, PA-C  spironolactone (ALDACTONE) 25 MG tablet Take 1 tablet by mouth once daily 01/01/22  Yes Allwardt, Alyssa M, PA-C    Current Facility-Administered Medications  Medication Dose Route Frequency Provider Last Rate Last Admin   acetaminophen (TYLENOL) tablet 650 mg  650 mg Oral Q6H PRN Marcelyn Bruins, MD       Or   acetaminophen (TYLENOL) suppository 650 mg  650 mg Rectal Q6H PRN Marcelyn Bruins, MD       amLODipine (NORVASC) tablet 5 mg  5 mg Oral Daily Marcelyn Bruins, MD       atorvastatin (LIPITOR) tablet 40 mg  40 mg Oral Daily Marcelyn Bruins, MD       finasteride (PROSCAR) tablet 5 mg  5 mg Oral Daily Marcelyn Bruins, MD       insulin aspart (novoLOG) injection 0-15 Units  0-15 Units Subcutaneous TID WC Marcelyn Bruins, MD       sodium chloride flush (NS) 0.9 % injection 3 mL  3 mL Intravenous Q12H Marcelyn Bruins, MD   3 mL at 01/09/22 2150   spironolactone (ALDACTONE) tablet 25 mg  25 mg Oral Daily Marcelyn Bruins, MD       tamsulosin (FLOMAX) capsule 0.4 mg  0.4 mg Oral Daily Marcelyn Bruins, MD        Allergies as of 01/09/2022 - Review Complete 01/09/2022  Allergen Reaction Noted   Penicillin g Other (See Comments) 08/19/2019    Family History  Problem Relation Age of Onset   Cancer Mother    Heart disease Father    Cancer Son     Social History   Socioeconomic History   Marital status: Married    Spouse name: Not on file   Number of children: Not on file   Years of education: Not on file   Highest education level: Not on file  Occupational History   Occupation: Retired  Tobacco Use   Smoking status: Never   Smokeless tobacco: Never  Vaping Use   Vaping Use: Never used  Substance and Sexual Activity    Alcohol use: Yes    Comment: 1 drink a month   Drug use: No   Sexual activity: Not Currently  Other Topics Concern   Not on file  Social History Narrative   Left handed   Social Determinants of Health   Financial Resource Strain: Low Risk  (10/29/2021)   Overall Financial Resource Strain (CARDIA)    Difficulty of Paying Living Expenses: Not hard at all  Food Insecurity: No Food Insecurity (10/29/2021)   Hunger Vital Sign    Worried About Running Out of Food in the Last Year: Never true    Ran Out of Food in the Last Year: Never true  Transportation Needs: No Transportation Needs (10/29/2021)  PRAPARE - Hydrologist (Medical): No    Lack of Transportation (Non-Medical): No  Physical Activity: Sufficiently Active (10/29/2021)   Exercise Vital Sign    Days of Exercise per Week: 4 days    Minutes of Exercise per Session: 60 min  Stress: No Stress Concern Present (10/29/2021)   [place]    Feeling of Stress : Not at all  Social Connections: Moderately Integrated (10/29/2021)   Social Connection and Isolation Panel [NHANES]    Frequency of Communication with Friends and Family: More than three times a week    Frequency of Social Gatherings with Friends and Family: More than three times a week    Attends Religious Services: Never    Marine scientist or Organizations: Yes    Attends Archivist Meetings: 1 to 4 times per year    Marital Status: Married  Human resources officer Violence: Not At Risk (10/29/2021)   Humiliation, Afraid, Rape, and Kick questionnaire    Fear of Current or Ex-Partner: No    Emotionally Abused: No    Physically Abused: No    Sexually Abused: No    Review of Systems: ROS is O/W negative except as mentioned in HPI.  Physical Exam: Vital signs in last 24 hours: Temp:  [97.6 F (36.4 C)-98.4 F (36.9 C)] 98 F (36.7 C) (12/07 0730) Pulse Rate:  [77-93]  78 (12/07 0730) Resp:  [10-19] 16 (12/07 0730) BP: (106-135)/(55-85) 122/76 (12/07 0730) SpO2:  [93 %-100 %] 96 % (12/07 0730) Last BM Date : 01/09/22 General:  Alert, Well-developed, well-nourished, pleasant and cooperative in NAD Head:  Normocephalic and atraumatic. Eyes:  Sclera clear, no icterus.  Conjunctiva pink. Ears:  Normal auditory acuity. Mouth:  No deformity or lesions.   Lungs:  Clear throughout to auscultation.  No wheezes, crackles, or rhonchi.  Heart:  Regular rate and rhythm; no murmurs, clicks, rubs, or gallops. Abdomen:  Soft, non-distended.  BS present.  Non-tender.   Msk:  Symmetrical without gross deformities. Pulses:  Normal pulses noted. Extremities:  Without clubbing or edema. Neurologic:  Alert and oriented x 4;  grossly normal neurologically. Skin:  Intact without significant lesions or rashes. Psych:  Alert and cooperative. Normal mood and affect.  Lab Results: Recent Labs    01/09/22 0851 01/09/22 1418 01/09/22 1955 01/10/22 0311 01/10/22 0811  WBC 9.6  --   --   --   --   HGB 15.0   < > 12.8* 12.5* 12.6*  HCT 46.2   < > 37.8* 38.3* 38.9*  PLT 289  --   --   --   --    < > = values in this interval not displayed.   BMET Recent Labs    01/09/22 0851 01/10/22 0311  NA 138 136  K 4.5 4.1  CL 103 104  CO2 24 24  GLUCOSE 187* 108*  BUN 22 20  CREATININE 1.13 1.19  CALCIUM 10.2 9.7   LFT Recent Labs    01/09/22 0851  PROT 6.9  ALBUMIN 4.4  AST 16  ALT 16  ALKPHOS 60  BILITOT 1.0   Studies/Results: CT ANGIO GI BLEED  Result Date: 01/09/2022 CLINICAL DATA:  Rectal bleeding. EXAM: CTA ABDOMEN AND PELVIS WITHOUT AND WITH CONTRAST TECHNIQUE: Multidetector CT imaging of the abdomen and pelvis was performed using the standard protocol during bolus administration of intravenous contrast. Multiplanar reconstructed images and MIPs were obtained and  reviewed to evaluate the vascular anatomy. RADIATION DOSE REDUCTION: This exam was performed  according to the departmental dose-optimization program which includes automated exposure control, adjustment of the mA and/or kV according to patient size and/or use of iterative reconstruction technique. CONTRAST:  84m OMNIPAQUE IOHEXOL 350 MG/ML SOLN COMPARISON:  None Available. FINDINGS: VASCULAR Aorta: Normal caliber aorta without aneurysm, dissection, vasculitis or significant stenosis. Mild atherosclerotic calcification evident. Celiac: Patent without evidence of aneurysm, dissection, vasculitis or significant stenosis. SMA: Patent without evidence of aneurysm, dissection, vasculitis or significant stenosis. Renals: Both renal arteries are patent without evidence of aneurysm, dissection, vasculitis, fibromuscular dysplasia or significant stenosis. Accessory right renal artery evident. IMA: Patent without evidence of aneurysm, dissection, vasculitis or significant stenosis. Inflow: Patent without evidence of aneurysm, dissection, vasculitis or significant stenosis. Proximal Outflow: Bilateral common femoral and visualized portions of the superficial and profunda femoral arteries are patent without evidence of aneurysm, dissection, vasculitis or significant stenosis. Veins: No obvious venous abnormality within the limitations of this arterial phase study. Review of the MIP images confirms the above findings. NON-VASCULAR Lower chest: Bronchial wall thickening with mild diffuse bronchiectasis noted in the lung bases bilaterally. 8 mm right lower lobe pulmonary nodule on image 1/series 15 is been incompletely visualized. Hepatobiliary: No suspicious focal abnormality within the liver parenchyma. There is no evidence for gallstones, gallbladder wall thickening, or pericholecystic fluid. No intrahepatic or extrahepatic biliary dilation. Pancreas: No focal mass lesion. No dilatation of the main duct. No intraparenchymal cyst. No peripancreatic edema. Spleen: No splenomegaly. No focal mass lesion. Adrenals/Urinary  Tract: No adrenal nodule or mass. 9.4 cm exophytic simple cyst noted lower pole left kidney 2.7 cm exophytic low-density lesion lower pole right kidney (38/4) has somewhat prominent ill-defined wall without definite enhancing mural nodule. Other scattered hypoattenuating lesions in both kidneys are too small to characterize. 8 mm hyperattenuating lesion upper pole right kidney on 28/4 may be a tiny cyst complicated by proteinaceous debris or hemorrhage. No evidence for hydroureter. The urinary bladder appears normal for the degree of distention. Stomach/Bowel: Stomach is unremarkable. No gastric wall thickening. No evidence of outlet obstruction. Duodenum is normally positioned as is the ligament of Treitz. No small bowel wall thickening. No small bowel dilatation. The terminal ileum is normal. The appendix is normal. No gross colonic mass. No colonic wall thickening. No evidence for arterial phase contrast extravasation into the stomach or small bowel to suggest active arterial phase bleeding. There is a focus of active intraluminal extravasation in the splenic flexure of the colon (axial image 50/series 5 with subsequent accumulation of contrast in this region of colon on portal venous phase imaging (images 17-22 of series 11). No colonic wall mass lesion evident at this location features may be related to bleeding from an adjacent diverticulum or underlying vascular malformation/angiodysplasia. Lymphatic: There is no gastrohepatic or hepatoduodenal ligament lymphadenopathy. No retroperitoneal or mesenteric lymphadenopathy. No pelvic sidewall lymphadenopathy. Reproductive: The prostate gland and seminal vesicles are unremarkable. Other: No intraperitoneal free fluid. Musculoskeletal: Status post left total hip replacement. Bones are diffusely demineralized. IMPRESSION: 1. Small focus of active intraluminal arterial phase extravasation in the splenic flexure of the colon with subsequent accumulation of contrast in  this region of colon on portal venous phase imaging. No colonic wall mass lesion evident at this location. Imaging features may be related to bleeding from an adjacent diverticulum or underlying vascular malformation/angiodysplasia. 2. 8 mm right lower lobe pulmonary nodule, incompletely visualized. Dedicated CT chest without contrast recommended to further evaluate.  3. 2.7 cm exophytic low-density lesion lower pole right kidney has somewhat prominent ill-defined wall without definite enhancing mural nodule. Additional lesions in both kidneys evident, too small to characterize. MRI abdomen with and without contrast could be used to further evaluate. 4. 8 mm hyperattenuating lesion upper pole right kidney may be a tiny cyst complicated by proteinaceous debris or hemorrhage. This could also be assessed at the follow-up MRI. 5. Bronchial wall thickening with mild diffuse bronchiectasis in the lung bases bilaterally. I have personally made multiple attempts to contact the emergency room without answer. These results will be called to the ordering clinician or representative by the Radiologist Assistant, and communication documented in the PACS or [employer]. Electronically Signed   By: Misty Stanley M.D.   On: 01/09/2022 10:47    IMPRESSION:  *81 year old male with GI bleed:  CT angio positive for bleeding at the splenic flexure.  Suspect diverticular bleed.  Hgb has remained stable at 12.6 grams, but that was an initial drop from around 15 grams.  No further bleeding in 12 hours.  Due to slowed bleeding he was not taken to IR.  PLAN: -I had long conversation with the patient and 2 of his sons at bedside today.  Options at this point now that the bleeding seems to be resolving would be to continue to observe as most of these stop bleeding on their own without need for intervention or to prep him for colonoscopy for tomorrow.  Certainly if he were to rebleed significantly would need to go to IR.  They opted  to continue for observation and so we decided to place him on a full liquid diet advance to soft diet this evening if all goes well throughout the day. -Monitor/trend labs.   Laban Emperor. Zehr  01/10/2022, 9:29 AM    Attending physician's note  I have taken a history, reviewed the chart and examined the patient. I performed a substantive portion of this encounter, including complete performance of at least one of the key components, in conjunction with the APP. I agree with the APP's note, impression and recommendations.    81 year old male admitted acute GI bleed concerning for diverticular hemorrhage  CTA positive for active extravasation in the sigmoid region from possible diverticula  Last episode of hematochezia was last night at 10 PM, no further bleeding  Will continue to monitor  No intervention needed if he has no further bleeding  Slowly advance diet as tolerated   The patient was provided an opportunity to ask questions and all were answered. The patient agreed with the plan and demonstrated an understanding of the instructions.  Damaris Hippo , MD [phone]

## 2022-01-10 NOTE — Progress Notes (Signed)
[facility]  PROGRESS NOTE  Danny Hunter LOV:[id number] DOB: [birth year] DOA: 01/09/2022 PCP: Fredirick Lathe, PA-C   Brief HPI:   81 year old male with medical history of hyperlipidemia, OSA, hypertension, diabetes mellitus type 2, BPH presented with rectal bleeding.  Patient stated that he was having very dark stools and bright red blood covering them.  He was started on aspirin twice a day since he had left total hip arthroplasty in August 2023.  As per discharge summary from orthopedics he was supposed to be on aspirin twice a day only for 28 days for DVT prophylaxis. In the ED hemoglobin was 13.1.  CTA obtained showedGI bleed study showed small foci of active contrast extravasation at the splenic flexure.  Also noted with pulmonary nodule, renal nodules with MRI recommended, bronchial wall thickening and bronchiectasis.  GI consulted    Subjective   Patient seen and examined, no bleeding since last night at 10 PM.  Appreciate GI consult.    Assessment/Plan:     Rectal bleeding -Appreciate GI evaluation, previous colonoscopy in 2016 had showed mild diverticulosis in the ascending transverse and descending colon -Likely has diverticular bleed, usually self-limited -No more bleeding since last night, hemoglobin has remained stable -No plan for endoscopic procedure at this time  Lung nodule -8 mm lung nodule seen on the CTA abdomen -Needs outpatient follow-up with PCP for further evaluation -Likely will need CT chest  Right kidney lesion -Seen on CT abdomen -8 mm lesion upper pole right kidney; may be a complicated cyst with proteinaceous debris's or hemorrhage  need MRI of abdomen as outpatient  Hyperlipidemia -Continue atorvastatin  OSA -No longer using CPAP at home  Hypertension -Blood pressure is stable -Amlodipine and Aldactone have been restarted  BPH -Continue finasteride, tamsulosin  Diabetes mellitus type 2 -Continue sliding scale insulin with  NovoLog -CBG well-controlled   Medications     amLODipine  5 mg Oral Daily   atorvastatin  40 mg Oral Daily   finasteride  5 mg Oral Daily   insulin aspart  0-15 Units Subcutaneous TID WC   sodium chloride flush  3 mL Intravenous Q12H   spironolactone  25 mg Oral Daily   tamsulosin  0.4 mg Oral Daily     Data Reviewed:   CBG:  Recent Labs  Lab 01/09/22 2149 01/10/22 0728 01/10/22 1118  GLUCAP 118* 119* 110*    SpO2: 96 %    Vitals:   01/09/22 2000 01/09/22 2350 01/10/22 0528 01/10/22 0730  BP: 124/77 132/75 118/74 122/76  Pulse: 77 87 79 78  Resp: 17   16  Temp: 98.4 F (36.9 C) 98.1 F (36.7 C) 97.6 F (36.4 C) 98 F (36.7 C)  TempSrc: Oral Oral Oral Oral  SpO2: 96% 97% 97% 96%      Data Reviewed:  Basic Metabolic Panel: Recent Labs  Lab 01/09/22 0851 01/10/22 0311  NA 138 136  K 4.5 4.1  CL 103 104  CO2 24 24  GLUCOSE 187* 108*  BUN 22 20  CREATININE 1.13 1.19  CALCIUM 10.2 9.7    CBC: Recent Labs  Lab 01/09/22 0851 01/09/22 1418 01/09/22 1955 01/10/22 0311 01/10/22 0811 01/10/22 1146  WBC 9.6  --   --   --   --   --   HGB 15.0 13.1 12.8* 12.5* 12.6* 11.9*  HCT 46.2 39.4 37.8* 38.3* 38.9* 34.9*  MCV 93.3  --   --   --   --   --  PLT 289  --   --   --   --   --     LFT Recent Labs  Lab 01/09/22 0851  AST 16  ALT 16  ALKPHOS 60  BILITOT 1.0  PROT 6.9  ALBUMIN 4.4     Antibiotics: Anti-infectives (From admission, onward)    None        DVT prophylaxis: SCDs  Code Status: Full code  Family Communication: No family at bedside   CONSULTS gastroenterology   Objective    Physical Examination:   General: Appears in no acute distress Cardiovascular: S1-S2, regular, no murmur auscultated Respiratory: Clear to auscultation bilaterally Abdomen: Soft, nontender, no organomegaly Extremities: No edema in the lower extremities Neurologic: No focal deficit noted at this time   Status is: Inpatient: Rectal  bleeding           Naomi   [facility] If 7PM-7AM, please contact night-coverage at [email or web address], Office  [phone]   01/10/2022, 3:55 PM  LOS: 0 days

## 2022-01-11 ENCOUNTER — Other Ambulatory Visit: Payer: Self-pay

## 2022-01-11 DIAGNOSIS — E1159 Type 2 diabetes mellitus with other circulatory complications: Secondary | ICD-10-CM | POA: Diagnosis not present

## 2022-01-11 DIAGNOSIS — K5731 Diverticulosis of large intestine without perforation or abscess with bleeding: Secondary | ICD-10-CM | POA: Diagnosis not present

## 2022-01-11 DIAGNOSIS — R935 Abnormal findings on diagnostic imaging of other abdominal regions, including retroperitoneum: Secondary | ICD-10-CM | POA: Diagnosis not present

## 2022-01-11 DIAGNOSIS — I152 Hypertension secondary to endocrine disorders: Secondary | ICD-10-CM | POA: Diagnosis not present

## 2022-01-11 DIAGNOSIS — K5791 Diverticulosis of intestine, part unspecified, without perforation or abscess with bleeding: Secondary | ICD-10-CM | POA: Diagnosis not present

## 2022-01-11 DIAGNOSIS — N401 Enlarged prostate with lower urinary tract symptoms: Secondary | ICD-10-CM | POA: Diagnosis not present

## 2022-01-11 DIAGNOSIS — K921 Melena: Secondary | ICD-10-CM | POA: Diagnosis not present

## 2022-01-11 LAB — CBC
HCT: 36.1 % — ABNORMAL LOW (ref 39.0–52.0)
Hemoglobin: 11.8 g/dL — ABNORMAL LOW (ref 13.0–17.0)
MCH: 30.5 pg (ref 26.0–34.0)
MCHC: 32.7 g/dL (ref 30.0–36.0)
MCV: 93.3 fL (ref 80.0–100.0)
Platelets: 234 10*3/uL (ref 150–400)
RBC: 3.87 MIL/uL — ABNORMAL LOW (ref 4.22–5.81)
RDW: 13.5 % (ref 11.5–15.5)
WBC: 8.8 10*3/uL (ref 4.0–10.5)
nRBC: 0 % (ref 0.0–0.2)

## 2022-01-11 LAB — GLUCOSE, CAPILLARY
Glucose-Capillary: 128 mg/dL — ABNORMAL HIGH (ref 70–99)
Glucose-Capillary: 124 mg/dL — ABNORMAL HIGH (ref 70–99)

## 2022-01-11 LAB — BASIC METABOLIC PANEL
Anion gap: 9 (ref 5–15)
BUN: 25 mg/dL — ABNORMAL HIGH (ref 8–23)
CO2: 23 mmol/L (ref 22–32)
Calcium: 9.7 mg/dL (ref 8.9–10.3)
Chloride: 104 mmol/L (ref 98–111)
Creatinine, Ser: 1.05 mg/dL (ref 0.61–1.24)
GFR, Estimated: >60 mL/min (ref >60)
Glucose, Bld: 105 mg/dL — ABNORMAL HIGH (ref 70–99)
Potassium: 4.1 mmol/L (ref 3.5–5.1)
Sodium: 136 mmol/L (ref 135–145)

## 2022-01-11 LAB — HEMOGLOBIN AND HEMATOCRIT, BLOOD
HCT: 33.9 % — ABNORMAL LOW (ref 39.0–52.0)
Hemoglobin: 11.5 g/dL — ABNORMAL LOW (ref 13.0–17.0)

## 2022-01-11 MED ORDER — ALBUTEROL SULFATE HFA 108 (90 BASE) MCG/ACT IN AERS: 2.0000 | INHALATION_SPRAY | Freq: Four times a day (QID) | RESPIRATORY_TRACT | 2 refills | Status: DC | PRN

## 2022-01-11 MED ORDER — GUAIFENESIN-DM 100-10 MG/5ML PO SYRP: 5.0000 mL | ORAL_SOLUTION | ORAL | 0 refills | Status: DC | PRN

## 2022-01-11 MED ORDER — DOXYCYCLINE HYCLATE 100 MG PO TABS: 100.0000 mg | ORAL_TABLET | Freq: Two times a day (BID) | ORAL | 0 refills | Status: DC

## 2022-01-11 NOTE — Progress Notes (Signed)
[facility] ROUNDING NOTE   Subjective: No bowel movement in the past 24 hours, no further bleeding   Objective: Vital signs in last 24 hours: Temp:  [97.7 F (36.5 C)-98.5 F (36.9 C)] 98.5 F (36.9 C) (12/08 1131) Pulse Rate:  [73-89] 89 (12/08 1131) Resp:  [16-18] 18 (12/08 1131) BP: (109-133)/(66-73) 111/66 (12/08 1131) SpO2:  [95 %-99 %] 99 % (12/08 1131) Weight:  [77.1 kg] 77.1 kg (12/08 1203) Last BM Date : 01/10/22 General: NAD    Intake/Output from previous day: No intake/output data recorded. Intake/Output this shift: No intake/output data recorded.   Lab Results: Recent Labs    01/09/22 0851 01/09/22 1418 01/10/22 2016 01/11/22 0309 01/11/22 0838  WBC 9.6  --   --  8.8  --   HGB 15.0   < > 11.2* 11.8* 11.5*  PLT 289  --   --  234  --   MCV 93.3  --   --  93.3  --    < > = values in this interval not displayed.   BMET Recent Labs    01/09/22 0851 01/10/22 0311 01/11/22 0309  NA 138 136 136  K 4.5 4.1 4.1  CL 103 104 104  CO2 '24 24 23  '$ GLUCOSE 187* 108* 105*  BUN 22 20 25*  CREATININE 1.13 1.19 1.05  CALCIUM 10.2 9.7 9.7   LFT Recent Labs    01/09/22 0851  PROT 6.9  ALBUMIN 4.4  AST 16  ALT 16  ALKPHOS 60  BILITOT 1.0   PT/INR No results for input(s): "INR" in the last 72 hours.    Imaging/Other results: No results found.    Assessment &Plan  81 year old male admitted acute GI bleed concerning for diverticular hemorrhage   CTA positive for active extravasation in the sigmoid region from possible diverticula   Last episode of hematochezia was Wednesday night at 10 PM, no further bleeding > 24 hours  He is tolerating diet Hemoglobin remains stable with no hemodynamic instability  Okay to discharge patient home from [place] , MD [phone]  [facility]

## 2022-01-11 NOTE — Discharge Summary (Signed)
Physician Discharge Summary   Patient: Danny Hunter MRN: [id number] DOB: 1940/12/29  Admit date:     01/09/2022  Discharge date: 01/11/22  Discharge Physician: Oswald Hillock   PCP: Fredirick Lathe, PA-C   Recommendations at discharge:   Follow-up PCP as outpatient Will need CT chest to rule out malignancy, 8 mm lung nodule seen on CTA abdominal Also found right kidney lesion on CT CTA abdomen.  Will need MRI of the kidneys to rule out malignancy.  Discharge Diagnoses: Principal Problem:   GIB (gastrointestinal bleeding) Active Problems:   Pure hypercholesterolemia   Obstructive sleep apnea syndrome   Hypertension associated with diabetes ([place])   Controlled type 2 diabetes mellitus without complication, without long-term current use of insulin (HCC)   Benign prostatic hyperplasia with lower urinary tract symptoms   Abnormal CT of the abdomen  Resolved Problems:   * No resolved hospital problems. *  Hospital Course: 81 year old male with medical history of hyperlipidemia, OSA, hypertension, diabetes mellitus type 2, BPH presented with rectal bleeding.  Patient stated that he was having very dark stools and bright red blood covering them.  He was started on aspirin twice a day since he had left total hip arthroplasty in August 2023.  As per discharge summary from orthopedics he was supposed to be on aspirin twice a day only for 28 days for DVT prophylaxis. In the ED hemoglobin was 13.1.  CTA obtained showedGI bleed study showed small foci of active contrast extravasation at the splenic flexure.  Also noted with pulmonary nodule, renal nodules with MRI recommended, bronchial wall thickening and bronchiectasis.  GI consulted     Assessment and Plan:  Rectal bleeding -Appreciate GI evaluation, previous colonoscopy in 2016 had showed mild diverticulosis in the ascending transverse and descending colon -Likely has diverticular bleed, usually self-limited -No more bleeding for last  2 days, hemoglobin has remained stable -No plan for endoscopic procedure at this time   Lung nodule -8 mm lung nodule seen on the CTA abdomen -Needs outpatient follow-up with PCP for further evaluation - will need CT chest as outpatient   Right kidney lesion -Seen on CT abdomen -8 mm lesion upper pole right kidney; may be a complicated cyst with proteinaceous debris's or hemorrhage  need MRI of abdomen as outpatient   Hyperlipidemia -Continue atorvastatin   OSA -No longer using CPAP at home   Hypertension -Blood pressure is stable -Amlodipine and Aldactone have been restarted   BPH -Continue finasteride, tamsulosin   Diabetes mellitus type 2 -Continue sliding scale insulin with NovoLog -CBG well-controlled  Acute bronchitis -Started on doxycycline 100 mg p.o. twice daily for 5 days -Continue Robitussin 2 teaspoon every 4 hours as needed for cough -Albuterol inhaler  Bronchiectasis -Denies productive cough, no shortness of breath -Seen on CTA, outpatient follow-up with pulmonology       Consultants: Gastroenterology Procedures performed:   Disposition: Home Diet recommendation:  Discharge Diet Orders (From admission, onward)     Start     Ordered   01/11/22 0000  Diet - low sodium heart healthy        01/11/22 1310           Regular diet DISCHARGE MEDICATION: Allergies as of 01/11/2022       Reactions   Penicillin G Other (See Comments)   Childhood Reaction  Tolerated Cephalosporin Date: 09/05/21.        Medication List     TAKE these medications    albuterol 108 (  90 Base) MCG/ACT inhaler Commonly known as: VENTOLIN HFA Inhale 2 puffs into the lungs every 6 (six) hours as needed for wheezing or shortness of breath.   amLODipine 5 MG tablet Commonly known as: NORVASC Take 1 tablet (5 mg total) by mouth daily.   atorvastatin 40 MG tablet Commonly known as: LIPITOR Take 1 tablet (40 mg total) by mouth daily.   doxycycline 100 MG  tablet Commonly known as: VIBRA-TABS Take 1 tablet (100 mg total) by mouth every 12 (twelve) hours.   finasteride 5 MG tablet Commonly known as: PROSCAR Take 1 tablet (5 mg total) by mouth daily.   guaiFENesin-dextromethorphan 100-10 MG/5ML syrup Commonly known as: ROBITUSSIN DM Take 5 mLs by mouth every 4 (four) hours as needed for cough.   metFORMIN 500 MG tablet Commonly known as: GLUCOPHAGE Take 2 tablets (1,000 mg total) by mouth 2 (two) times daily with a meal.   silodosin 8 MG Caps capsule Commonly known as: RAPAFLO Take 1 capsule (8 mg total) by mouth daily.   spironolactone 25 MG tablet Commonly known as: ALDACTONE Take 1 tablet by mouth once daily        Discharge Exam: Filed Weights   01/11/22 1203  Weight: 77.1 kg   General-appears in no acute distress Heart-S1-S2, regular, no murmur auscultated Lungs-clear to auscultation bilaterally, no wheezing or crackles auscultated Abdomen-soft, nontender, no organomegaly Extremities-no edema in the lower extremities Neuro-alert, oriented x3, no focal deficit noted  Condition at discharge: good  The results of significant diagnostics from this hospitalization (including imaging, microbiology, ancillary and laboratory) are listed below for reference.   Imaging Studies: CT ANGIO GI BLEED  Result Date: 01/09/2022 CLINICAL DATA:  Rectal bleeding. EXAM: CTA ABDOMEN AND PELVIS WITHOUT AND WITH CONTRAST TECHNIQUE: Multidetector CT imaging of the abdomen and pelvis was performed using the standard protocol during bolus administration of intravenous contrast. Multiplanar reconstructed images and MIPs were obtained and reviewed to evaluate the vascular anatomy. RADIATION DOSE REDUCTION: This exam was performed according to the departmental dose-optimization program which includes automated exposure control, adjustment of the mA and/or kV according to patient size and/or use of iterative reconstruction technique. CONTRAST:  41m  OMNIPAQUE IOHEXOL 350 MG/ML SOLN COMPARISON:  None Available. FINDINGS: VASCULAR Aorta: Normal caliber aorta without aneurysm, dissection, vasculitis or significant stenosis. Mild atherosclerotic calcification evident. Celiac: Patent without evidence of aneurysm, dissection, vasculitis or significant stenosis. SMA: Patent without evidence of aneurysm, dissection, vasculitis or significant stenosis. Renals: Both renal arteries are patent without evidence of aneurysm, dissection, vasculitis, fibromuscular dysplasia or significant stenosis. Accessory right renal artery evident. IMA: Patent without evidence of aneurysm, dissection, vasculitis or significant stenosis. Inflow: Patent without evidence of aneurysm, dissection, vasculitis or significant stenosis. Proximal Outflow: Bilateral common femoral and visualized portions of the superficial and profunda femoral arteries are patent without evidence of aneurysm, dissection, vasculitis or significant stenosis. Veins: No obvious venous abnormality within the limitations of this arterial phase study. Review of the MIP images confirms the above findings. NON-VASCULAR Lower chest: Bronchial wall thickening with mild diffuse bronchiectasis noted in the lung bases bilaterally. 8 mm right lower lobe pulmonary nodule on image 1/series 15 is been incompletely visualized. Hepatobiliary: No suspicious focal abnormality within the liver parenchyma. There is no evidence for gallstones, gallbladder wall thickening, or pericholecystic fluid. No intrahepatic or extrahepatic biliary dilation. Pancreas: No focal mass lesion. No dilatation of the main duct. No intraparenchymal cyst. No peripancreatic edema. Spleen: No splenomegaly. No focal mass lesion. Adrenals/Urinary Tract: No adrenal  nodule or mass. 9.4 cm exophytic simple cyst noted lower pole left kidney 2.7 cm exophytic low-density lesion lower pole right kidney (38/4) has somewhat prominent ill-defined wall without definite  enhancing mural nodule. Other scattered hypoattenuating lesions in both kidneys are too small to characterize. 8 mm hyperattenuating lesion upper pole right kidney on 28/4 may be a tiny cyst complicated by proteinaceous debris or hemorrhage. No evidence for hydroureter. The urinary bladder appears normal for the degree of distention. Stomach/Bowel: Stomach is unremarkable. No gastric wall thickening. No evidence of outlet obstruction. Duodenum is normally positioned as is the ligament of Treitz. No small bowel wall thickening. No small bowel dilatation. The terminal ileum is normal. The appendix is normal. No gross colonic mass. No colonic wall thickening. No evidence for arterial phase contrast extravasation into the stomach or small bowel to suggest active arterial phase bleeding. There is a focus of active intraluminal extravasation in the splenic flexure of the colon (axial image 50/series 5 with subsequent accumulation of contrast in this region of colon on portal venous phase imaging (images 17-22 of series 11). No colonic wall mass lesion evident at this location features may be related to bleeding from an adjacent diverticulum or underlying vascular malformation/angiodysplasia. Lymphatic: There is no gastrohepatic or hepatoduodenal ligament lymphadenopathy. No retroperitoneal or mesenteric lymphadenopathy. No pelvic sidewall lymphadenopathy. Reproductive: The prostate gland and seminal vesicles are unremarkable. Other: No intraperitoneal free fluid. Musculoskeletal: Status post left total hip replacement. Bones are diffusely demineralized. IMPRESSION: 1. Small focus of active intraluminal arterial phase extravasation in the splenic flexure of the colon with subsequent accumulation of contrast in this region of colon on portal venous phase imaging. No colonic wall mass lesion evident at this location. Imaging features may be related to bleeding from an adjacent diverticulum or underlying vascular  malformation/angiodysplasia. 2. 8 mm right lower lobe pulmonary nodule, incompletely visualized. Dedicated CT chest without contrast recommended to further evaluate. 3. 2.7 cm exophytic low-density lesion lower pole right kidney has somewhat prominent ill-defined wall without definite enhancing mural nodule. Additional lesions in both kidneys evident, too small to characterize. MRI abdomen with and without contrast could be used to further evaluate. 4. 8 mm hyperattenuating lesion upper pole right kidney may be a tiny cyst complicated by proteinaceous debris or hemorrhage. This could also be assessed at the follow-up MRI. 5. Bronchial wall thickening with mild diffuse bronchiectasis in the lung bases bilaterally. I have personally made multiple attempts to contact the emergency room without answer. These results will be called to the ordering clinician or representative by the Radiologist Assistant, and communication documented in the PACS or [employer]. Electronically Signed   By: Misty Stanley M.D.   On: 01/09/2022 10:47    Microbiology: Results for orders placed or performed during the hospital encounter of 08/23/21  Surgical pcr screen     Status: Abnormal   Collection Time: 08/23/21 11:40 AM   Specimen: Nasal Mucosa; Nasal Swab  Result Value Ref Range Status   MRSA, PCR NEGATIVE NEGATIVE Final   Staphylococcus aureus POSITIVE (A) NEGATIVE Final    Comment: (NOTE) The Xpert SA Assay (FDA approved for NASAL specimens in patients 48 years of age and older), is one component of a comprehensive surveillance program. It is not intended to diagnose infection nor to guide or monitor treatment. Performed at [facility], [place] [address], [place], [place] [address]     Labs: CBC: Recent Labs  Lab 01/09/22 [phone] 01/09/22 1418 01/10/22 0811 01/10/22 1146  01/10/22 2016 01/11/22 0309 01/11/22 0838  WBC 9.6  --   --   --   --  8.8  --   HGB 15.0   < > 12.6* 11.9* 11.2*  11.8* 11.5*  HCT 46.2   < > 38.9* 34.9* 33.4* 36.1* 33.9*  MCV 93.3  --   --   --   --  93.3  --   PLT 289  --   --   --   --  234  --    < > = values in this interval not displayed.   Basic Metabolic Panel: Recent Labs  Lab 01/09/22 0851 01/10/22 0311 01/11/22 0309  NA 138 136 136  K 4.5 4.1 4.1  CL 103 104 104  CO2 '24 24 23  '$ GLUCOSE 187* 108* 105*  BUN 22 20 25*  CREATININE 1.13 1.19 1.05  CALCIUM 10.2 9.7 9.7   Liver Function Tests: Recent Labs  Lab 01/09/22 0851  AST 16  ALT 16  ALKPHOS 60  BILITOT 1.0  PROT 6.9  ALBUMIN 4.4   CBG: Recent Labs  Lab 01/10/22 1118 01/10/22 1620 01/10/22 1957 01/11/22 0826 01/11/22 1129  GLUCAP 110* 96 109* 128* 124*    Discharge time spent: greater than 30 minutes.  Signed: Oswald Hillock, MD [facility] 01/11/2022

## 2022-01-11 NOTE — Progress Notes (Signed)
Mobility Specialist - Progress Note   01/11/22 1000  Mobility  Activity Ambulated independently in hallway  Level of Assistance Independent  Assistive Device None  Distance Ambulated (ft) 550 ft  Activity Response Tolerated well  Mobility Referral No (Independent)  $Mobility charge 1 Mobility    Pt received in recliner agreeable to mobility. No complaints throughout, no physical assistance needed. Left in recliner w/ call bell within reach and all needs met.   [place] Specialist Please contact via SecureChat or Rehab office at [phone]

## 2022-01-11 NOTE — Progress Notes (Signed)
Pt discharged home in stable condition 

## 2022-01-16 ENCOUNTER — Telehealth: Payer: Self-pay | Admitting: Physician Assistant

## 2022-01-16 NOTE — Telephone Encounter (Signed)
Patient called for a refill of amlodipine. States wanted to make sure request was placed. Medication should be going to [facility] on [address], [place], [place].

## 2022-01-17 ENCOUNTER — Encounter: Payer: Self-pay | Admitting: *Deleted

## 2022-01-23 ENCOUNTER — Encounter: Payer: Self-pay | Admitting: Physician Assistant

## 2022-01-23 ENCOUNTER — Ambulatory Visit (INDEPENDENT_AMBULATORY_CARE_PROVIDER_SITE_OTHER): Payer: Medicare HMO | Admitting: Physician Assistant

## 2022-01-23 VITALS — BP 118/70 | HR 88 | Temp 97.3°F | Ht 66.0 in | Wt 168.8 lb

## 2022-01-23 DIAGNOSIS — J40 Bronchitis, not specified as acute or chronic: Secondary | ICD-10-CM

## 2022-01-23 DIAGNOSIS — E119 Type 2 diabetes mellitus without complications: Secondary | ICD-10-CM

## 2022-01-23 DIAGNOSIS — Z8719 Personal history of other diseases of the digestive system: Secondary | ICD-10-CM | POA: Diagnosis not present

## 2022-01-23 DIAGNOSIS — R911 Solitary pulmonary nodule: Secondary | ICD-10-CM

## 2022-01-23 DIAGNOSIS — N289 Disorder of kidney and ureter, unspecified: Secondary | ICD-10-CM | POA: Diagnosis not present

## 2022-01-23 LAB — HEMOGLOBIN A1C: Hgb A1c MFr Bld: 6.6 % — ABNORMAL HIGH (ref 4.6–6.5)

## 2022-01-23 LAB — CBC WITH DIFFERENTIAL/PLATELET
Basophils Absolute: 0.1 10*3/uL (ref 0.0–0.1)
Basophils Relative: 0.9 % (ref 0.0–3.0)
Eosinophils Absolute: 0.2 10*3/uL (ref 0.0–0.7)
Eosinophils Relative: 2.4 % (ref 0.0–5.0)
HCT: 36.2 % — ABNORMAL LOW (ref 39.0–52.0)
Hemoglobin: 12 g/dL — ABNORMAL LOW (ref 13.0–17.0)
Lymphocytes Relative: 9.8 % — ABNORMAL LOW (ref 12.0–46.0)
Lymphs Abs: 0.8 10*3/uL (ref 0.7–4.0)
MCHC: 33.1 g/dL (ref 30.0–36.0)
MCV: 92 fl (ref 78.0–100.0)
Monocytes Absolute: 1 10*3/uL (ref 0.1–1.0)
Monocytes Relative: 11.6 % (ref 3.0–12.0)
Neutro Abs: 6.4 10*3/uL (ref 1.4–7.7)
Neutrophils Relative %: 75.3 % (ref 43.0–77.0)
Platelets: 315 10*3/uL (ref 150.0–400.0)
RBC: 3.93 Mil/uL — ABNORMAL LOW (ref 4.22–5.81)
RDW: 13.7 % (ref 11.5–15.5)
WBC: 8.5 10*3/uL (ref 4.0–10.5)

## 2022-01-23 NOTE — Progress Notes (Signed)
Chief Complaint:  Danny Hunter is a 81 y.o. male who presents today for a TCM visit.   Subjective:  HPI:  Summary of Hospital admission: Reason for admission: GI bleeding Date of admission: 01/09/22 Date of discharge: 01/11/22 Date of Interactive contact: N/A Summary of Hospital course: Patient came into the office on 01/09/2022 for a regular follow-up visit.  He had informed me that he had developed black tarry and maroon-colored bleeding from his rectum that morning.  I sent him to the emergency department.  The CTA was performed and showed a small GI bleed at the splenic flexure.  Additional imaging was also recommended because of a pulmonary nodule and renal nodules.  GI was consulted.  GI diverticular bleed was self-limited.  Hemoglobin remained stable.  No endoscopic procedure was performed.  Patient was discharged in stable condition.  Interim history:  No issues with bleeding since hospital.   He has been feeling well, but has a cough. States it has been going on at least 6 weeks now.  He was given doxycycline prescription and an albuterol inhaler at discharge from hospital.  He is scheduling with his son's renal specialist in [place] to address lesion Right kidney 2.7 cm, as well as 8 mm upper pole right kidney lesion.   ROS: Cough still present., otherwise a complete review of systems was negative.   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   Tubular adenoma of colon    2011   Patient Active Problem List   Diagnosis Date Noted   Right lower lobe pulmonary nodule 01/23/2022   History of GI diverticular bleed 01/23/2022   Lesion of right native kidney  01/23/2022   Diverticular hemorrhage 01/11/2022   GIB (gastrointestinal bleeding) 01/09/2022   Abnormal CT of the abdomen 01/09/2022   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   Unilateral primary osteoarthritis, 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   Positive QuantiFERON-TB Gold test 02/21/2021   Arthritis of left hip 10/19/2020   Chronic left hip pain 10/19/2020   Pure hypercholesterolemia 03/10/2020   Obstructive sleep apnea syndrome 03/10/2020   Insomnia 03/10/2020   Hypertensive heart disease without congestive heart failure 03/10/2020   Essential tremor 03/10/2020   Hypertension associated with diabetes (HCC) 03/10/2020   Elevated PSA 03/10/2020   Controlled type 2 diabetes mellitus without complication, without long-term current use of insulin (HCC) 03/10/2020   Benign prostatic hyperplasia with lower urinary tract symptoms 10/15/2018   Shoulder arthritis 10/24/2011   Past Surgical History:  Procedure Laterality Date   CATARACT EXTRACTION  2019   SKIN CANCER EXCISION Right    ear   TOTAL HIP ARTHROPLASTY Left 09/04/2021   Procedure: TOTAL HIP ARTHROPLASTY ANTERIOR APPROACH;  Surgeon: Durene Romans, MD;  Location: [facility] ORS;  Service: Orthopedics;  Laterality: Left;   TOTAL SHOULDER ARTHROPLASTY  10/22/2011   Procedure: TOTAL SHOULDER ARTHROPLASTY;  Surgeon: Mable Paris, MD;  Location[facility];  Service: Orthopedics;  Laterality: Right;  right total shoulder arthroplasty    Family History  Problem Relation  Age of Onset   Cancer Mother    Heart disease Father    Cancer Son     Medications- Reconciled discharge and current medications in Epic.   Current Outpatient Medications  Medication Sig Dispense Refill   albuterol (VENTOLIN HFA) 108 (90 Base) MCG/ACT inhaler Inhale 2 puffs into the lungs every 6 (six) hours as needed for wheezing or shortness of breath. 8 g 2   amLODipine (NORVASC) 5 MG tablet Take 1 tablet by mouth once daily 90  tablet 0   atorvastatin (LIPITOR) 40 MG tablet Take 1 tablet (40 mg total) by mouth daily. 90 tablet 3   finasteride (PROSCAR) 5 MG tablet Take 1 tablet (5 mg total) by mouth daily. 90 tablet 1   guaiFENesin-dextromethorphan (ROBITUSSIN DM) 100-10 MG/5ML syrup Take 5 mLs by mouth every 4 (four) hours as needed for cough. 118 mL 0   metFORMIN (GLUCOPHAGE) 500 MG tablet Take 2 tablets (1,000 mg total) by mouth 2 (two) times daily with a meal. 360 tablet 1   silodosin (RAPAFLO) 8 MG CAPS capsule Take 1 capsule (8 mg total) by mouth daily. 30 capsule 1   spironolactone (ALDACTONE) 25 MG tablet Take 1 tablet by mouth once daily 90 tablet 0   doxycycline (VIBRA-TABS) 100 MG tablet Take 1 tablet (100 mg total) by mouth every 12 (twelve) hours. (Patient not taking: Reported on 01/23/2022) 10 tablet 0   No current facility-administered medications for this visit.    Allergies-reviewed and updated Allergies  Allergen Reactions   Penicillin G Other (See Comments)    Childhood Reaction  Tolerated Cephalosporin Date: 09/05/21.      Social History   Socioeconomic History   Marital status: Married    Spouse name: Not on file   Number of children: Not on file   Years of education: Not on file   Highest education level: Not on file  Occupational History   Occupation: Retired  Tobacco Use   Smoking status: Never   Smokeless tobacco: Never  Vaping Use   Vaping Use: Never used  Substance and Sexual Activity   Alcohol use: Yes    Comment: 1 drink a month   Drug use: No   Sexual activity: Not Currently  Other Topics Concern   Not on file  Social History Narrative   Left handed   Social Determinants of Health   Financial Resource Strain: Low Risk  (10/29/2021)   Overall Financial Resource Strain (CARDIA)    Difficulty of Paying Living Expenses: Not hard at all  Food Insecurity: No Food Insecurity (01/11/2022)   Hunger Vital Sign    Worried About Running Out of Food in the Last Year: Never  true    Ran Out of Food in the Last Year: Never true  Transportation Needs: No Transportation Needs (01/11/2022)   PRAPARE - Administrator, Civil Service (Medical): No    Lack of Transportation (Non-Medical): No  Physical Activity: Sufficiently Active (10/29/2021)   Exercise Vital Sign    Days of Exercise per Week: 4 days    Minutes of Exercise per Session: 60 min  Stress: No Stress Concern Present (10/29/2021)   [employer] of Occupational Health - Occupational Stress Questionnaire    Feeling of Stress : Not at all  Social Connections: Moderately Integrated (10/29/2021)   Social Connection and Isolation Panel [NHANES]    Frequency of Communication with Friends and Family: More than three times a week    Frequency of Social Gatherings with  Friends and Family: More than three times a week    Attends Religious Services: Never    Database administrator or Organizations: Yes    Attends Engineer, structural: 1 to 4 times per year    Marital Status: Married        Objective:  Physical Exam: BP 118/70 (BP Location: Left Arm)   Pulse 88   Temp (!) 97.3 F (36.3 C) (Temporal)   Ht 5\' 6"  (1.676 m)   Wt 168 lb 12.8 oz (76.6 kg)   SpO2 97%   BMI 27.25 kg/m   Gen: NAD, resting comfortably CV: RRR with no murmurs appreciated Pulm: NWOB, CTAB with no crackles, wheezes, or rhonchi GI: Normal bowel sounds present. Soft, Nontender, Nondistended. MSK: No edema, cyanosis, or clubbing noted Skin: Warm, dry Neuro: Grossly normal, moves all extremities Psych: Normal affect and thought content  Assessment/Plan:  New/Acute Problems: Diverticular bleed -resolved.  Patient wants to follow-up with GI.  Referral placed today.  Plan to repeat CBC today.  Right lower lobe pulmonary nodule -ordered CT of chest.  Follow-up on this pending results.  Kidney lesions - pt to follow up with nephrologist in [place] whom his son sees   Chronic Problems Addressed Today: T2DM -  recheck A1c today; patient has been doing well with lifestyle.  Continues to take 1000 mg of metformin twice daily.   Patient has a high level of medical decision making.      Summary/Review of work up during hospitalization: I personally reviewed patient's lab work, including CBC and hemoglobin trends.  I also reviewed patient's CT angio GI bleed.     Michelle Wnek, PA-C

## 2022-01-24 LAB — GLUCOSE, CAPILLARY: Glucose-Capillary: 140 mg/dL — ABNORMAL HIGH (ref 70–99)

## 2022-02-03 ENCOUNTER — Other Ambulatory Visit: Payer: Self-pay

## 2022-02-03 ENCOUNTER — Emergency Department (HOSPITAL_BASED_OUTPATIENT_CLINIC_OR_DEPARTMENT_OTHER)
Admission: EM | Admit: 2022-02-03 | Discharge: 2022-02-03 | Disposition: A | Discharge status: Home / Self Care | Payer: Medicare HMO | Attending: Emergency Medicine | Admitting: Emergency Medicine

## 2022-02-03 ENCOUNTER — Encounter (HOSPITAL_BASED_OUTPATIENT_CLINIC_OR_DEPARTMENT_OTHER): Payer: Self-pay | Admitting: Emergency Medicine

## 2022-02-03 DIAGNOSIS — Z79899 Other long term (current) drug therapy: Secondary | ICD-10-CM | POA: Insufficient documentation

## 2022-02-03 DIAGNOSIS — E119 Type 2 diabetes mellitus without complications: Secondary | ICD-10-CM | POA: Diagnosis not present

## 2022-02-03 DIAGNOSIS — Z7984 Long term (current) use of oral hypoglycemic drugs: Secondary | ICD-10-CM | POA: Diagnosis not present

## 2022-02-03 DIAGNOSIS — I10 Essential (primary) hypertension: Secondary | ICD-10-CM | POA: Insufficient documentation

## 2022-02-03 DIAGNOSIS — U071 COVID-19: Secondary | ICD-10-CM | POA: Insufficient documentation

## 2022-02-03 LAB — BASIC METABOLIC PANEL
Anion gap: 13 (ref 5–15)
Anion gap: 8 (ref 5–15)
BUN: 19 mg/dL (ref 8–23)
BUN: 18 mg/dL (ref 8–23)
CO2: 17 mmol/L — ABNORMAL LOW (ref 22–32)
CO2: 24 mmol/L (ref 22–32)
Calcium: 10.1 mg/dL (ref 8.9–10.3)
Calcium: 10.2 mg/dL (ref 8.9–10.3)
Chloride: 105 mmol/L (ref 98–111)
Chloride: 104 mmol/L (ref 98–111)
Creatinine, Ser: 1.09 mg/dL (ref 0.61–1.24)
Creatinine, Ser: 1 mg/dL (ref 0.61–1.24)
GFR, Estimated: >60 mL/min (ref >60)
GFR, Estimated: >60 mL/min (ref >60)
Glucose, Bld: 140 mg/dL — ABNORMAL HIGH (ref 70–99)
Glucose, Bld: 109 mg/dL — ABNORMAL HIGH (ref 70–99)
Potassium: 5.4 mmol/L — ABNORMAL HIGH (ref 3.5–5.1)
Potassium: 4.3 mmol/L (ref 3.5–5.1)
Sodium: 135 mmol/L (ref 135–145)
Sodium: 136 mmol/L (ref 135–145)

## 2022-02-03 LAB — CBC
HCT: 37.5 % — ABNORMAL LOW (ref 39.0–52.0)
Hemoglobin: 12 g/dL — ABNORMAL LOW (ref 13.0–17.0)
MCH: 29.4 pg (ref 26.0–34.0)
MCHC: 32 g/dL (ref 30.0–36.0)
MCV: 91.9 fL (ref 80.0–100.0)
Platelets: 215 10*3/uL (ref 150–400)
RBC: 4.08 MIL/uL — ABNORMAL LOW (ref 4.22–5.81)
RDW: 13.6 % (ref 11.5–15.5)
WBC: 9 10*3/uL (ref 4.0–10.5)
nRBC: 0 % (ref 0.0–0.2)

## 2022-02-03 MED ORDER — NIRMATRELVIR/RITONAVIR (PAXLOVID)TABLET: 3.0000 | ORAL_TABLET | Freq: Two times a day (BID) | ORAL | 0 refills | Status: AC

## 2022-02-03 NOTE — ED Triage Notes (Signed)
Cough,runny nose for 2 days, had covid + exposure, tested positive this am, interested in antiviral .

## 2022-02-03 NOTE — Discharge Instructions (Addendum)
You have COVID-19.  You are being prescribed Paxlovid which can help prevent progression to more severe disease.  It can interact with several of your medications.  Stop taking your Silodosin (Rapaflo) the medication for your BPH.  You can restart it after 3 days of past since you have taking the medication.  Taking these together could make you dizzy and drop your blood pressure.  Please cut your blood pressure medication Amlodipine in half and you can restart it your usual dose 3 days after the last dose of your Paxlovid as well.  If you notice that you are feeling dizzy or lightheaded or you check your blood pressure and it is low stop taking the amlodipine altogether while you take this medication.  Do not take the atorvastatin (Lipitor) he can restart it 3 days after your last dose of Paxlovid.  Back to the ER for any new or worsening symptoms.

## 2022-02-03 NOTE — ED Provider Notes (Signed)
[facility] EMERGENCY DEPT Provider Note   CSN: [id number] Arrival date & time: 02/03/22  3825     History  Chief Complaint  Patient presents with   Covid Positive    Danny Hunter is a 81 y.o. male.  Past medical history of hypertension diabetes and high cholesterol.  He presents the ER for positive COVID test at home.  He states he had a cough and mild runny nose for the past 2 days and had a positive COVID test.  He states he is feeling well but only came in because he was wanting the antiviral pills to help prevent progression to more severe disease.  He denies chest pain shortness of breath purulent sputum production fevers or any other associated symptoms.  HPI     Home Medications Prior to Admission medications   Medication Sig Start Date End Date Taking? Authorizing Provider  albuterol (VENTOLIN HFA) 108 (90 Base) MCG/ACT inhaler Inhale 2 puffs into the lungs every 6 (six) hours as needed for wheezing or shortness of breath. 01/11/22   Oswald Hillock, MD  amLODipine (NORVASC) 5 MG tablet Take 1 tablet by mouth once daily 01/17/22   Allwardt, Alyssa M, PA-C  atorvastatin (LIPITOR) 40 MG tablet Take 1 tablet (40 mg total) by mouth daily. 06/14/21   Allwardt, Randa Evens, PA-C  doxycycline (VIBRA-TABS) 100 MG tablet Take 1 tablet (100 mg total) by mouth every 12 (twelve) hours. Patient not taking: Reported on 01/23/2022 01/11/22   Oswald Hillock, MD  finasteride (PROSCAR) 5 MG tablet Take 1 tablet (5 mg total) by mouth daily. 05/10/21   Allwardt, Alyssa M, PA-C  guaiFENesin-dextromethorphan (ROBITUSSIN DM) 100-10 MG/5ML syrup Take 5 mLs by mouth every 4 (four) hours as needed for cough. 01/11/22   Oswald Hillock, MD  metFORMIN (GLUCOPHAGE) 500 MG tablet Take 2 tablets (1,000 mg total) by mouth 2 (two) times daily with a meal. 10/15/21   Allwardt, Randa Evens, PA-C  silodosin (RAPAFLO) 8 MG CAPS capsule Take 1 capsule (8 mg total) by mouth daily. 10/15/21   Allwardt, Randa Evens,  PA-C  spironolactone (ALDACTONE) 25 MG tablet Take 1 tablet by mouth once daily 01/01/22   Allwardt, Randa Evens, PA-C      Allergies    Patient has no active allergies.    Review of Systems   Review of Systems  HENT:  Positive for rhinorrhea.   Respiratory:  Positive for cough.     Physical Exam Updated Vital Signs BP 138/71   Pulse 82   Temp 98.7 F (37.1 C) (Oral)   Resp 20   SpO2 96%  Physical Exam Vitals and nursing note reviewed.  Constitutional:      General: He is not in acute distress.    Appearance: He is well-developed.  HENT:     Head: Normocephalic and atraumatic.  Eyes:     Conjunctiva/sclera: Conjunctivae normal.  Cardiovascular:     Rate and Rhythm: Normal rate and regular rhythm.     Heart sounds: No murmur heard. Pulmonary:     Effort: Pulmonary effort is normal. No respiratory distress.     Breath sounds: Normal breath sounds.  Abdominal:     Palpations: Abdomen is soft.     Tenderness: There is no abdominal tenderness.  Musculoskeletal:        General: No swelling.     Cervical back: Neck supple.  Skin:    General: Skin is warm and dry.     Capillary Refill:  Capillary refill takes less than 2 seconds.  Neurological:     Mental Status: He is alert.  Psychiatric:        Mood and Affect: Mood normal.     ED Results / Procedures / Treatments   Labs (all labs ordered are listed, but only abnormal results are displayed) Labs Reviewed  BASIC METABOLIC PANEL - Abnormal; Notable for the following components:      Result Value   Potassium 5.4 (*)    CO2 17 (*)    Glucose, Bld 140 (*)    All other components within normal limits  CBC - Abnormal; Notable for the following components:   RBC 4.08 (*)    Hemoglobin 12.0 (*)    HCT 37.5 (*)    All other components within normal limits  BASIC METABOLIC PANEL    EKG None  Radiology No results found.  Procedures Procedures    Medications Ordered in ED Medications - No data to  display  ED Course/ Medical Decision Making/ A&P                           Medical Decision Making Differential diagnosis: COVID-19, influenza, RSV, bronchitis, pneumonia, other  ED Course: Patient came in with positive home COVID test.  Over the nose and cough for 2 days and is feeling well, he has reassuring vitals and exam.  Basic labs had initially been ordered.  These did reveal low CO2 and slightly elevated potassium on interpretation.  Patient has no renal disease, he is on a potassium sparing diuretic but otherwise has no reason to have hyperkalemia so the lab was redrawn to see if this was potentially a lab error or hemolysis.  Repeat BMP was normal.  Patient has normal GFR so he is able to take Paxlovid.  I entered all of his medications and to the [facility].  He was advised to stop taking his Rapaflo and Lipitor until 3 days after he has stopped the medication and to cut his amlodipine in half until 3 days after he stopped the medication.  These were provided in written and verbal form and patient understanding of the instructions.  He was given strict return precautions.  Amount and/or Complexity of Data Reviewed Labs: ordered.           Final Clinical Impression(s) / ED Diagnoses Final diagnoses:  None    Rx / DC Orders ED Discharge Orders     None         Gwenevere Abbot, PA-C 02/03/22 1411    Gareth Morgan, MD 02/03/22 2259

## 2022-02-21 IMAGING — CT CT HEAD W/O CM
3 of 4 series · 14 of 47 positions shown, 16 images · non-contrast
Comparison: Brain MRI 05/16/2003.  Head CT 05/11/2003.

CLINICAL DATA: Focal neuro deficit, greater than 6 hours, stroke
suspected. Additional history provided: Dizziness for 2 days,
unsteady gait, elevated blood pressure.

EXAM:
CT HEAD WITHOUT CONTRAST
TECHNIQUE: Contiguous axial images were obtained from the base of the skull
through the vertex without intravenous contrast.

[Series 4: head 2.0 h70h · axial · 0.48mm/px · z∈[-40,+90]mm · 8 of 83 slices shown, 10 images]
[im 9/83  brain]
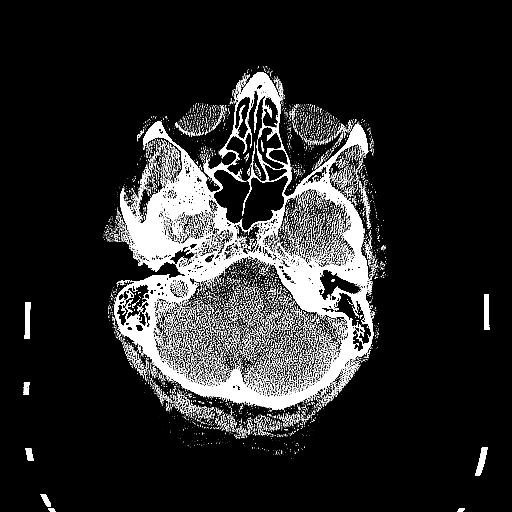
[im 9/83  bone]
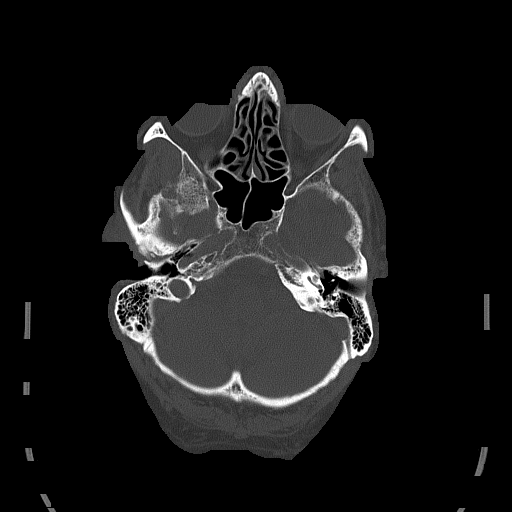
[im 17/83  brain]
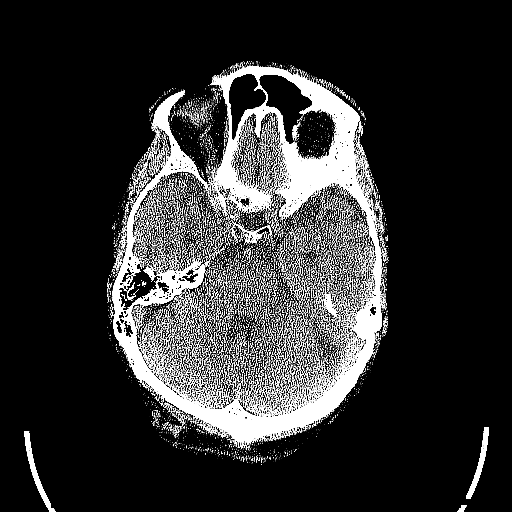
[im 25/83  brain]
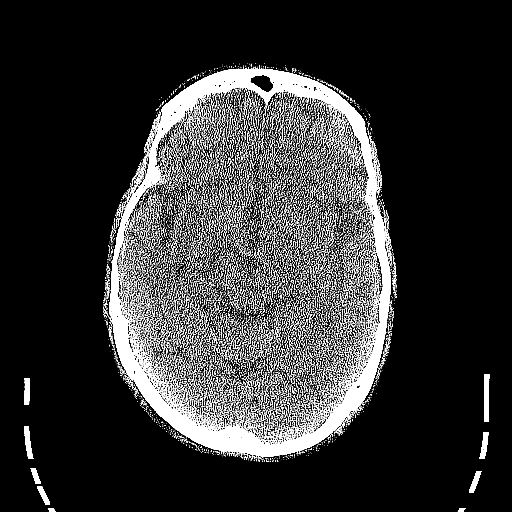
[im 37/83  brain]
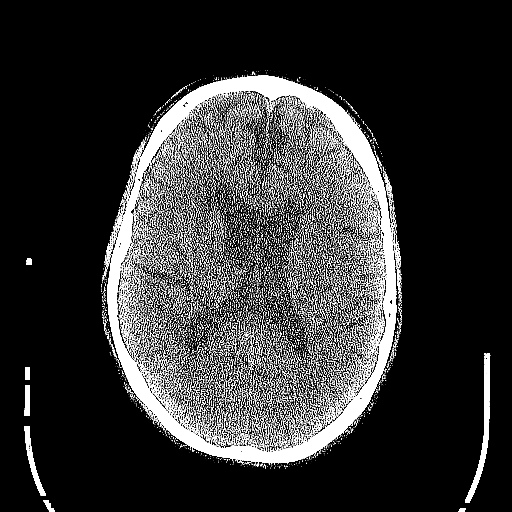
[im 46/83  brain]
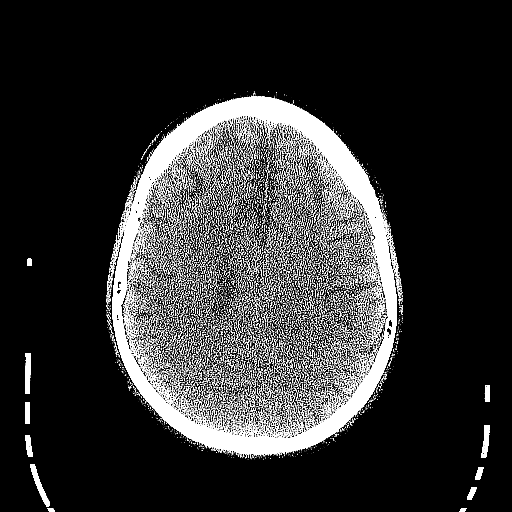
[im 46/83  bone]
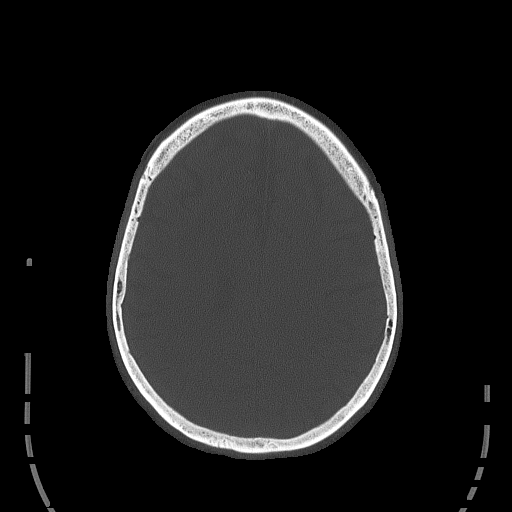
[im 58/83  brain]
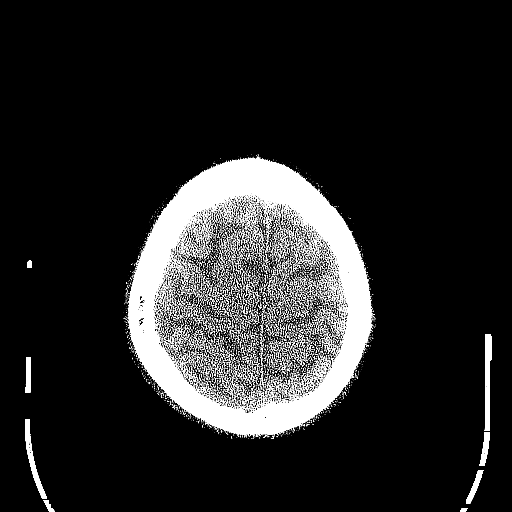
[im 66/83  brain]
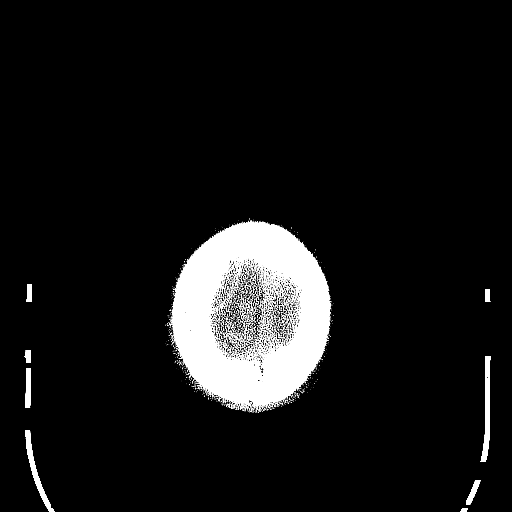
[im 74/83  brain]
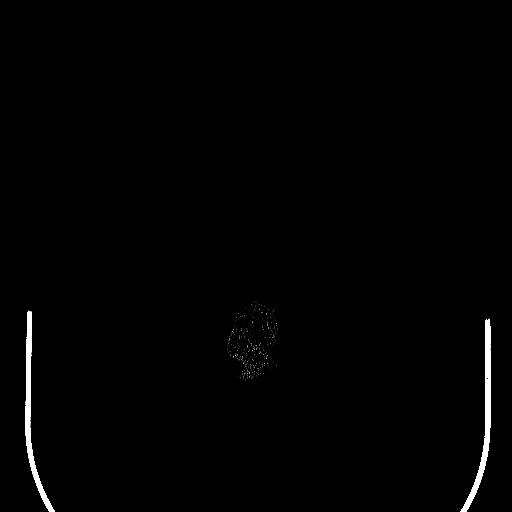

[Series 5: head 3.0 mpr cor · coronal · 0.31mm/px · 3 of 71 slices shown]
[im 24/71  brain]
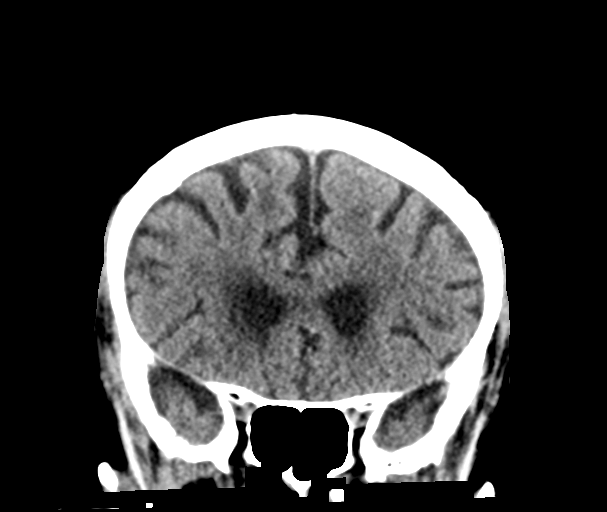
[im 32/71  brain]
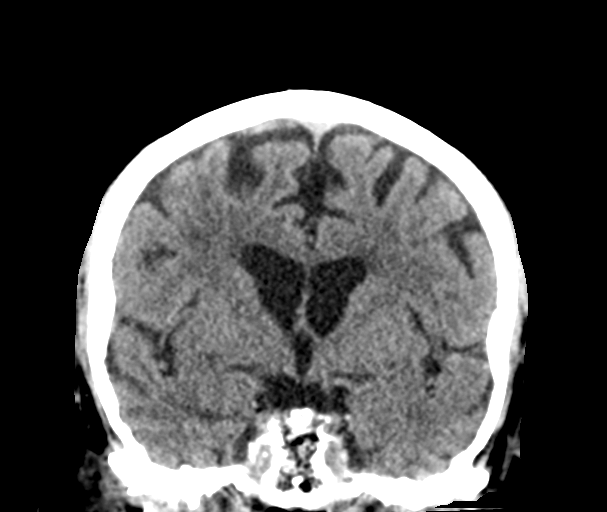
[im 39/71  brain]
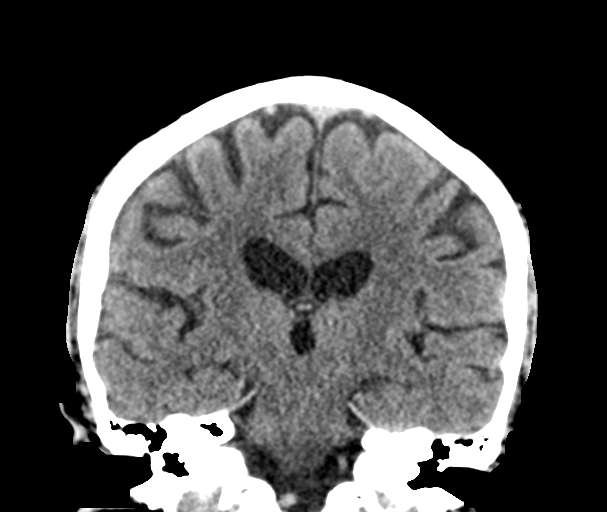

[Series 6: head 3.0 mpr sag · sagittal · 0.32mm/px · 3 of 67 slices shown]
[im 23/67  brain]
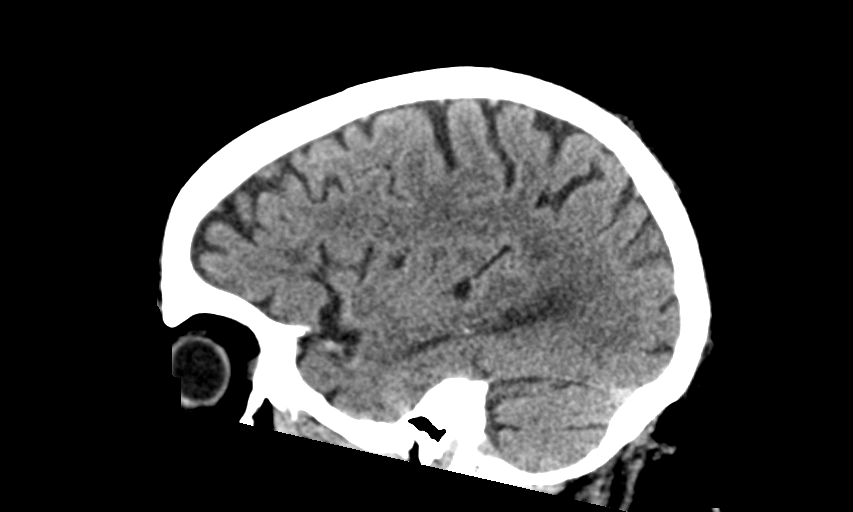
[im 34/67  brain]
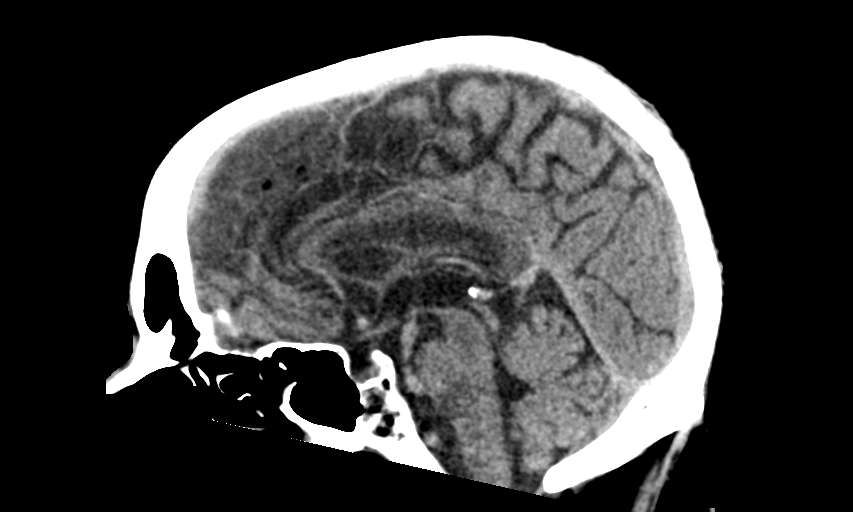
[im 45/67  brain]
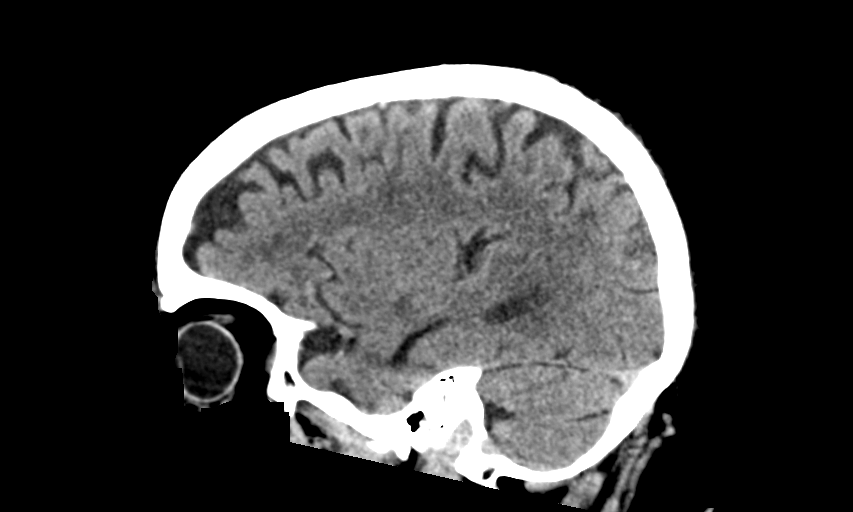

[14 of 47 positions shown; findings below may reference images not displayed]

FINDINGS: Brain:

Mild generalized cerebral atrophy.

Mild ill-defined hypoattenuation within the cerebral white matter is
nonspecific, but compatible with chronic small vessel ischemic
disease.

There is no acute intracranial hemorrhage.

No demarcated cortical infarct.

No extra-axial fluid collection.

No evidence of intracranial mass.

No midline shift.

Vascular: No hyperdense vessel.  Atherosclerotic calcifications.

Skull: Normal. Negative for fracture or focal lesion.

Sinuses/Orbits: Visualized orbits show no acute finding. Mild
ethmoid sinus mucosal thickening. No significant mastoid effusion.
IMPRESSION: No evidence of acute intracranial abnormality.

Mild cerebral atrophy and chronic small vessel ischemic disease,
progressed as compared to prior examinations in 1779.

## 2022-02-25 DIAGNOSIS — R351 Nocturia: Secondary | ICD-10-CM | POA: Diagnosis not present

## 2022-02-25 DIAGNOSIS — N5201 Erectile dysfunction due to arterial insufficiency: Secondary | ICD-10-CM | POA: Diagnosis not present

## 2022-02-25 DIAGNOSIS — N401 Enlarged prostate with lower urinary tract symptoms: Secondary | ICD-10-CM | POA: Diagnosis not present

## 2022-03-05 ENCOUNTER — Ambulatory Visit (HOSPITAL_BASED_OUTPATIENT_CLINIC_OR_DEPARTMENT_OTHER)
Admission: RE | Admit: 2022-03-05 | Discharge: 2022-03-05 | Disposition: A | Discharge status: Home / Self Care | Payer: Medicare HMO | Source: Ambulatory Visit | Attending: Physician Assistant | Admitting: Physician Assistant

## 2022-03-05 DIAGNOSIS — R911 Solitary pulmonary nodule: Secondary | ICD-10-CM | POA: Insufficient documentation

## 2022-03-05 DIAGNOSIS — J479 Bronchiectasis, uncomplicated: Secondary | ICD-10-CM | POA: Diagnosis not present

## 2022-03-05 DIAGNOSIS — R918 Other nonspecific abnormal finding of lung field: Secondary | ICD-10-CM | POA: Diagnosis not present

## 2022-03-07 ENCOUNTER — Telehealth: Payer: Self-pay | Admitting: Physician Assistant

## 2022-03-07 NOTE — Telephone Encounter (Signed)
Pt would like a call back to discuss the results of CT. Please advise.

## 2022-03-08 NOTE — Telephone Encounter (Signed)
Pt has been advised and spoke to regarding results of CT; please see result notes

## 2022-03-15 ENCOUNTER — Encounter: Payer: Self-pay | Admitting: Physician Assistant

## 2022-03-15 ENCOUNTER — Ambulatory Visit (INDEPENDENT_AMBULATORY_CARE_PROVIDER_SITE_OTHER): Payer: Medicare HMO | Admitting: Physician Assistant

## 2022-03-15 VITALS — BP 128/80 | HR 79 | Temp 98.2°F | Ht 66.0 in | Wt 171.2 lb

## 2022-03-15 DIAGNOSIS — N2889 Other specified disorders of kidney and ureter: Secondary | ICD-10-CM | POA: Diagnosis not present

## 2022-03-15 DIAGNOSIS — Z8679 Personal history of other diseases of the circulatory system: Secondary | ICD-10-CM

## 2022-03-15 DIAGNOSIS — E1159 Type 2 diabetes mellitus with other circulatory complications: Secondary | ICD-10-CM

## 2022-03-15 DIAGNOSIS — E7800 Pure hypercholesterolemia, unspecified: Secondary | ICD-10-CM

## 2022-03-15 DIAGNOSIS — I152 Hypertension secondary to endocrine disorders: Secondary | ICD-10-CM

## 2022-03-15 NOTE — Progress Notes (Signed)
Subjective:    Patient ID: Danny Hunter, male    DOB: 1941-01-09, 82 y.o.   MRN: [id number]  Chief Complaint  Patient presents with   imaging results     Pt here for chest XRay    HPI Patient is in today with his son for discussion about recent CT results from 03/05/22. No new concerns or issues.    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   Procedure: TOTAL HIP ARTHROPLASTY ANTERIOR APPROACH;  Surgeon: Paralee Cancel, MD;  Location: [facility];  Service: Orthopedics;  Laterality: Left;   TOTAL SHOULDER ARTHROPLASTY  10/22/2011   Procedure: TOTAL SHOULDER ARTHROPLASTY;  Surgeon: Nita Sells, MD;  Location: [facility];  Service: Orthopedics;  Laterality: Right;  right total shoulder arthroplasty    Family History  Problem Relation Age of Onset   Cancer Mother    Heart disease Father    Cancer Son     Social History   Tobacco Use   Smoking status: Never   Smokeless tobacco: Never  Vaping Use   Vaping Use: Never used  Substance Use Topics   Alcohol use: Yes    Comment: 1 drink a month   Drug use: No     No Known Allergies  Review of Systems NEGATIVE UNLESS OTHERWISE INDICATED IN HPI      Objective:     BP 128/80 (BP Location: Left Arm, Patient Position: Sitting)   Pulse 79   Temp 98.2 F (36.8 C) (Temporal)   Ht 5' 6"$  (1.676 m)   Wt 171 lb 3.2 oz (77.7 kg)   SpO2 94%   BMI 27.63 kg/m   Wt Readings from Last 3 Encounters:  03/15/22 171 lb 3.2 oz (77.7 kg)  01/23/22 168 lb 12.8 oz (76.6 kg)  01/11/22 170 lb  (77.1 kg)    BP Readings from Last 3 Encounters:  03/15/22 128/80  02/03/22 138/71  01/23/22 118/70     Physical Exam Vitals and nursing note reviewed.  Constitutional:      Appearance: Normal appearance.  Cardiovascular:     Rate and Rhythm: Normal rate and regular rhythm.     Pulses: Normal pulses.     Heart sounds: Normal heart sounds.  Pulmonary:     Effort: Pulmonary effort is normal.     Breath sounds: Normal breath sounds.  Neurological:     General: No focal deficit present.     Mental Status: He is alert and oriented to person, place, and time.  Psychiatric:        Mood and Affect: Mood normal.        Behavior: Behavior normal.        Assessment & Plan:  Personal history of coronary atherosclerosis -     Ambulatory referral to Cardiology  Other specified disorders of kidney and ureter -     MR ABDOMEN  W WO CONTRAST; Future  Left renal mass -     MR ABDOMEN W WO CONTRAST; Future  Hypertension associated with diabetes [facility]) -     Ambulatory referral to Cardiology  Pure hypercholesterolemia -     Ambulatory referral to Cardiology   Great discussion with patient and son today about CT chest results as listed below.  He is most concerned about the significant plaque buildup and would like to have his heart checked out. Requesting referral to cardiologist, sent this today. He is asymptomatic.  Also, interestingly, one son has renal cancer, other son had an area of concern noted (which was normal) on his kidney, and his wife has a similar lesion on her kidney. Will proceed with MRI imaging to further evaluate patient's renal lesions.  He will continue on Lipitor 40 mg daily. He will continue regular exercise.  He will continue amlodipine 5 mg and spironolactone 25 mg daily, BP is normotensive.    CT Chest w/o contrast 03/05/2022:  Narrative & Impression  CLINICAL DATA:  Follow-up for pulmonary nodule.   EXAM: CT CHEST WITHOUT CONTRAST    TECHNIQUE: Multidetector CT imaging of the chest was performed following the standard protocol without IV contrast.   RADIATION DOSE REDUCTION: This exam was performed according to the departmental dose-optimization program which includes automated exposure control, adjustment of the mA and/or kV according to patient size and/or use of iterative reconstruction technique.   COMPARISON:  CT angio 01/09/2022   FINDINGS: Cardiovascular: Heart size is normal. There is dense atherosclerotic calcification coronary arteries. There are focal calcifications of the thoracic aorta, not associated with aneurysm or evidence for dissection. The noncontrast appearance of the pulmonary arteries is unremarkable.   Mediastinum/Nodes: Esophagus is normal. The visualized portion of the thyroid gland has a normal appearance. No significant mediastinal, hilar, or axillary adenopathy.   Lungs/Pleura: Airways are patent. There is mild bronchial wall thickening. Bilateral LOWER lobe bronchiectasis is similar to prior study. There are linear areas of scar primarily involving the LOWER lobes, RIGHT greater than LEFT. Bibasilar subpleural reticular changes are again noted. Within the RIGHT LOWER lobe, adjacent to linear scarring, there is a pulmonary nodule measuring 7 x 3 millimeters (5 millimeters mean) on image 105 of series 6. Within the LATERAL aspect of the RIGHT UPPER lobe, there are 3 small pulmonary nodules, measuring 4 millimeters and smaller. There are no consolidations. No pleural effusions or evidence for pulmonary edema.   Upper Abdomen: Gallbladder is present. Visualized portion of the liver is unremarkable. The kidneys are partially imaged. Numerous low-attenuation lesions within the kidneys are indeterminate. MRI has been recommended for assessment of a 2.7 centimeter LEFT LOWER pole mass an 8 millimeter hyperattenuating mass in the UPPER pole of the RIGHT kidney. These lesions are not  identified on today's exam.   Musculoskeletal: No acute abnormality. Numerous thoracic spine Schmorl's nodes.   IMPRESSION: 1. Stable appearance of bilateral LOWER lobe bronchiectasis and subpleural reticular changes. 2. Stable appearance of multiple small pulmonary nodules, largest measuring 5 millimeters. No follow-up needed if patient is low-risk.This recommendation follows the consensus statement: Guidelines for Management of Incidental Pulmonary Nodules Detected on CT Images: From the Fleischner Society 2017; Radiology 2017; 284:228-243. 3. Numerous low-attenuation lesions within the kidneys are indeterminate. MRI has been recommended for assessment of a 2.7 centimeter LEFT LOWER pole mass an 8 millimeter hyperdense mass in the UPPER pole of the RIGHT kidney. These lesions are not imaged on today's exam. 4. Coronary artery disease. 5.  Aortic Atherosclerosis (ICD10-I70.0).      This note was prepared with assistance of Systems analyst. Occasional wrong-word or sound-a-like substitutions may have occurred due to the inherent limitations of voice recognition software.  Time Spent: 30 minutes of total time was spent on the date of the encounter performing the following actions: chart review prior to seeing the patient, obtaining history, performing a medically necessary exam, counseling on the treatment plan, placing orders, and documenting in our EHR.       Kilynn Fitzsimmons M Therisa Mennella, PA-C

## 2022-03-30 ENCOUNTER — Other Ambulatory Visit: Payer: Self-pay | Admitting: Physician Assistant

## 2022-03-31 ENCOUNTER — Ambulatory Visit
Admission: RE | Admit: 2022-03-31 | Discharge: 2022-03-31 | Disposition: A | Discharge status: Home / Self Care | Payer: Medicare HMO | Source: Ambulatory Visit | Attending: Physician Assistant | Admitting: Physician Assistant

## 2022-03-31 DIAGNOSIS — N281 Cyst of kidney, acquired: Secondary | ICD-10-CM | POA: Diagnosis not present

## 2022-03-31 DIAGNOSIS — N2889 Other specified disorders of kidney and ureter: Secondary | ICD-10-CM

## 2022-03-31 DIAGNOSIS — K5730 Diverticulosis of large intestine without perforation or abscess without bleeding: Secondary | ICD-10-CM | POA: Diagnosis not present

## 2022-03-31 DIAGNOSIS — I771 Stricture of artery: Secondary | ICD-10-CM | POA: Diagnosis not present

## 2022-03-31 MED ORDER — GADOPICLENOL 0.5 MMOL/ML IV SOLN
8.0000 mL | Freq: Once | INTRAVENOUS | Status: AC | PRN
  Administered 2022-03-31: 8 mL via INTRAVENOUS

## 2022-04-15 NOTE — Progress Notes (Signed)
Cardiology Office Note:    Date:  04/17/2022   ID:  Danny Hunter, DOB [birth year], MRN [id number]  PCP:  Danny Lathe, PA-C  [facility] Cardiologist:  Danny Bergeron, MD  [facility] Electrophysiologist:  None   Referring MD: Danny Lathe, PA-C    History of Present Illness:    Danny Hunter is a 82 y.o. male with a hx of anxiety, HLD, HTN and DMII who presents to clinic for follow-up.  Was last seen in clinic in 11/2020 by Danny Memos, NP where he was doing well and remained active.   Today, the patient overall feels well today. No chest pain, SOB, orthopnea, PND or LE edema. [facility]. Has some pain in his legs with walking but it improves with more walking. States that he was told he had coronary artery disease on his CT scan which he wished to discuss further.   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   amLODipine (NORVASC) 5 MG tablet Take 1 tablet by mouth once daily   atorvastatin (LIPITOR) 40 MG tablet Take 1 tablet (40 mg total) by mouth daily.   finasteride (PROSCAR) 5 MG tablet Take 1 tablet (5 mg total) by mouth daily.   metFORMIN (GLUCOPHAGE) 500 MG tablet Take 2 tablets (1,000 mg total) by mouth 2 (two) times daily with a meal.   silodosin (RAPAFLO) 8 MG CAPS capsule Take 1 capsule (8 mg total) by mouth daily.   spironolactone (ALDACTONE) 25 MG tablet Take 1 tablet by mouth once daily     Allergies:   Patient has no known allergies.   Social History   Socioeconomic History   Marital status: Married    Spouse name: Not on file   Number of children: Not on file   Years of education: Not on file   Highest education level: Not on file  Occupational History   Occupation: Retired  Tobacco Use   Smoking status: Never   Smokeless tobacco: Never  Vaping Use   Vaping Use: Never used  Substance and Sexual Activity   Alcohol use: Yes    Comment: 1 drink a month   Drug use: No   Sexual activity: Not Currently  Other Topics Concern   Not on file  Social History Narrative   Left handed   Social Determinants of Health   Financial Resource Strain: Low Risk  (10/29/2021)   Overall Financial Resource Strain (CARDIA)    Difficulty of Paying Living Expenses: Not hard at all  Food Insecurity: No Food Insecurity (01/11/2022)   Hunger Vital Sign    Worried About Running Out of Food in the Last Year: Never true    Ran Out of Food in the Last Year: Never true  Transportation Needs: No Transportation Needs (01/11/2022)   PRAPARE - Hydrologist (Medical): No    Lack of Transportation (Non-Medical): No  Physical Activity: Sufficiently Active (10/29/2021)   Exercise Vital Sign    Days of Exercise per Week: 4 days    Minutes of Exercise per Session: 60 min  Stress: No Stress Concern Present (10/29/2021)   [place]    Feeling of Stress : Not at all  Social Connections: Moderately Integrated (10/29/2021)   Social Connection and Isolation Panel [NHANES]    Frequency of Communication with Friends and Family: More than three times a week    Frequency of Social Gatherings with Friends and Family: More than three times a week    Attends Religious Services: Never    Marine scientist or Organizations: Yes    Attends Archivist Meetings: 1 to 4 times per year    Marital Status: Married     Family History: The patient's family history includes Cancer in his mother and son; Heart disease in his father.  ROS:   Please see the history of present illness.    Review of Systems  Constitutional:  Negative for chills and fever.  HENT:  Negative for sinus pain.   Eyes:  Negative for double vision.  Respiratory:  Negative for cough.   Cardiovascular:  Negative for chest pain, palpitations, orthopnea, claudication, leg swelling and PND.  Gastrointestinal:  Negative for abdominal pain, heartburn, melena, nausea and vomiting.  Genitourinary:  Negative for dysuria.  Musculoskeletal:  Positive for joint pain and myalgias.  Skin:  Negative for rash.  Neurological:  Negative for dizziness, sensory change, loss of consciousness and weakness.    EKGs/Labs/Other Studies Reviewed:    The following studies were reviewed today: Carotid ultrasound 2011: Findings:     RIGHT CAROTID ARTERY: There is eccentric plaque in the carotid bulb  extending into the proximal internal and external carotid arteries  without high-grade stenosis.  A hypoechoic thyroid nodule is  incidentally noted.  There are normal wave forms in the  interrogated vessels with no focal aliasing on color Doppler  interrogation.     RIGHT VERTEBRAL ARTERY:  Normal flow direction and waveform.     LEFT CAROTID ARTERY: At least one 7 mm left thyroid nodule is  incidentally noted.  There is mild plaque in  the carotid bulb  extending just beyond the bifurcation without high-grade stenosis.  Normal wave forms with no focal aliasing on color Doppler  interrogation.     LEFT VERTEBRAL ARTERY:  Normal flow direction and waveform.     IMPRESSION:     1.  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 The exam does not exclude plaque  ulceration or embolization.  Continued surveillance recommended.     2.  Bilateral thyroid nodules, incompletely characterized.  Consider dedicated thyroid ultrasound for further evaluation.   EKG:  EKG is  ordered today.  The ekg ordered today demonstrates NSR with HR 91.  Recent Labs: 01/09/2022: ALT 16 02/03/2022: BUN 18; Creatinine, Ser 1.00; Hemoglobin 12.0; Platelets 215; Potassium 4.3; Sodium 136  Recent Lipid Panel    Component Value Date/Time   CHOL 140 10/17/2020 1147   TRIG 104.0 10/17/2020 1147   HDL 48.30 10/17/2020 1147   CHOLHDL 3 10/17/2020 1147   VLDL 20.8 10/17/2020 1147   LDLCALC 71 10/17/2020 1147     Risk Assessment/Calculations:       Physical Exam:    VS:  BP 128/80 (BP Location: Left Arm, Patient Position: Sitting, Cuff Size: Normal)   Pulse 91   Ht '5\' 6"'$  (1.676 m)   Wt 170 lb 1.6 oz (77.2 kg)   BMI 27.45 kg/m     Wt Readings from Last 3 Encounters:  04/17/22 170 lb 1.6 oz (77.2 kg)  03/15/22 171 lb 3.2 oz (77.7 kg)  01/23/22 168 lb 12.8 oz (76.6 kg)     GEN:  Well nourished, well developed in no acute distress HEENT: Normal NECK: No JVD; No carotid bruits CARDIAC: RRR, no murmurs, rubs, gallops RESPIRATORY:  Clear to auscultation without rales, wheezing or rhonchi  ABDOMEN: Soft, non-tender, non-distended MUSCULOSKELETAL:  No edema; No deformity  SKIN: Warm and dry NEUROLOGIC:  Alert and oriented x 3 PSYCHIATRIC:  Normal affect   ASSESSMENT:    1. Hypertension, unspecified type   2. Pure hypercholesterolemia   3. Coronary artery disease involving native coronary  artery of native heart without angina pectoris    PLAN:    In order of problems listed above:  #Hypertension: Very well controlled at home at 110-120s/70-80s.  -Continue amlodipine '5mg'$  daily -Continue spironolactone '25mg'$  daily -Had cough with lisinopril  #Coronary artery Ca: Asymptomatic and active. Discussed that this is common in men in their 71s and will continue with aggressive secondary prevention at this time due to lack of symptoms. -Continue lipitor '40mg'$  daily -Check lipids for monitoring  #HLD: -Continue atorvastatin '40mg'$  daily -Check lipids for monitoring with goal LDL <70  Medication Adjustments/Labs and Tests Ordered: Current medicines are reviewed at length with the patient today.  Concerns regarding medicines are outlined above.  Orders Placed This Encounter  Procedures   Hepatic function panel   Lipid panel   EKG 12-Lead   No orders of the defined types were placed in this encounter.   Patient Instructions  Medication Instructions:  Your physician recommends that you continue on your current medications as directed. Please refer to the Current Medication list given to you today.  *If you need a refill on your cardiac medications before your next appointment, please call your pharmacy*   Lab Work: Please return for Lab work in the next few weeks for Fasting Lipid Panel and Liver Enzymes. You may come to the...   [facility] (3rd floor) [address], [place], [place] [address]  Open: 8am-Noon and 1pm-4:30pm  Please ring the doorbell on the small table when you exit the elevator and the Lab Tech will come get you  [place] at [facility] [address] [place], [place], [place] [address] Open: 8am-1pm, then 2pm-4:30pm   [place]- Please see attached locations sheet stapled to your lab work with address and hours.    If you have labs (blood work) drawn today and your tests are completely normal, you will  receive your results only by: [place] (if you have MyChart) OR A paper copy in the  mail If you have any lab test that is abnormal or we need to change your treatment, we will call you to review the results.   Follow-Up: At [facility], you and your health needs are our priority.  As part of our continuing mission to provide you with exceptional heart care, we have created designated Provider Care Teams.  These Care Teams include your primary Cardiologist (physician) and Advanced Practice Providers (APPs -  Physician Assistants and Nurse Practitioners) who all work together to provide you with the care you need, when you need it.  We recommend signing up for the patient portal called "MyChart".  Sign up information is provided on this After Visit Summary.  MyChart is used to connect with patients for Virtual Visits (Telemedicine).  Patients are able to view lab/test results, encounter notes, upcoming appointments, etc.  Non-urgent messages can be sent to your provider as well.   To learn more about what you can do with MyChart, go to [email or web address].    Your next appointment:   1 year(s)  Provider:   Gwyndolyn Kaufman, MD      Signed, Danny Bergeron, MD  04/17/2022 2:41 PM    [place]

## 2022-04-17 ENCOUNTER — Encounter (HOSPITAL_BASED_OUTPATIENT_CLINIC_OR_DEPARTMENT_OTHER): Payer: Self-pay | Admitting: Cardiology

## 2022-04-17 ENCOUNTER — Ambulatory Visit (HOSPITAL_BASED_OUTPATIENT_CLINIC_OR_DEPARTMENT_OTHER): Payer: Medicare HMO | Admitting: Cardiology

## 2022-04-17 VITALS — BP 128/80 | HR 91 | Ht 66.0 in | Wt 170.1 lb

## 2022-04-17 DIAGNOSIS — E7800 Pure hypercholesterolemia, unspecified: Secondary | ICD-10-CM

## 2022-04-17 DIAGNOSIS — I10 Essential (primary) hypertension: Secondary | ICD-10-CM | POA: Diagnosis not present

## 2022-04-17 DIAGNOSIS — I2510 Atherosclerotic heart disease of native coronary artery without angina pectoris: Secondary | ICD-10-CM | POA: Diagnosis not present

## 2022-04-17 NOTE — Patient Instructions (Signed)
Medication Instructions:  Your physician recommends that you continue on your current medications as directed. Please refer to the Current Medication list given to you today.  *If you need a refill on your cardiac medications before your next appointment, please call your pharmacy*   Lab Work: Please return for Lab work in the next few weeks for Fasting Lipid Panel and Liver Enzymes. You may come to the...   [facility] (3rd floor) [address], [place], [place] [address]  Open: 8am-Noon and 1pm-4:30pm  Please ring the doorbell on the small table when you exit the elevator and the Lab Tech will come get you  [place] at [facility] [address] [place], [place], [place] [address] Open: 8am-1pm, then 2pm-4:30pm   [place]- Please see attached locations sheet stapled to your lab work with address and hours.    If you have labs (blood work) drawn today and your tests are completely normal, you will receive your results only by: [place] (if you have MyChart) OR A paper copy in the mail If you have any lab test that is abnormal or we need to change your treatment, we will call you to review the results.   Follow-Up: At [facility], you and your health needs are our priority.  As part of our continuing mission to provide you with exceptional heart care, we have created designated Provider Care Teams.  These Care Teams include your primary Cardiologist (physician) and Advanced Practice Providers (APPs -  Physician Assistants and Nurse Practitioners) who all work together to provide you with the care you need, when you need it.  We recommend signing up for the patient portal called "MyChart".  Sign up information is provided on this After Visit Summary.  MyChart is used to connect with patients for Virtual Visits (Telemedicine).  Patients are able to view lab/test results, encounter notes, upcoming appointments, etc.  Non-urgent  messages can be sent to your provider as well.   To learn more about what you can do with MyChart, go to [email or web address].    Your next appointment:   1 year(s)  Provider:   Gwyndolyn Kaufman, MD

## 2022-04-18 ENCOUNTER — Other Ambulatory Visit: Payer: Self-pay | Admitting: Physician Assistant

## 2022-04-22 DIAGNOSIS — M19012 Primary osteoarthritis, left shoulder: Secondary | ICD-10-CM | POA: Diagnosis not present

## 2022-05-14 DIAGNOSIS — L821 Other seborrheic keratosis: Secondary | ICD-10-CM | POA: Diagnosis not present

## 2022-05-14 DIAGNOSIS — L905 Scar conditions and fibrosis of skin: Secondary | ICD-10-CM | POA: Diagnosis not present

## 2022-05-14 DIAGNOSIS — L570 Actinic keratosis: Secondary | ICD-10-CM | POA: Diagnosis not present

## 2022-05-14 DIAGNOSIS — D225 Melanocytic nevi of trunk: Secondary | ICD-10-CM | POA: Diagnosis not present

## 2022-05-14 DIAGNOSIS — D1801 Hemangioma of skin and subcutaneous tissue: Secondary | ICD-10-CM | POA: Diagnosis not present

## 2022-05-14 DIAGNOSIS — D485 Neoplasm of uncertain behavior of skin: Secondary | ICD-10-CM | POA: Diagnosis not present

## 2022-05-14 DIAGNOSIS — C44219 Basal cell carcinoma of skin of left ear and external auricular canal: Secondary | ICD-10-CM | POA: Diagnosis not present

## 2022-05-14 DIAGNOSIS — Z85828 Personal history of other malignant neoplasm of skin: Secondary | ICD-10-CM | POA: Diagnosis not present

## 2022-05-20 ENCOUNTER — Ambulatory Visit (INDEPENDENT_AMBULATORY_CARE_PROVIDER_SITE_OTHER): Payer: Medicare HMO | Admitting: Physician Assistant

## 2022-05-20 ENCOUNTER — Encounter: Payer: Self-pay | Admitting: Physician Assistant

## 2022-05-20 ENCOUNTER — Telehealth: Payer: Self-pay | Admitting: Physician Assistant

## 2022-05-20 VITALS — BP 132/78 | HR 90 | Temp 97.5°F | Ht 66.0 in | Wt 168.4 lb

## 2022-05-20 DIAGNOSIS — E119 Type 2 diabetes mellitus without complications: Secondary | ICD-10-CM | POA: Diagnosis not present

## 2022-05-20 DIAGNOSIS — N6342 Unspecified lump in left breast, subareolar: Secondary | ICD-10-CM | POA: Diagnosis not present

## 2022-05-20 DIAGNOSIS — J411 Mucopurulent chronic bronchitis: Secondary | ICD-10-CM | POA: Insufficient documentation

## 2022-05-20 DIAGNOSIS — E789 Disorder of lipoprotein metabolism, unspecified: Secondary | ICD-10-CM | POA: Insufficient documentation

## 2022-05-20 LAB — POCT GLYCOSYLATED HEMOGLOBIN (HGB A1C): Hemoglobin A1C: 6.2 % — AB (ref 4.0–5.6)

## 2022-05-20 MED ORDER — CEPHALEXIN 500 MG PO CAPS: 500.0000 mg | ORAL_CAPSULE | Freq: Three times a day (TID) | ORAL | 0 refills | Status: AC

## 2022-05-20 NOTE — Assessment & Plan Note (Signed)
Lab Results  Component Value Date   HGBA1C 6.2 (A) 05/20/2022   He will continue on metformin 1000 mg twice daily. He will continue to work on good dietary habits. He does not check his sugars at home. He is not on insulin.

## 2022-05-20 NOTE — Patient Instructions (Addendum)
Take the cephalexin as directed. Warm compresses to the area. I have placed an urgent referral to general surgery - let me know when your appointment is scheduled.  ER if acutely worse pain, redness, fever, etc.  Diabetes is well-controlled! Great work.

## 2022-05-20 NOTE — Progress Notes (Signed)
Subjective:    Patient ID: Danny Hunter, male    DOB: 04-11-1940, 82 y.o.   MRN: [id number]  Chief Complaint  Patient presents with   Mastitis    Pt scheduled appt after seeing dermatology for lump on left side of breast; Mastitis per specialist and advised to see PCP; pt also requesting to have ear loked at where he had scraped for cancer at Dermatology;     HPI Patient is in today for concern about left breast. About three weeks ago started with mass and pain. No discharge. No fever or chills. No redness. No hx breast cancer in the family.   Derm appt - already had for recheck of cancer left ear, they told him they thought mastitis, but told him to come here for treatment.   Still doing well with walking daily, taking medication as directed. No other concerns.  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;  Service: Orthopedics;  Laterality: Right;  right total shoulder arthroplasty    Family History  Problem Relation Age of Onset   Cancer Mother    Heart disease Father    Cancer Son     Social History   Tobacco Use   Smoking status: Never   Smokeless tobacco: Never  Vaping Use   Vaping Use: Never used  Substance Use Topics   Alcohol use: Yes    Comment: 1  drink a month   Drug use: No     Allergies  Allergen Reactions   Penicillin G Other (See Comments)    Childhood Reaction   Tolerated Cephalosporin Date: 09/05/21.    Review of Systems NEGATIVE UNLESS OTHERWISE INDICATED IN HPI      Objective:     BP 132/78 (BP Location: Left Arm)   Pulse 90   Temp (!) 97.5 F (36.4 C) (Temporal)   Ht  (1.676 m)   Wt 168 lb 6.4 oz (76.4 kg)   SpO2 96%   BMI 27.18 kg/m   Wt Readings from Last 3 Encounters:  05/20/22 168 lb 6.4 oz (76.4 kg)  04/17/22 170 lb 1.6 oz (77.2 kg)  03/15/22 171 lb 3.2 oz (77.7 kg)    BP Readings from Last 3 Encounters:  05/20/22 132/78  04/17/22 128/80  03/15/22 128/80     Physical Exam Vitals and nursing note reviewed.  Constitutional:      Appearance: Normal appearance.  Eyes:     Extraocular Movements: Extraocular movements intact.     Conjunctiva/sclera: Conjunctivae normal.     Pupils: Pupils are equal, round, and reactive to light.  Cardiovascular:  Rate and Rhythm: Normal rate and regular rhythm.  Pulmonary:     Effort: Pulmonary effort is normal.     Breath sounds: Normal breath sounds.  Chest:    Neurological:     General: No focal deficit present.     Mental Status: He is alert and oriented to person, place, and time.  Psychiatric:        Mood and Affect: Mood normal.        Behavior: Behavior normal.        Assessment & Plan:  Subareolar mass of left breast -     Ambulatory referral to General Surgery  Controlled type 2 diabetes mellitus without complication, without long-term current use of insulin Assessment & Plan: Lab Results  Component Value Date   HGBA1C 6.2 (A) 05/20/2022   He will continue on metformin 1000 mg twice daily. He will continue to work on good dietary habits. He does not check his sugars at home. He is not on insulin.   Orders: -     POCT glycosylated hemoglobin (Hb A1C)  Other orders -     Cephalexin; Take 1 capsule (500 mg total) by mouth  3 (three) times daily for 7 days.  Dispense: 21 capsule; Refill: 0    New painful breast mass in 82 yo male. ?mastitis vs other. Will start on cephalexin, warm compresses; urgent referral to gen surg. ED if warranted.     Return in about 6 months (around 11/19/2022) for recheck, fasting labs .    Tiena Manansala M Dreydon Cardenas, PA-C

## 2022-05-20 NOTE — Telephone Encounter (Signed)
Patient states Danny Hunter told Patient the soonest Patient can be scheduled is in June 2024.  Patient requests to be advised on if he should schedule the June appointment.

## 2022-05-21 NOTE — Telephone Encounter (Signed)
Returned pt call and advised the appt for June for Gastroenterology is fine, first available would be ok. Also advised he would need to contact [facility] and provided that number to call and schedule appt in their office in regards to urgent referral. Pt was not aware of MyChart letter sent in regards to scheduling this appt.

## 2022-06-06 DIAGNOSIS — N6342 Unspecified lump in left breast, subareolar: Secondary | ICD-10-CM | POA: Diagnosis not present

## 2022-06-10 ENCOUNTER — Other Ambulatory Visit: Payer: Self-pay | Admitting: General Surgery

## 2022-06-10 DIAGNOSIS — N6342 Unspecified lump in left breast, subareolar: Secondary | ICD-10-CM

## 2022-06-17 ENCOUNTER — Ambulatory Visit
Admission: RE | Admit: 2022-06-17 | Discharge: 2022-06-17 | Disposition: A | Discharge status: Home / Self Care | Payer: Medicare HMO | Source: Ambulatory Visit | Attending: General Surgery | Admitting: General Surgery

## 2022-06-17 DIAGNOSIS — N62 Hypertrophy of breast: Secondary | ICD-10-CM | POA: Diagnosis not present

## 2022-06-17 DIAGNOSIS — N6342 Unspecified lump in left breast, subareolar: Secondary | ICD-10-CM

## 2022-06-28 ENCOUNTER — Other Ambulatory Visit: Payer: Self-pay | Admitting: Physician Assistant

## 2022-07-16 NOTE — Progress Notes (Unsigned)
07/17/2022 Danny Hunter [id number] November 27, 1940  Referring provider: Allwardt, Crist Infante, PA-C Primary GI doctor: Dr. Lavon Paganini ( Dr. Arlyce Dice)  ASSESSMENT AND PLAN:  Anemia, unspecified type 12/06 HGB 13 02/03/2022  HGB 12.0 MCV 91.9 Platelets 215 No fatigue, no SOB, no CP No further bleeding Will check CBC, iron/ferritin, CMET  Chronic idiopathic constipation Hard stools, no associated symptoms, no hematochezia Could be from medications/age - Increase fiber/ water intake, decrease caffeine, increase activity level. -Will add on Miralax daily and Benefiber - follow up 3 months  Diverticular hemorrhage CTA showed small GI bleed at splenic flexure , no masses, no lymphadenopathy 2016 colonoscopy 5 mm sessile polyp descending colon and mild diverticulosis ascending transverse descending colon  Discussed with patient, most likely this represents diverticular hemorrhage, no associated symptoms No need for endoscopic evaluation at this time unless persistent anemia or re bleeding occurs, could consider Will add on fiber, will check iron, and schedule 3 month FU    Patient Care Team: Allwardt, Crist Infante, PA-C as PCP - General (Physician Assistant) Meriam Sprague, MD as PCP - Cardiology (Cardiology) Pa, [facility] (Ophthalmology)  HISTORY OF PRESENT ILLNESS: 82 y.o. male with a past medical history of hyperlipidemia, OSA, hypertension, diabetes, BPH and others listed below presents for hospital follow up for GI bleed now with constipation.   10/24/2014 colonoscopy with Dr. Arlyce Dice for personal history of adenomatous polyps excellent bowel prep with Suprep 5 mm sessile polyp descending colon, mild diverticulosis ascending transverse descending colon and internal hemorrhoids no repeat colonoscopy secondary to age  Patient admitted 01/09/2022 through 01/11/22 for black tarry and maroon-colored stools, CTA showed small GI bleed at splenic flexure, hemoglobin  stable, no endoscopic procedure performed, transferred to drop regimen at that time bleeding had stopped.   Decided to monitor and conservative measures.  Patient had no rebleeding, stable hemoglobin and discharged home.  Presents for follow up.  He is having constipation, having hard round balls, worse last 2-3 months. Denies AB pain. Can have some rectal pain with BM but denies any further hematochezia or melena.  Denies GERD, nausea, vomiting, dysphagia.  Bm's were normal prior to this, every other day and not hard. He took miralax once and it gave him diarrhea.  He denies any new medications or changes in dosage. He does not take any vitamins, no pain medications.  He has intentionally lost weight, going to [facility].  [facility] in [facility], wife with dementia/parkinson's. He has GF.   He denies blood thinner use.  He denies NSAID use.  He denies ETOH use.   He denies tobacco use.  He denies drug use.    He  reports that he has never smoked. He has never used smokeless tobacco. He reports current alcohol use. He reports that he does not use drugs.  RELEVANT LABS AND IMAGING: CBC    Component Value Date/Time   WBC 9.0 02/03/2022 1020   RBC 4.08 (L) 02/03/2022 1020   HGB 12.0 (L) 02/03/2022 1020   HCT 37.5 (L) 02/03/2022 1020   PLT 215 02/03/2022 1020   MCV 91.9 02/03/2022 1020   MCH 29.4 02/03/2022 1020   MCHC 32.0 02/03/2022 1020   RDW 13.6 02/03/2022 1020   LYMPHSABS 0.8 01/23/2022 0841   MONOABS 1.0 01/23/2022 0841   EOSABS 0.2 01/23/2022 0841   BASOSABS 0.1 01/23/2022 0841   Recent Labs    01/09/22 1418 01/09/22 1955 01/10/22 0311 01/10/22 0811 01/10/22 1146 01/10/22 2016 01/11/22  0309 01/11/22 0838 01/23/22 0841 02/03/22 1020  HGB 13.1 12.8* 12.5* 12.6* 11.9* 11.2* 11.8* 11.5* 12.0* 12.0*    CMP     Component Value Date/Time   NA 136 02/03/2022 1245   NA 137 06/21/2019 0000   K 4.3 02/03/2022 1245   CL 104 02/03/2022 1245   CO2 24 02/03/2022  1245   GLUCOSE 109 (H) 02/03/2022 1245   BUN 18 02/03/2022 1245   BUN 21 06/21/2019 0000   CREATININE 1.00 02/03/2022 1245   CALCIUM 10.2 02/03/2022 1245   PROT 6.9 01/09/2022 0851   ALBUMIN 4.4 01/09/2022 0851   AST 16 01/09/2022 0851   ALT 16 01/09/2022 0851   ALKPHOS 60 01/09/2022 0851   BILITOT 1.0 01/09/2022 0851   GFRNONAA >60 02/03/2022 1245   GFRAA >90 10/23/2011 0850      Latest Ref Rng & Units 01/09/2022    8:51 AM 10/17/2020   11:47 AM 12/14/2019    9:57 AM  Hepatic Function  Total Protein 6.5 - 8.1 g/dL 6.9  7.1  6.6   Albumin 3.5 - 5.0 g/dL 4.4  4.4  4.5   AST 15 - 41 U/L 16  18  16    ALT 0 - 44 U/L 16  17  15    Alk Phosphatase 38 - 126 U/L 60  61  53   Total Bilirubin 0.3 - 1.2 mg/dL 1.0  0.9  0.9       Current Medications:   Current Outpatient Medications (Endocrine & Metabolic):    metFORMIN (GLUCOPHAGE) 500 MG tablet, Take 2 tablets (1,000 mg total) by mouth 2 (two) times daily with a meal.  Current Outpatient Medications (Cardiovascular):    amLODipine (NORVASC) 5 MG tablet, Take 1 tablet by mouth once daily   atorvastatin (LIPITOR) 40 MG tablet, Take 1 tablet (40 mg total) by mouth daily.   sildenafil (REVATIO) 20 MG tablet, Take 20 mg by mouth daily as needed.   spironolactone (ALDACTONE) 25 MG tablet, Take 1 tablet by mouth once daily     Current Outpatient Medications (Other):    finasteride (PROSCAR) 5 MG tablet, Take 1 tablet (5 mg total) by mouth daily.   silodosin (RAPAFLO) 8 MG CAPS capsule, Take 1 capsule (8 mg total) by mouth daily.  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     Surgical History:  He  has a past surgical history that includes Total shoulder arthroplasty (10/22/2011); Cataract extraction (2019); Skin cancer excision (Right); and Total hip arthroplasty (Left, 09/04/2021). Family History:  His family history includes Cancer in his mother; Dementia in his brother; Diabetes in his brother, father, and paternal aunt; Heart disease in his father; Hyperlipidemia in his brother; Hypertension in his brother; Kidney cancer in his son.  REVIEW OF SYSTEMS  : All other systems reviewed and negative except where noted in the History of Present Illness.  PHYSICAL EXAM: BP  118/70   Pulse 84   Ht 5' 4.75" (1.645 m) Comment: height measured without shoes  Wt 171 lb 4 oz (77.7 kg)   BMI 28.72 kg/m  General Appearance: Well nourished, in no apparent distress. Head:   Normocephalic and atraumatic. Eyes:  sclerae anicteric,conjunctive pink  Respiratory: Respiratory effort normal, BS equal bilaterally without rales, rhonchi, wheezing. Cardio: RRR with no MRGs. Peripheral pulses intact.  Abdomen: Soft,  Non-distended ,active bowel sounds. No tenderness . Without guarding and Without rebound. No masses. Rectal: Not evaluated Musculoskeletal: Full ROM, Normal gait. Without edema. Skin:  Dry and intact without significant lesions or rashes Neuro: Alert and  oriented x4;  No focal deficits. Psych:  Cooperative. Normal mood and affect.    Doree Albee, PA-C 11:34 AM

## 2022-07-17 ENCOUNTER — Ambulatory Visit: Payer: Medicare HMO | Admitting: Physician Assistant

## 2022-07-17 ENCOUNTER — Encounter: Payer: Self-pay | Admitting: Physician Assistant

## 2022-07-17 ENCOUNTER — Other Ambulatory Visit (INDEPENDENT_AMBULATORY_CARE_PROVIDER_SITE_OTHER): Payer: Medicare HMO

## 2022-07-17 VITALS — BP 118/70 | HR 84 | Ht 64.75 in | Wt 171.2 lb

## 2022-07-17 DIAGNOSIS — K5731 Diverticulosis of large intestine without perforation or abscess with bleeding: Secondary | ICD-10-CM

## 2022-07-17 DIAGNOSIS — D649 Anemia, unspecified: Secondary | ICD-10-CM

## 2022-07-17 DIAGNOSIS — K5904 Chronic idiopathic constipation: Secondary | ICD-10-CM

## 2022-07-17 LAB — IBC + FERRITIN
Ferritin: 8.7 ng/mL — ABNORMAL LOW (ref 22.0–322.0)
Iron: 49 ug/dL (ref 42–165)
Saturation Ratios: 12.2 % — ABNORMAL LOW (ref 20.0–50.0)
TIBC: 400.4 ug/dL (ref 250.0–450.0)
Transferrin: 286 mg/dL (ref 212.0–360.0)

## 2022-07-17 LAB — COMPREHENSIVE METABOLIC PANEL
ALT: 15 U/L (ref 0–53)
AST: 18 U/L (ref 0–37)
Albumin: 4.3 g/dL (ref 3.5–5.2)
Alkaline Phosphatase: 60 U/L (ref 39–117)
BUN: 26 mg/dL — ABNORMAL HIGH (ref 6–23)
CO2: 28 mEq/L (ref 19–32)
Calcium: 10.3 mg/dL (ref 8.4–10.5)
Chloride: 102 mEq/L (ref 96–112)
Creatinine, Ser: 1.1 mg/dL (ref 0.40–1.50)
GFR: 62.22 mL/min (ref >60.00)
Glucose, Bld: 125 mg/dL — ABNORMAL HIGH (ref 70–99)
Potassium: 4.7 mEq/L (ref 3.5–5.1)
Sodium: 136 mEq/L (ref 135–145)
Total Bilirubin: 0.6 mg/dL (ref 0.2–1.2)
Total Protein: 7.1 g/dL (ref 6.0–8.3)

## 2022-07-17 LAB — CBC WITH DIFFERENTIAL/PLATELET
Basophils Absolute: 0 10*3/uL (ref 0.0–0.1)
Basophils Relative: 0.4 % (ref 0.0–3.0)
Eosinophils Absolute: 0.1 10*3/uL (ref 0.0–0.7)
Eosinophils Relative: 0.8 % (ref 0.0–5.0)
HCT: 43.4 % (ref 39.0–52.0)
Hemoglobin: 13.7 g/dL (ref 13.0–17.0)
Lymphocytes Relative: 11.3 % — ABNORMAL LOW (ref 12.0–46.0)
Lymphs Abs: 0.8 10*3/uL (ref 0.7–4.0)
MCHC: 31.5 g/dL (ref 30.0–36.0)
MCV: 87 fl (ref 78.0–100.0)
Monocytes Absolute: 0.7 10*3/uL (ref 0.1–1.0)
Monocytes Relative: 9.9 % (ref 3.0–12.0)
Neutro Abs: 5.2 10*3/uL (ref 1.4–7.7)
Neutrophils Relative %: 77.6 % — ABNORMAL HIGH (ref 43.0–77.0)
Platelets: 246 10*3/uL (ref 150.0–400.0)
RBC: 4.98 Mil/uL (ref 4.22–5.81)
RDW: 18.3 % — ABNORMAL HIGH (ref 11.5–15.5)
WBC: 6.7 10*3/uL (ref 4.0–10.5)

## 2022-07-17 NOTE — Patient Instructions (Addendum)
Your provider has requested that you go to the basement level for lab work before leaving today. Press "B" on the elevator. The lab is located at the first door on the left as you exit the elevator.  Miralax is an osmotic laxative.  It only brings more water into the stool.  This is safe to take daily.  Can take up to 17 gram of miralax twice a day.  Mix with juice or coffee.  Start 1 capful at night for 3-4 days and reassess your response in 3-4 days.  You can increase and decrease the dose based on your response.  Remember, it can take up to 3-4 days to take effect OR for the effects to wear off.   I often pair this with benefiber in the morning to help assure the stool is not too loose.   Recommend starting on a fiber supplement, can try metamucil first but if this causes gas/bloating switch to benefiber or citracel, these do not cause gas.  Take with fiber with with a full 8 oz glass of water once a day. This can take 1 month to start helping, so try for at least one month.  Recommend increasing water and physical activity.   - Drink at least 64-80 ounces of water/liquid per day. - Establish a time to try to move your bowels every day.  For many people, this is after a cup of coffee or after a meal such as breakfast. - Sit all of the way back on the toilet keeping your back fairly straight and while sitting up, try to rest the tops of your forearms on your upper thighs.   - Raising your feet with a step stool/squatty potty can be helpful to improve the angle that allows your stool to pass through the rectum. - Relax the rectum feeling it bulge toward the toilet water.  If you feel your rectum raising toward your body, you are contracting rather than relaxing. - Breathe in and slowly exhale. "Belly breath" by expanding your belly towards your belly button. Keep belly expanded as you gently direct pressure down and back to the anus.  A low pitched GRRR sound can assist with increasing  intra-abdominal pressure.  (Can also trying to blow on a pinwheel and make it move, this helps with the same belly breathing) - Repeat 3-4 times. If unsuccessful, contract the pelvic floor to restore normal tone and get off the toilet.  Avoid excessive straining. - To reduce excessive wiping by teaching your anus to normally contract, place hands on outer aspect of knees and resist knee movement outward.  Hold 5-10 second then place hands just inside of knees and resist inward movement of knees.  Hold 5 seconds.  Repeat a few times each way.  Go to the ER if unable to pass gas, severe AB pain, unable to hold down food, any shortness of breath of chest pain.  Diverticulosis Diverticulosis is a condition that develops when small pouches (diverticula) form in the wall of the large intestine (colon). The colon is where water is absorbed and stool (feces) is formed. The pouches form when the inside layer of the colon pushes through weak spots in the outer layers of the colon. You may have a few pouches or many of them. The pouches usually do not cause problems unless they become inflamed or infected. When this happens, the condition is called diverticulitis- this is left lower quadrant pain, diarrhea, fever, chills, nausea or vomiting.  If this  occurs please call the office or go to the hospital. Sometimes these patches without inflammation can also have painless bleeding associated with them, if this happens please call the office or go to the hospital. Preventing constipation and increasing fiber can help reduce diverticula and prevent complications. Even if you feel you have a high-fiber diet, suggest getting on Benefiber or Cirtracel 2 times daily.  Due to recent changes in healthcare laws, you may see the results of your imaging and laboratory studies on MyChart before your provider has had a chance to review them.  We understand that in some cases there may be results that are confusing or concerning  to you. Not all laboratory results come back in the same time frame and the provider may be waiting for multiple results in order to interpret others.  Please give [place] 48 hours in order for your provider to thoroughly review all the results before contacting the office for clarification of your results.    I appreciate the  opportunity to care for you  Thank You   [facility]

## 2022-07-20 IMAGING — DX DG HIP (WITH OR WITHOUT PELVIS) 2-3V*L*
3 series · 3 of 3 positions shown · non-contrast
Comparison: None

CLINICAL DATA: Left hip pain for 3 months.  No known injury.

EXAM:
DG HIP (WITH OR WITHOUT PELVIS) 2-3V LEFT

[pelvis ap]
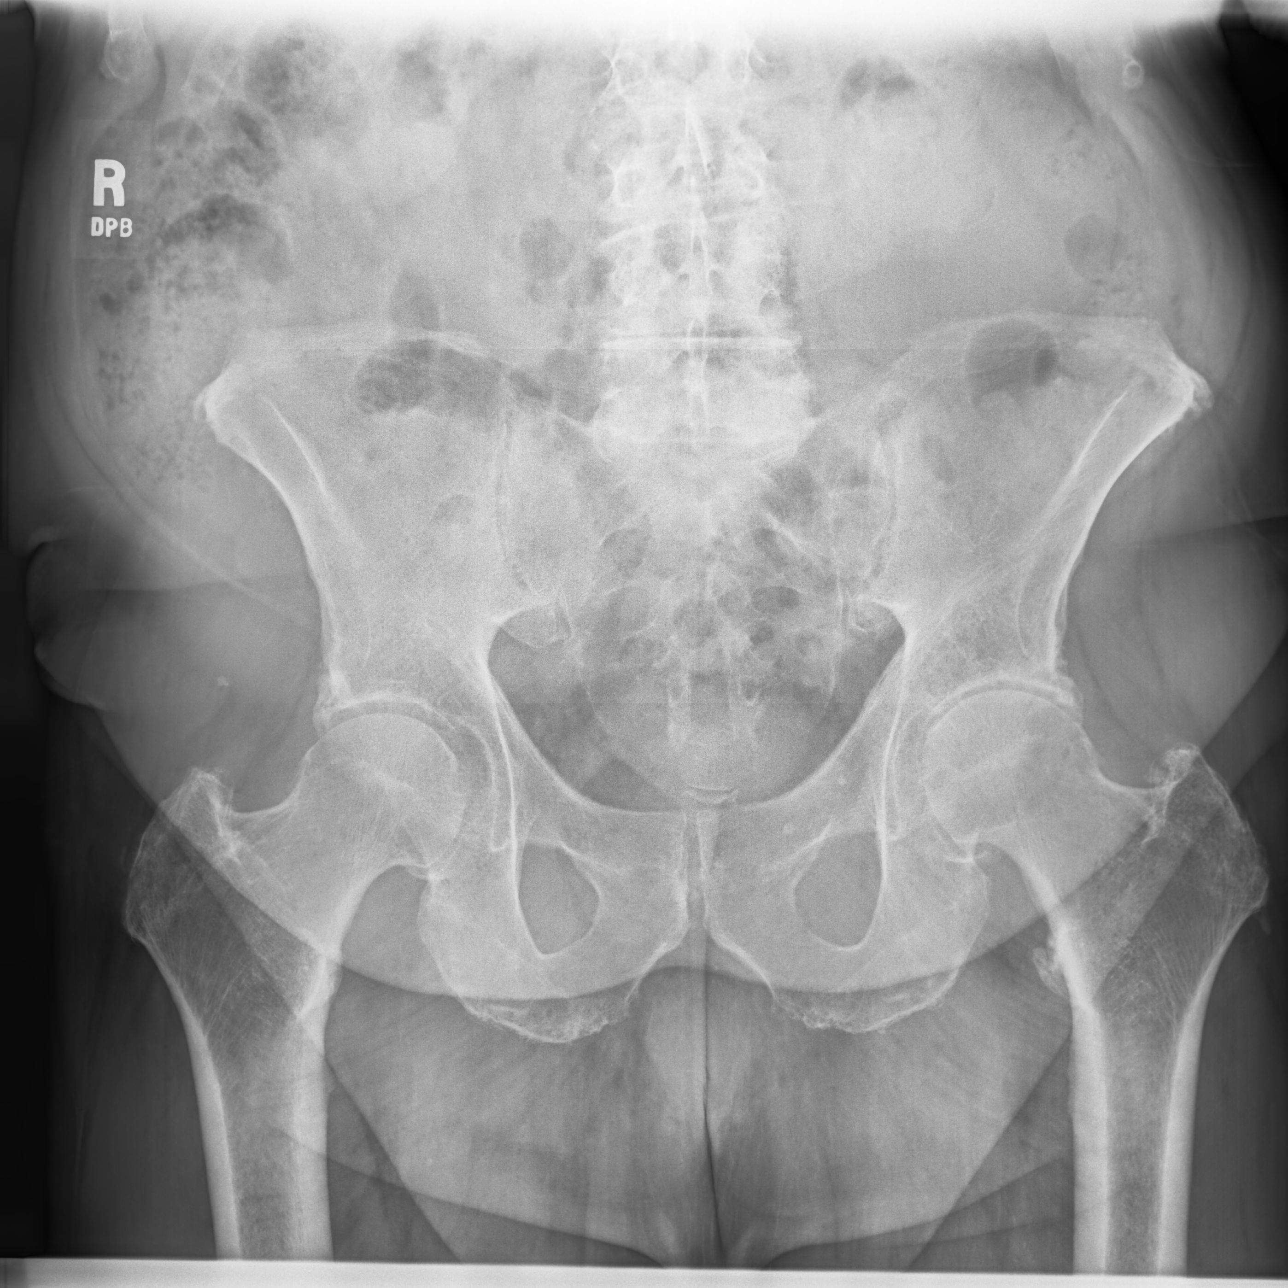

[hip joint ap]
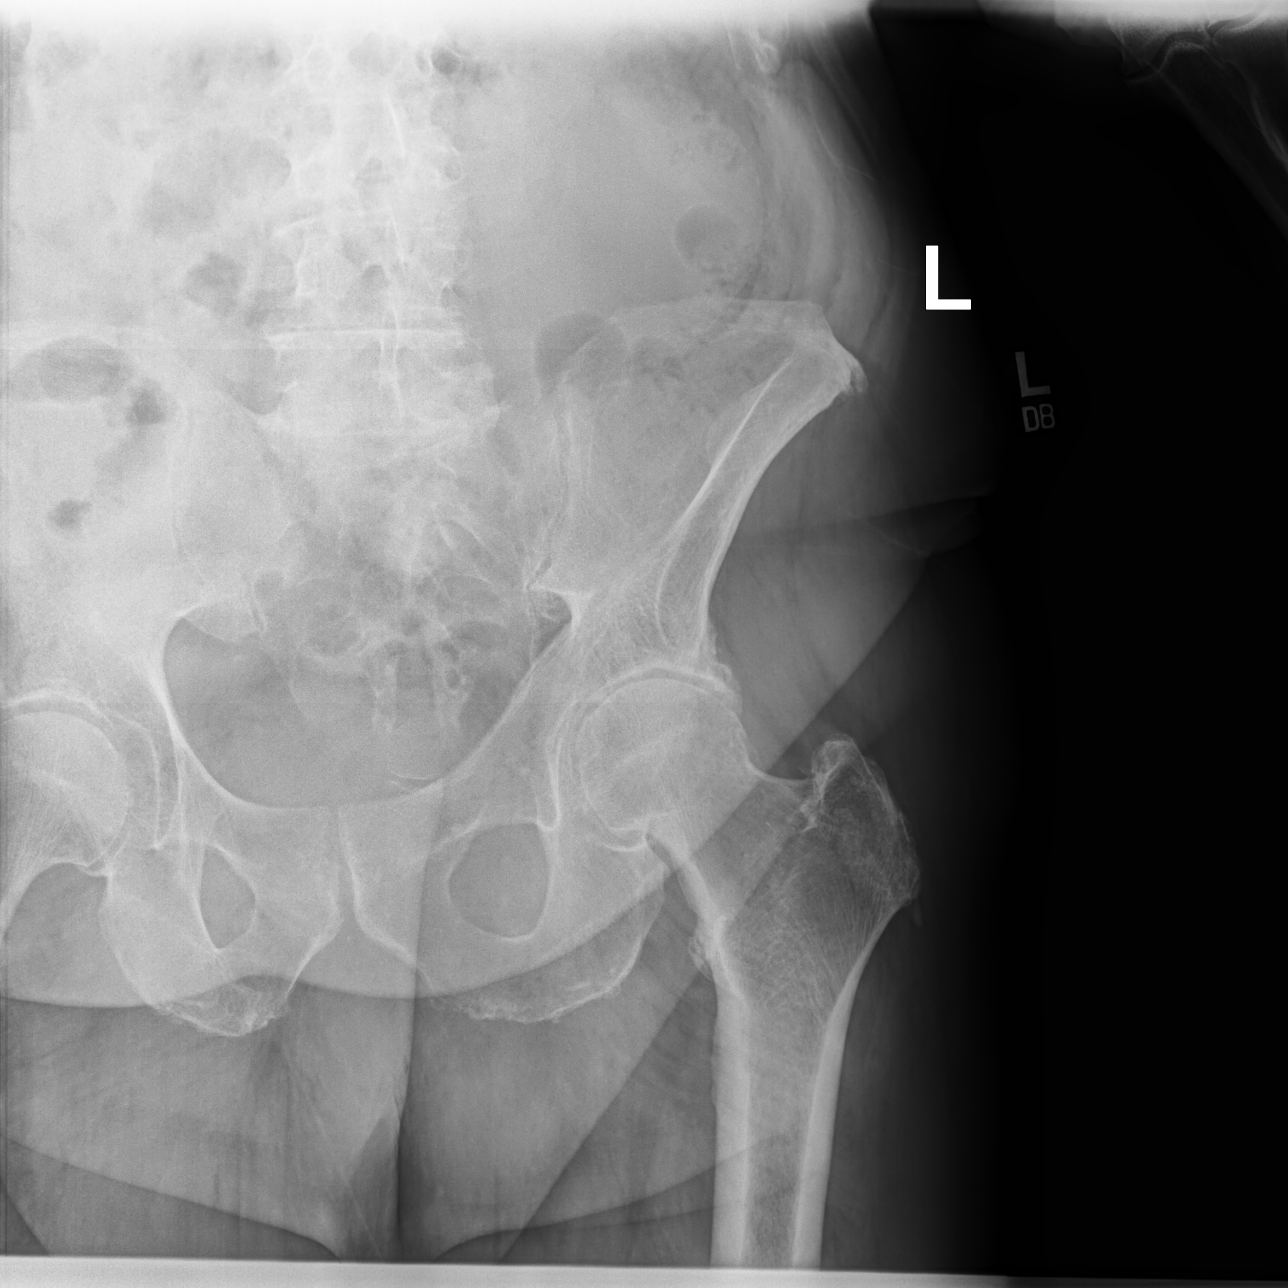

[hip joint (frog view)]
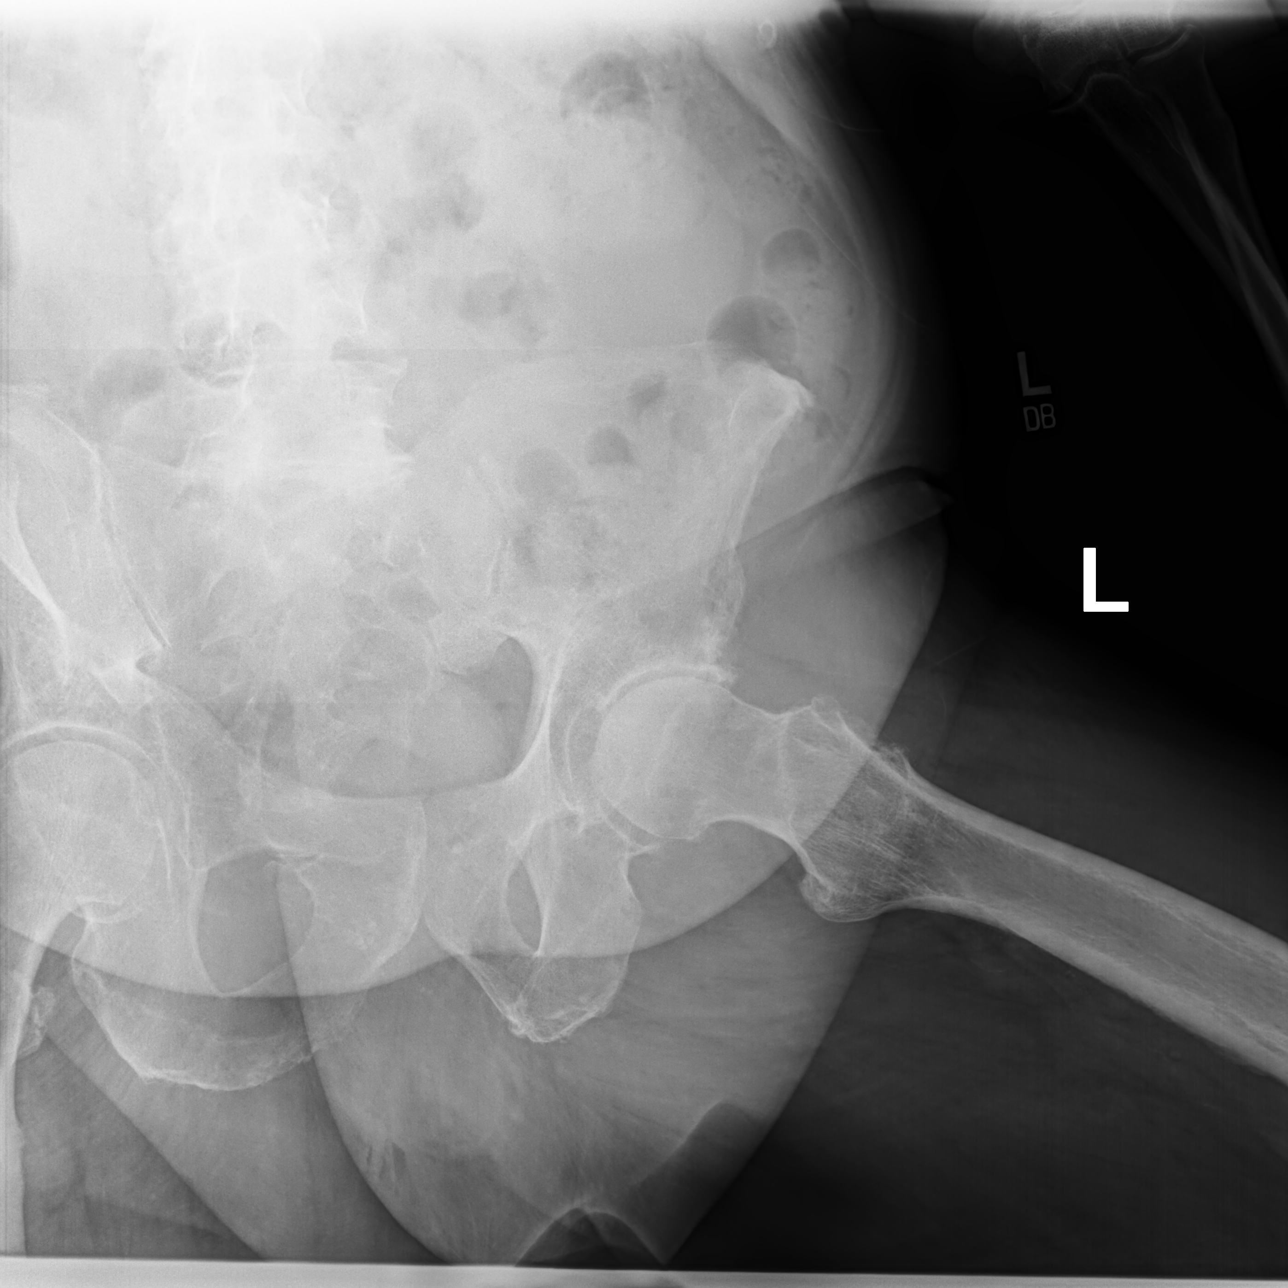

[3 of 3 positions shown; findings below may reference images not displayed]

FINDINGS: Degenerative changes in the left hip with mild loss of joint space
relative to the right. No other abnormalities.
IMPRESSION: Degenerative changes in the left hip with mild loss of joint space
relative to the right. No other abnormalities.

## 2022-08-07 ENCOUNTER — Other Ambulatory Visit: Payer: Self-pay | Admitting: Physician Assistant

## 2022-09-02 ENCOUNTER — Other Ambulatory Visit: Payer: Self-pay | Admitting: Physician Assistant

## 2022-09-10 ENCOUNTER — Telehealth: Payer: Self-pay

## 2022-09-10 NOTE — Telephone Encounter (Signed)
Received a fax from [place] at [place] of patient's POS asking for provider to please to review, sign and fax ASAP. Given to provider who reviewed and signed. Faxed back to [place] at [place].

## 2022-09-19 ENCOUNTER — Ambulatory Visit (INDEPENDENT_AMBULATORY_CARE_PROVIDER_SITE_OTHER): Payer: Medicare HMO | Admitting: Physician Assistant

## 2022-09-19 ENCOUNTER — Encounter: Payer: Self-pay | Admitting: Physician Assistant

## 2022-09-19 VITALS — BP 128/80 | HR 80 | Temp 97.7°F | Ht 64.75 in | Wt 170.0 lb

## 2022-09-19 DIAGNOSIS — J069 Acute upper respiratory infection, unspecified: Secondary | ICD-10-CM | POA: Diagnosis not present

## 2022-09-19 LAB — POC COVID19 BINAXNOW: SARS Coronavirus 2 Ag: NEGATIVE

## 2022-09-19 MED ORDER — GUAIFENESIN-CODEINE 100-10 MG/5ML PO SYRP: 5.0000 mL | ORAL_SOLUTION | Freq: Three times a day (TID) | ORAL | 0 refills | Status: DC | PRN

## 2022-09-19 NOTE — Progress Notes (Signed)
Subjective:    Patient ID: Danny Hunter, male    DOB: 03/25/1940, 82 y.o.   MRN: [id number]  Chief Complaint  Patient presents with   Cough    Pt in office for severe cough and yellow phlegm, Girl friend has same symptoms but no Covid and no pneumonia; cough keeping patient awake at night; pt not tested for Covid;     Cough   Patient is in today for cough and congestion x 1 week. No fever or chills. No CP or SOB. No body aches. Lives in assisted living. GF has same symptoms x 3 weeks now.   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   Tubular adenoma of colon    2011    Past Surgical History:  Procedure Laterality Date   CATARACT EXTRACTION  2019   SKIN CANCER EXCISION Right    ear   TOTAL HIP ARTHROPLASTY Left 09/04/2021   Procedure: TOTAL HIP ARTHROPLASTY ANTERIOR APPROACH;  Surgeon: Durene Romans, MD;  Location: [facility];  Service: Orthopedics;  Laterality: Left;   TOTAL SHOULDER ARTHROPLASTY  10/22/2011   Procedure: TOTAL SHOULDER ARTHROPLASTY;  Surgeon: Mable Paris, MD;  Location: [facility] OR;  Service: Orthopedics;  Laterality: Right;  right total shoulder arthroplasty    Family History  Problem Relation Age of Onset   Cancer Mother        type unknown   Heart disease Father    Diabetes Father    Kidney cancer Son    Diabetes Paternal Aunt    Dementia Brother    Hypertension Brother    Hyperlipidemia Brother    Diabetes Brother     Social History   Tobacco Use   Smoking status: Never   Smokeless tobacco: Never  Vaping Use   Vaping status: Never Used  Substance Use Topics   Alcohol use: Yes    Comment: 1 drink a month   Drug use: No     Allergies  Allergen Reactions   Penicillin G Other (See Comments)     Childhood Reaction   Tolerated Cephalosporin Date: 09/05/21.    Review of Systems  Respiratory:  Positive for cough.    NEGATIVE UNLESS OTHERWISE INDICATED IN HPI      Objective:     BP 128/80 (BP Location: Left Arm)   Pulse 80   Temp 97.7 F (36.5 C) (Temporal)   Ht 5' 4.75" (1.645 m)   Wt 170 lb (77.1 kg)   SpO2 96%   BMI 28.51 kg/m   Wt Readings from Last 3 Encounters:  09/19/22 170 lb (77.1 kg)  07/17/22 171 lb 4 oz (77.7 kg)  05/20/22 168 lb 6.4 oz (76.4 kg)    BP Readings from Last 3 Encounters:  09/19/22 128/80  07/17/22 118/70  05/20/22 132/78     Physical Exam Vitals and nursing note reviewed.  Constitutional:      Appearance: Normal appearance.  Cardiovascular:     Rate and Rhythm: Normal rate and regular rhythm.     Pulses: Normal pulses.     Heart sounds: No murmur heard. Pulmonary:     Effort:  Pulmonary effort is normal. No respiratory distress.     Breath sounds: Normal breath sounds. No stridor. No wheezing or rhonchi.  Skin:    Findings: No rash.  Neurological:     General: No focal deficit present.     Mental Status: He is alert and oriented to person, place, and time.  Psychiatric:        Mood and Affect: Mood normal.        Assessment & Plan:  Acute upper respiratory infection -     POC COVID-19 BinaxNow -     guaiFENesin-Codeine; Take 5 mLs by mouth 3 (three) times daily as needed for cough.  Dispense: 120 mL; Refill: 0  -Overall well appearing on exam; lungs CTAB -COVID POC negative -Reassured likely viral -Treat with fluids, rest, Tylenol as needed, Mucinex -Cheratussin syrup as directed. Tolerates well. Knows not to drive with this med.      Return if symptoms worsen or fail to improve.     M , PA-C

## 2022-09-23 ENCOUNTER — Other Ambulatory Visit: Payer: Self-pay | Admitting: Physician Assistant

## 2022-11-04 ENCOUNTER — Other Ambulatory Visit: Payer: Self-pay | Admitting: Physician Assistant

## 2022-11-10 ENCOUNTER — Other Ambulatory Visit: Payer: Self-pay | Admitting: Physician Assistant

## 2022-12-18 ENCOUNTER — Other Ambulatory Visit: Payer: Self-pay | Admitting: Physician Assistant

## 2022-12-25 ENCOUNTER — Telehealth: Payer: Self-pay | Admitting: Physician Assistant

## 2022-12-25 NOTE — Telephone Encounter (Signed)
Document FL2, to be filled out by provider. Patient requested to send it back via Fax within 5-days. Document is located in providers tray at front office.Please advise

## 2022-12-26 NOTE — Telephone Encounter (Signed)
Placed in provider box for review and signature.

## 2022-12-26 NOTE — Telephone Encounter (Signed)
Faxed paperwork as requested on paperwork, copy in desk drawer

## 2023-01-30 ENCOUNTER — Other Ambulatory Visit: Payer: Self-pay | Admitting: Physician Assistant

## 2023-02-01 ENCOUNTER — Other Ambulatory Visit: Payer: Self-pay | Admitting: Physician Assistant

## 2023-02-03 ENCOUNTER — Other Ambulatory Visit: Payer: Self-pay

## 2023-02-03 ENCOUNTER — Ambulatory Visit (INDEPENDENT_AMBULATORY_CARE_PROVIDER_SITE_OTHER): Payer: Medicare HMO | Admitting: Physician Assistant

## 2023-02-03 ENCOUNTER — Encounter: Payer: Self-pay | Admitting: Physician Assistant

## 2023-02-03 VITALS — BP 112/64 | HR 75 | Temp 97.5°F | Ht 64.75 in | Wt 171.6 lb

## 2023-02-03 DIAGNOSIS — E119 Type 2 diabetes mellitus without complications: Secondary | ICD-10-CM

## 2023-02-03 DIAGNOSIS — Z9989 Dependence on other enabling machines and devices: Secondary | ICD-10-CM | POA: Insufficient documentation

## 2023-02-03 DIAGNOSIS — M542 Cervicalgia: Secondary | ICD-10-CM | POA: Diagnosis not present

## 2023-02-03 DIAGNOSIS — M545 Low back pain, unspecified: Secondary | ICD-10-CM | POA: Diagnosis not present

## 2023-02-03 DIAGNOSIS — R935 Abnormal findings on diagnostic imaging of other abdominal regions, including retroperitoneum: Secondary | ICD-10-CM

## 2023-02-03 DIAGNOSIS — R790 Abnormal level of blood mineral: Secondary | ICD-10-CM

## 2023-02-03 DIAGNOSIS — N2889 Other specified disorders of kidney and ureter: Secondary | ICD-10-CM

## 2023-02-03 NOTE — Patient Instructions (Addendum)
Keep up the great work!  Labs today  Schedule for MRI abdomen to follow on your renal cysts  Referral to Dr. Venita Lick in regard to your neck and back pain   See you back this summer!

## 2023-02-03 NOTE — Progress Notes (Signed)
Patient ID: Danny Hunter, male    DOB: [birth year], 82 y.o.   MRN: [id number]   Assessment & Plan:  Controlled type 2 diabetes mellitus without complication, without long-term current use of insulin (HCC) -     Comprehensive metabolic panel -     Lipid panel -     Hemoglobin A1c  Right renal mass -     MR ABDOMEN W WO CONTRAST; Future  Abnormal MRI of abdomen -     MR ABDOMEN W WO CONTRAST; Future  Cervicalgia -     Ambulatory referral to Orthopedics  Lumbar pain -     Ambulatory referral to Orthopedics  Low ferritin -     CBC with Differential/Platelet -     IBC + Ferritin  Other specified disorders of kidney and ureter -     MR ABDOMEN W WO CONTRAST; Future   Labs today  Schedule for MRI abdomen to follow on your renal cysts  Referral to Dr. Venita Lick in regard to your neck and back pain      Return in about 6 months (around 08/04/2023) for recheck/follow-up.    Subjective:    Chief Complaint  Patient presents with   Medical Management of Chronic Issues    Pt in the office for 6 mon f/u; pt has no concerns to discuss, pt states all is well and things are going well for him    HPI Discussed the use of AI scribe software for clinical note transcription with the patient, who gave verbal consent to proceed.  History of Present Illness   Danny Hunter, a patient with a history of kidney cysts and anemia, presents with worsening back pain that is now affecting his legs. He has been managing the pain with hot water and exercise. He also reports a shooting pain in his neck that has been increasing in frequency. He mentions that he was told he had a "bad back" during a previous hospital stay but did not experience any pain at the time. He also notes that his son has severe back pain and is scheduled for surgery.  He mentions the possibility of moving out of state to be closer to his son, who has terminal cancer. He also discusses his own health insurance and the  potential need to change providers if he moves.  Danny Hunter is diligent about his health, walking at least 4000 steps a day and monitoring his diet to manage his diabetes. He mentions that he feels in better shape now than he was ten years ago. He also notes that he has not had any bleeding issues recently.   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   Tubular adenoma of colon    2011    Past Surgical History:  Procedure Laterality Date   CATARACT EXTRACTION  2019   SKIN CANCER EXCISION Right    ear   TOTAL HIP ARTHROPLASTY Left 09/04/2021   Procedure: TOTAL HIP ARTHROPLASTY ANTERIOR APPROACH;  Surgeon: Durene Romans, MD;  Location: [facility];  Service: Orthopedics;  Laterality: Left;   TOTAL SHOULDER ARTHROPLASTY  10/22/2011   Procedure: TOTAL  SHOULDER ARTHROPLASTY;  Surgeon: Mable Paris, MD;  Location: [facility] OR;  Service: Orthopedics;  Laterality: Right;  right total shoulder arthroplasty    Family History  Problem Relation Age of Onset   Cancer Mother        type unknown   Heart disease Father    Diabetes Father    Kidney cancer Son    Diabetes Paternal Aunt    Dementia Brother    Hypertension Brother    Hyperlipidemia Brother    Diabetes Brother     Social History   Tobacco Use   Smoking status: Never   Smokeless tobacco: Never  Vaping Use   Vaping status: Never Used  Substance Use Topics   Alcohol use: Yes    Comment: 1 drink a month   Drug use: No     Allergies  Allergen Reactions   Penicillin G Other (See Comments)    Childhood Reaction   Tolerated Cephalosporin Date: 09/05/21.    Review of Systems NEGATIVE UNLESS OTHERWISE INDICATED IN HPI      Objective:     BP 112/64 (BP Location: Left Arm, Patient  Position: Sitting, Cuff Size: Normal)   Pulse 75   Temp (!) 97.5 F (36.4 C) (Temporal)   Ht 5' 4.75" (1.645 m)   Wt 171 lb 9.6 oz (77.8 kg)   SpO2 97%   BMI 28.78 kg/m   Wt Readings from Last 3 Encounters:  02/03/23 171 lb 9.6 oz (77.8 kg)  09/19/22 170 lb (77.1 kg)  07/17/22 171 lb 4 oz (77.7 kg)    BP Readings from Last 3 Encounters:  02/03/23 112/64  09/19/22 128/80  07/17/22 118/70     Physical Exam Vitals and nursing note reviewed.  Constitutional:      Appearance: Normal appearance.  Eyes:     Extraocular Movements: Extraocular movements intact.     Conjunctiva/sclera: Conjunctivae normal.     Pupils: Pupils are equal, round, and reactive to light.  Cardiovascular:     Rate and Rhythm: Normal rate and regular rhythm.     Pulses: Normal pulses.     Heart sounds: No murmur heard. Pulmonary:     Effort: Pulmonary effort is normal. No respiratory distress.     Breath sounds: Normal breath sounds. No stridor. No wheezing or rhonchi.  Skin:    Findings: No rash.  Neurological:     General: No focal deficit present.     Mental Status: He is alert and oriented to person, place, and time.  Psychiatric:        Mood and Affect: Mood normal.         Jayant Kriz M Theodore Virgin, PA-C

## 2023-02-04 LAB — COMPREHENSIVE METABOLIC PANEL
ALT: 14 U/L (ref 0–53)
AST: 18 U/L (ref 0–37)
Albumin: 4.3 g/dL (ref 3.5–5.2)
Alkaline Phosphatase: 54 U/L (ref 39–117)
BUN: 17 mg/dL (ref 6–23)
CO2: 27 meq/L (ref 19–32)
Calcium: 10.5 mg/dL (ref 8.4–10.5)
Chloride: 101 meq/L (ref 96–112)
Creatinine, Ser: 1.24 mg/dL (ref 0.40–1.50)
GFR: 53.68 mL/min — ABNORMAL LOW (ref >60.00)
Glucose, Bld: 79 mg/dL (ref 70–99)
Potassium: 4.6 meq/L (ref 3.5–5.1)
Sodium: 137 meq/L (ref 135–145)
Total Bilirubin: 0.7 mg/dL (ref 0.2–1.2)
Total Protein: 6.8 g/dL (ref 6.0–8.3)

## 2023-02-04 LAB — CBC WITH DIFFERENTIAL/PLATELET
Basophils Absolute: 0 10*3/uL (ref 0.0–0.1)
Basophils Relative: 0.3 % (ref 0.0–3.0)
Eosinophils Absolute: 0.1 10*3/uL (ref 0.0–0.7)
Eosinophils Relative: 1.1 % (ref 0.0–5.0)
HCT: 45.2 % (ref 39.0–52.0)
Hemoglobin: 14.8 g/dL (ref 13.0–17.0)
Lymphocytes Relative: 13 % (ref 12.0–46.0)
Lymphs Abs: 1.1 10*3/uL (ref 0.7–4.0)
MCHC: 32.7 g/dL (ref 30.0–36.0)
MCV: 94.9 fL (ref 78.0–100.0)
Monocytes Absolute: 0.8 10*3/uL (ref 0.1–1.0)
Monocytes Relative: 9.3 % (ref 3.0–12.0)
Neutro Abs: 6.4 10*3/uL (ref 1.4–7.7)
Neutrophils Relative %: 76.3 % (ref 43.0–77.0)
Platelets: 215 10*3/uL (ref 150.0–400.0)
RBC: 4.76 Mil/uL (ref 4.22–5.81)
RDW: 14.3 % (ref 11.5–15.5)
WBC: 8.4 10*3/uL (ref 4.0–10.5)

## 2023-02-04 LAB — LIPID PANEL
Cholesterol: 139 mg/dL (ref 0–200)
HDL: 57.3 mg/dL (ref >39.00)
LDL Cholesterol: 62 mg/dL (ref 0–99)
NonHDL: 81.69
Total CHOL/HDL Ratio: 2
Triglycerides: 96 mg/dL (ref 0.0–149.0)
VLDL: 19.2 mg/dL (ref 0.0–40.0)

## 2023-02-04 LAB — IBC + FERRITIN
Ferritin: 17.5 ng/mL — ABNORMAL LOW (ref 22.0–322.0)
Iron: 91 ug/dL (ref 42–165)
Saturation Ratios: 27.4 % (ref 20.0–50.0)
TIBC: 331.8 ug/dL (ref 250.0–450.0)
Transferrin: 237 mg/dL (ref 212.0–360.0)

## 2023-02-04 LAB — HEMOGLOBIN A1C: Hgb A1c MFr Bld: 6.7 % — ABNORMAL HIGH (ref 4.6–6.5)

## 2023-02-24 ENCOUNTER — Other Ambulatory Visit: Payer: Self-pay

## 2023-02-24 ENCOUNTER — Telehealth: Payer: Self-pay | Admitting: Physician Assistant

## 2023-02-24 MED ORDER — ATORVASTATIN CALCIUM 40 MG PO TABS: 40.0000 mg | ORAL_TABLET | Freq: Every day | ORAL | 0 refills | Status: DC

## 2023-02-24 NOTE — Telephone Encounter (Signed)
Prescription Request  02/24/2023  LOV: 02/03/2023  What is the name of the medication or equipment?  atorvastatin (LIPITOR) 40 MG tablet     Have you contacted your pharmacy to request a refill? Yes   Which pharmacy would you like this sent to?    [id number] [place], [place] - [address] AT [facility] [address] [facility] [address] Phone: [phone]  Fax: [phone]      Patient notified that their request is being sent to the clinical staff for review and that they should receive a response within 2 business days.   Please advise at Mobile [phone] (mobile)

## 2023-02-24 NOTE — Telephone Encounter (Signed)
Rx sent to pharmacy   

## 2023-02-26 ENCOUNTER — Telehealth: Payer: Self-pay | Admitting: Physician Assistant

## 2023-02-26 ENCOUNTER — Telehealth: Payer: Self-pay

## 2023-02-26 DIAGNOSIS — Z0279 Encounter for issue of other medical certificate: Secondary | ICD-10-CM

## 2023-02-26 NOTE — Telephone Encounter (Signed)
Copied from CRM [id number]. Topic: Clinical - Home Health Verbal Orders >> Feb 26, 2023 11:30 AM Adele Barthel wrote: Caller/Agency: [facility] Number: Allena Katz, # [phone] Service Requested: Patient and his wife will be moving into the facility and forms are being faxed over that the patient's provider will need to complete before they can move in.  Frequency: N/a Any new concerns about the patient? No  FYI.Marland KitchenMarland KitchenNothing further needed at this time

## 2023-02-26 NOTE — Telephone Encounter (Signed)
Received faxed document from assisted living , to be filled out by provider. Patient requested to send it back via Fax within ASAP. Document is located in providers tray at front office.Please advise

## 2023-02-26 NOTE — Telephone Encounter (Signed)
Placed in provider basket for review.

## 2023-02-28 NOTE — Telephone Encounter (Signed)
Patient would like to know if an appt will be needed for forms to be completed . Also states he has an MRI scheduled for Feb 4th , since he will be moving out of state he would like to know if this is something he can cancel or if it is absolutely necessary.

## 2023-02-28 NOTE — Telephone Encounter (Signed)
Please see pt msg and advise, forms should be completed today

## 2023-02-28 NOTE — Telephone Encounter (Signed)
Returned pt call and advised PCP recommendations; pt states he cancelled the appt for MRI about 15 mins ago, his insurance is changed and only good for [place]; pt unable to complete before his move. Advised I would let PCP know. Pt verbalized understanding

## 2023-02-28 NOTE — Telephone Encounter (Signed)
Copied from CRM [id number]. Topic: Clinical - Lab/Test Results >> Feb 28, 2023  9:51 AM Danny Hunter wrote: Reason for CRM: patient called in stating that he is schedule for an MRI on 03/11/2023,he said that he is moving away and would like to now if he needs this MRI done?

## 2023-03-03 ENCOUNTER — Telehealth: Payer: Self-pay

## 2023-03-03 NOTE — Telephone Encounter (Signed)
Copied from CRM [id number]. Topic: General - Other >> Feb 28, 2023  4:15 PM Fredrich Romans wrote: Reason for CRM: Reuel Boom from  [employer] at [facility] called in stating that they sent over 7 pages to be completed  provider,however when received back he only received 2 pages back. He would like to know if the information could be refaxed with all of the 7. Fax:[phone]  Requested paperwork re-faxed this morning with confirmation all pages were received. Nothing further needed at this time.

## 2023-03-11 ENCOUNTER — Other Ambulatory Visit: Payer: Medicare HMO

## 2023-03-19 ENCOUNTER — Ambulatory Visit: Payer: Medicare HMO

## 2023-03-25 ENCOUNTER — Ambulatory Visit (INDEPENDENT_AMBULATORY_CARE_PROVIDER_SITE_OTHER): Payer: Medicare Other

## 2023-03-25 VITALS — Ht 65.0 in | Wt 160.0 lb

## 2023-03-25 DIAGNOSIS — Z0000 Encounter for general adult medical examination without abnormal findings: Secondary | ICD-10-CM | POA: Diagnosis not present

## 2023-03-25 NOTE — Progress Notes (Signed)
 Subjective:   Danny Hunter is a 83 y.o. who presents for Medicare Wellness preventive examination.  Visit Complete: Virtual I connected with  Danny Hunter on 03/25/23 by a audio enabled telemedicine application and verified that I am speaking with the correct person using two identifiers.  Patient Location: Home  Provider Location: Office/Clinic  I discussed the limitations of evaluation and management by telemedicine. The patient expressed understanding and agreed to proceed.  Vital Signs: Because this visit was a virtual/telehealth visit, some criteria may be missing or patient reported. Any vitals not documented were not able to be obtained and vitals that have been documented are patient reported.    AWV Questionnaire: No: Patient Medicare AWV questionnaire was not completed prior to this visit.  Cardiac Risk Factors include: advanced age (>37men, >76 women);dyslipidemia;diabetes mellitus;male gender;hypertension     Objective:    Today's Vitals   03/25/23 1528  Weight: 160 lb (72.6 kg)  Height: 5\' 5"  (1.651 m)   Body mass index is 26.63 kg/m.     03/25/2023    3:31 PM 01/11/2022   11:00 AM 01/09/2022    9:00 AM 10/29/2021    2:43 PM 09/04/2021    4:40 PM 08/23/2021   11:07 AM 09/14/2020    1:55 PM  Advanced Directives  Does Patient Have a Medical Advance Directive? Yes Yes Yes Yes Yes Yes Yes  Type of Estate agent of [place];Living will Healthcare Power of [place];Living will Healthcare Power of [place];Living will Healthcare Power of [place];Living will Healthcare Power of [place];Living will  Healthcare Power of Attorney  Does patient want to make changes to medical advance directive? No - Patient declined No - Patient declined  No - Patient declined No - Patient declined Yes (MAU/Ambulatory/Procedural Areas - Information given)   Copy of Healthcare Power of Attorney in Chart? Yes - validated most recent copy scanned in chart (See row  information) No - copy requested No - copy requested Yes - validated most recent copy scanned in chart (See row information)   No - copy requested  Would patient like information on creating a medical advance directive?  No - Patient declined No - Patient declined        Current Medications (verified) Outpatient Encounter Medications as of 03/25/2023  Medication Sig   amLODipine (NORVASC) 5 MG tablet Take 1 tablet by mouth once daily   atorvastatin (LIPITOR) 40 MG tablet Take 1 tablet (40 mg total) by mouth daily.   finasteride (PROSCAR) 5 MG tablet Take 1 tablet (5 mg total) by mouth daily.   metFORMIN (GLUCOPHAGE) 500 MG tablet TAKE 2 TABLETS BY MOUTH TWICE DAILY WITH A MEAL   sildenafil (REVATIO) 20 MG tablet Take 20 mg by mouth daily as needed.   silodosin (RAPAFLO) 8 MG CAPS capsule Take 1 capsule (8 mg total) by mouth daily.   spironolactone (ALDACTONE) 25 MG tablet Take 1 tablet by mouth once daily   [DISCONTINUED] guaiFENesin-codeine (ROBITUSSIN AC) 100-10 MG/5ML syrup Take 5 mLs by mouth 3 (three) times daily as needed for cough. (Patient not taking: Reported on 02/03/2023)   No facility-administered encounter medications on file as of 03/25/2023.    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   Procedure: TOTAL HIP ARTHROPLASTY ANTERIOR APPROACH;  Surgeon: Durene Romans, MD;  Location: [facility] ORS;  Service: Orthopedics;  Laterality: Left;   TOTAL SHOULDER ARTHROPLASTY  10/22/2011   Procedure: TOTAL SHOULDER  ARTHROPLASTY;  Surgeon: Mable Paris, MD;  Location: [facility] OR;  Service: Orthopedics;  Laterality: Right;  right total shoulder arthroplasty   Family History  Problem Relation Age of Onset   Cancer Mother        type unknown   Heart disease Father    Diabetes Father    Kidney cancer Son    Diabetes Paternal Aunt    Dementia Brother    Hypertension Brother    Hyperlipidemia Brother    Diabetes Brother    Social History   Socioeconomic History   Marital status: Married    Spouse name: Not on file   Number of children: 2   Years of education: Not on file   Highest education level: Not on file  Occupational History   Occupation: Retired  Tobacco Use   Smoking status: Never   Smokeless tobacco: Never  Vaping Use   Vaping status: Never Used  Substance and Sexual Activity   Alcohol use: Yes    Comment: 1 drink a month   Drug use: No   Sexual activity: Not Currently  Other Topics Concern   Not on file  Social History Narrative   Left handed   Social Drivers of Health   Financial Resource Strain: Low Risk  (03/25/2023)   Overall Financial Resource Strain (CARDIA)    Difficulty of Paying Living Expenses: Not hard at all  Food Insecurity: No Food Insecurity (03/25/2023)   Hunger Vital Sign    Worried About Running Out of Food in the Last Year: Never true    Ran Out of Food in the Last Year: Never true  Transportation Needs: No Transportation Needs (03/25/2023)   PRAPARE - Administrator, Civil Service (Medical): No    Lack of Transportation (Non-Medical): No  Physical Activity: Sufficiently Active (03/25/2023)   Exercise Vital Sign    Days of Exercise per Week: 5 days    Minutes of Exercise per Session: 60 min  Stress: No Stress Concern Present (03/25/2023)   [employer] of Occupational Health - Occupational Stress Questionnaire    Feeling of Stress : Not at all  Social Connections: Moderately Integrated (03/25/2023)   Social Connection and  Isolation Panel [NHANES]    Frequency of Communication with Friends and Family: More than three times a week    Frequency of Social Gatherings with Friends and Family: More than three times a week    Attends Religious Services: Never    Database administrator or Organizations: Yes    Attends Engineer, structural: 1 to 4 times per year    Marital Status: Married    Tobacco Counseling Counseling given: Not Answered    Clinical Intake:  Pre-visit preparation completed: Yes  Pain : No/denies pain     BMI - recorded: 26.63 Nutritional Status: BMI 25 -29 Overweight Nutritional Risks: None Diabetes: Yes CBG done?: No  Did pt. bring in CBG monitor from home?: No  How often do you need to have someone help you when you read instructions, pamphlets, or other written materials from your doctor or pharmacy?: 1 - Never  Interpreter Needed?: No  Information entered by :: Lanier Ensign, LPN   Activities of Daily Living     03/25/2023    3:30 PM  In your present state of health, do you have any difficulty performing the following activities:  Hearing? 0  Vision? 0  Difficulty concentrating or making decisions? 0  Walking or climbing stairs? 0  Dressing or bathing? 0  Doing errands, shopping? 0  Preparing Food and eating ? N  Using the Toilet? N  In the past six months, have you accidently leaked urine? N  Do you have problems with loss of bowel control? N  Managing your Medications? N  Managing your Finances? N  Housekeeping or managing your Housekeeping? N    Patient Care Team: Allwardt, Crist Infante, PA-C as PCP - General (Physician Assistant) Meriam Sprague, MD (Inactive) as PCP - Cardiology (Cardiology) Pa, [facility] (Ophthalmology)  Indicate any recent Medical Services you may have received from other than [facility] providers in the past year (date may be approximate).     Assessment:   This is a routine wellness examination for  [facility].  Hearing/Vision screen Hearing Screening - Comments:: Pt denies any hearing issues  Vision Screening - Comments:: Pt follows up with Dr Youlanda Roys for annual eye exams    Goals Addressed             This Visit's Progress    Patient Stated       Patient stated        Depression Screen     03/25/2023    3:31 PM 09/19/2022   11:24 AM 05/20/2022    7:36 AM 03/15/2022    9:26 AM 01/09/2022    8:04 AM 10/29/2021    2:42 PM 02/22/2021    8:55 AM  PHQ 2/9 Scores  PHQ - 2 Score 0 0 0 0 0 0 0  PHQ- 9 Score    0       Fall Risk     03/25/2023    3:33 PM 09/19/2022   11:24 AM 05/20/2022    7:36 AM 03/15/2022    9:26 AM 01/09/2022    8:03 AM  Fall Risk   Falls in the past year? 0 0 0 0 0  Number falls in past yr: 0 0 0 0 0  Injury with Fall? 0 0 0 0 0  Risk for fall due to : No Fall Risks No Fall Risks No Fall Risks No Fall Risks No Fall Risks  Follow up Falls prevention discussed;Falls evaluation completed Falls evaluation completed Falls evaluation completed Falls evaluation completed Falls evaluation completed    MEDICARE RISK AT HOME:  Medicare Risk at Home Any stairs in or around the home?: Yes If so, are there any without handrails?: No Home free of loose throw rugs in walkways, pet beds, electrical cords, etc?: Yes Adequate lighting in your home to reduce risk of falls?: Yes Life alert?: Yes Use of a cane, walker or w/c?: No Grab bars in the bathroom?: Yes Shower chair or bench in shower?: Yes Elevated toilet seat or a handicapped toilet?: Yes  TIMED UP AND GO:  Was the test performed?  No  Cognitive Function: 6CIT completed        03/25/2023  3:33 PM 10/29/2021    2:46 PM 09/14/2020    1:59 PM  6CIT Screen  What Year? 0 points 0 points 0 points  What month? 0 points 0 points 0 points  What time? 0 points 0 points 0 points  Count back from 20 0 points 0 points 0 points  Months in reverse 0 points 0 points 0 points  Repeat phrase 0 points 4 points 0  points  Total Score 0 points 4 points 0 points    Immunizations Immunization History  Administered Date(s) Administered   Fluad Quad(high Dose 65+) 01/31/2021   Influenza, High Dose Seasonal PF 10/22/2013, 01/03/2015, 10/14/2019   Influenza, Quadrivalent, Recombinant, Inj, Pf 12/02/2017, 10/09/2018, 11/11/2019   Influenza-Unspecified 10/22/2013, 10/14/2019, 11/12/2021   Moderna Sars-Covid-2 Vaccination 02/18/2019, 03/18/2019, 12/23/2019   Pneumococcal Conjugate,unspecified 12/14/2019   Pneumococcal Conjugate-13 03/22/2014, 12/14/2019   Pneumococcal Polysaccharide-23 07/28/2017   Zoster Recombinant(Shingrix) 10/10/2020, 12/21/2020    Screening Tests Health Maintenance  Topic Date Due   Diabetic kidney evaluation - Urine ACR  Never done   FOOT EXAM  12/04/2022   OPHTHALMOLOGY EXAM  12/04/2022   INFLUENZA VACCINE  05/05/2023 (Originally 09/05/2022)   HEMOGLOBIN A1C  08/04/2023   Diabetic kidney evaluation - eGFR measurement  02/03/2024   Medicare Annual Wellness (AWV)  03/24/2024   Pneumonia Vaccine 75+ Years old  Completed   Zoster Vaccines- Shingrix  Completed   HPV VACCINES  Aged Out   DTaP/Tdap/Td  Discontinued   COVID-19 Vaccine  Discontinued    Health Maintenance  Health Maintenance Due  Topic Date Due   Diabetic kidney evaluation - Urine ACR  Never done   FOOT EXAM  12/04/2022   OPHTHALMOLOGY EXAM  12/04/2022   Health Maintenance Items Addressed:    Additional Screening:  Vision Screening: Recommended annual ophthalmology exams for early detection of glaucoma and other disorders of the eye.  Dental Screening: Recommended annual dental exams for proper oral hygiene  Community Resource Referral / Chronic Care Management: CRR required this visit?  No   CCM required this visit?  No     Plan:     I have personally reviewed and noted the following in the patient's chart:   Medical and social history Use of alcohol, tobacco or illicit drugs  Current  medications and supplements including opioid prescriptions. Patient is not currently taking opioid prescriptions. Functional ability and status Nutritional status Physical activity Advanced directives List of other physicians Hospitalizations, surgeries, and ER visits in previous 12 months Vitals Screenings to include cognitive, depression, and falls Referrals and appointments  In addition, I have reviewed and discussed with patient certain preventive protocols, quality metrics, and best practice recommendations. A written personalized care plan for preventive services as well as general preventive health recommendations were provided to patient.     Marzella Schlein, LPN   5/[id number]/4696   After Visit Summary: (MyChart) Due to this being a telephonic visit, the after visit summary with patients personalized plan was offered to patient via MyChart   Notes: Nothing significant to report at this time.

## 2023-03-25 NOTE — Patient Instructions (Signed)
 Mr. Danny Hunter , Thank you for taking time to come for your Medicare Wellness Visit. I appreciate your ongoing commitment to your health goals. Please review the following plan we discussed and let me know if I can assist you in the future.   Referrals/Orders/Follow-Ups/Clinician Recommendations: maintain health and activity   This is a list of the screening recommended for you and due dates:  Health Maintenance  Topic Date Due   Yearly kidney health urinalysis for diabetes  Never done   Medicare Annual Wellness Visit  10/30/2022   Complete foot exam   12/04/2022   Eye exam for diabetics  12/04/2022   Flu Shot  05/05/2023*   Hemoglobin A1C  08/04/2023   Yearly kidney function blood test for diabetes  02/03/2024   Pneumonia Vaccine  Completed   Zoster (Shingles) Vaccine  Completed   HPV Vaccine  Aged Out   DTaP/Tdap/Td vaccine  Discontinued   COVID-19 Vaccine  Discontinued  *Topic was postponed. The date shown is not the original due date.    Advanced directives: (In Chart) A copy of your advanced directives are scanned into your chart should your provider ever need it.  Next Medicare Annual Wellness Visit scheduled for next year: Yes

## 2023-05-25 ENCOUNTER — Other Ambulatory Visit: Payer: Self-pay | Admitting: Physician Assistant

## 2023-06-25 ENCOUNTER — Telehealth: Payer: Self-pay

## 2023-06-25 NOTE — Telephone Encounter (Signed)
 Copied from CRM [id number]. Topic: General - Other >> Jun 24, 2023  2:11 PM Dorisann Garre T wrote: Reason for CRM: patient is needing a call back regarding a bill he stated he reached out to billing AND WAS TOLD TO CONTACT HIS OLD PCP he would like a call back  Can you ladies look into patient concern, pt stating he was told by [facility] billing office to contact our office in regards to his bill received.

## 2023-07-01 NOTE — Telephone Encounter (Signed)
 I have requested charge correction to void fee for DOS 02/26/23. I have advise patient to disregard bill.

## 2023-08-04 ENCOUNTER — Ambulatory Visit: Payer: Medicare HMO | Admitting: Physician Assistant

## 2023-08-05 ENCOUNTER — Ambulatory Visit: Admitting: Physician Assistant

## 2024-04-01 ENCOUNTER — Ambulatory Visit: Payer: Medicare Other
